# Patient Record
Sex: Male | Born: 1985 | Race: White | Hispanic: No | Marital: Single | Smoking: Never smoker
Health system: Southern US, Community
[De-identification: ages and names within clinical notes are randomized; demographics above are authoritative.]

## PROBLEM LIST (undated history)

## (undated) DIAGNOSIS — R569 Unspecified convulsions: Secondary | ICD-10-CM

## (undated) DIAGNOSIS — D696 Thrombocytopenia, unspecified: Secondary | ICD-10-CM

## (undated) DIAGNOSIS — K709 Alcoholic liver disease, unspecified: Secondary | ICD-10-CM

## (undated) DIAGNOSIS — T7840XA Allergy, unspecified, initial encounter: Secondary | ICD-10-CM

## (undated) DIAGNOSIS — K219 Gastro-esophageal reflux disease without esophagitis: Secondary | ICD-10-CM

## (undated) DIAGNOSIS — R7401 Elevation of levels of liver transaminase levels: Secondary | ICD-10-CM

## (undated) DIAGNOSIS — K859 Acute pancreatitis without necrosis or infection, unspecified: Secondary | ICD-10-CM

## (undated) DIAGNOSIS — G40909 Epilepsy, unspecified, not intractable, without status epilepticus: Secondary | ICD-10-CM

## (undated) DIAGNOSIS — K759 Inflammatory liver disease, unspecified: Secondary | ICD-10-CM

## (undated) DIAGNOSIS — R740 Nonspecific elevation of levels of transaminase and lactic acid dehydrogenase [LDH]: Secondary | ICD-10-CM

## (undated) DIAGNOSIS — F419 Anxiety disorder, unspecified: Secondary | ICD-10-CM

## (undated) HISTORY — DX: Allergy, unspecified, initial encounter: T78.40XA

## (undated) HISTORY — DX: Epilepsy, unspecified, not intractable, without status epilepticus: G40.909

## (undated) HISTORY — DX: Anxiety disorder, unspecified: F41.9

## (undated) HISTORY — PX: WISDOM TOOTH EXTRACTION: SHX21

---
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
## 2006-09-22 ENCOUNTER — Emergency Department (HOSPITAL_COMMUNITY): Admission: EM | Admit: 2006-09-22 | Discharge: 2006-09-22 | Payer: Self-pay | Admitting: Emergency Medicine

## 2011-05-10 ENCOUNTER — Encounter: Payer: Self-pay | Admitting: *Deleted

## 2011-05-10 ENCOUNTER — Ambulatory Visit (INDEPENDENT_AMBULATORY_CARE_PROVIDER_SITE_OTHER): Payer: BC Managed Care – PPO

## 2011-05-10 DIAGNOSIS — R569 Unspecified convulsions: Secondary | ICD-10-CM

## 2011-05-10 DIAGNOSIS — G40909 Epilepsy, unspecified, not intractable, without status epilepticus: Secondary | ICD-10-CM | POA: Insufficient documentation

## 2011-05-10 DIAGNOSIS — Z0000 Encounter for general adult medical examination without abnormal findings: Secondary | ICD-10-CM

## 2011-05-18 ENCOUNTER — Ambulatory Visit (INDEPENDENT_AMBULATORY_CARE_PROVIDER_SITE_OTHER): Payer: BC Managed Care – PPO

## 2011-05-18 DIAGNOSIS — R6889 Other general symptoms and signs: Secondary | ICD-10-CM

## 2011-05-18 DIAGNOSIS — R569 Unspecified convulsions: Secondary | ICD-10-CM

## 2011-06-28 ENCOUNTER — Telehealth: Payer: Self-pay

## 2011-06-28 NOTE — Telephone Encounter (Signed)
.  [facility] FROM [place] STATES THEY NEED RECORDS FAXED ON PT. PLEASE FAX TO [phone] AND THE PHONE NUMBER IS [phone]

## 2011-06-28 NOTE — Telephone Encounter (Signed)
Records sent to [facility]. Pt was referred by Dr Cleta Alberts. Spoke with Karlyne Greenspan and records received.

## 2011-06-29 ENCOUNTER — Ambulatory Visit: Payer: Self-pay | Admitting: Cardiology

## 2011-11-20 ENCOUNTER — Emergency Department (HOSPITAL_COMMUNITY)
Admission: EM | Admit: 2011-11-20 | Discharge: 2011-11-20 | Disposition: A | Discharge status: Home / Self Care | Payer: BC Managed Care – PPO | Attending: Emergency Medicine | Admitting: Emergency Medicine

## 2011-11-20 ENCOUNTER — Encounter (HOSPITAL_COMMUNITY): Payer: Self-pay | Admitting: Emergency Medicine

## 2011-11-20 DIAGNOSIS — R569 Unspecified convulsions: Secondary | ICD-10-CM | POA: Insufficient documentation

## 2011-11-20 LAB — POCT I-STAT, CHEM 8
BUN: 10 mg/dL (ref 6–23)
Calcium, Ion: 1.14 mmol/L (ref 1.12–1.23)
Chloride: 101 mEq/L (ref 96–112)
Creatinine, Ser: 0.8 mg/dL (ref 0.50–1.35)
Glucose, Bld: 170 mg/dL — ABNORMAL HIGH (ref 70–99)
HCT: 42 % (ref 39.0–52.0)
Hemoglobin: 14.3 g/dL (ref 13.0–17.0)
Potassium: 3.4 mEq/L — ABNORMAL LOW (ref 3.5–5.1)
Sodium: 138 mEq/L (ref 135–145)
TCO2: 20 mmol/L (ref 0–100)

## 2011-11-20 NOTE — ED Notes (Signed)
ZOX:[id number]> Expected date:<BR> Expected time:<BR> Means of arrival:<BR> Comments:<BR> 26 yo male/seizure

## 2011-11-20 NOTE — ED Notes (Signed)
Seizure described as lasting 2 minutes, upper body had grand mal type motor activity, lower body petit mal type motor activity. No incontinence, short post-ictal period, short period of confusion. Was clear when loaded into ambulance. Pt has a hand tremor which is unrelated to seizure activity.

## 2011-11-20 NOTE — ED Notes (Signed)
Immediately PTA pt had seizure at place of employment. Endorses hx of seizures w/ first occuring Aug 2012 another Dec 2012. Takes only Keppra for seizures and has telephone programed to remind him when to take meds. CBG on scene 153, BP-132/94, P-112, O2 SAT 99% on 2L/.

## 2011-11-20 NOTE — ED Notes (Signed)
Discharge instructions reviewed w/ pt., verbalizes understanding. No prescriptions provided at discharge. 

## 2011-11-21 NOTE — ED Notes (Signed)
Pt called to get a "return to work note".  I instructed him to follow up with his neurologist or come back to ER for MD eval to return to work.

## 2011-11-22 ENCOUNTER — Telehealth: Payer: Self-pay

## 2011-11-22 NOTE — Telephone Encounter (Signed)
DR DAUB,   PTS MOM CALLED TO SAY SON NEEDS LETTER STATING IT IS OK FOR HIM TO WORK SINCE HAVING SEIZURE??   BEST PHONE [phone]

## 2011-11-23 NOTE — Telephone Encounter (Signed)
The patient's mother at [phone] and ask her to have Oak come in to see me whenever I'm here and I can talk to him a whatever, documentation he needs for work regarding his seizure disorder.

## 2011-11-23 NOTE — ED Provider Notes (Addendum)
History    26 year old male with seizure. Patient's for seizure in the past year. No prior seizure history before this. Is on Keppra and reports compliance. An hour prior to arrival patient was witnessed to have generalized tonic-clonic movements. Last approximately 2-3 minutes. Tired and confused afterwards but currently back to his baseline per family. Denies ingestion. No incontinence or tongue biting. Previous evaluation including MRI and EEG without clear etiology. Has neurologist.  CSN: [id number]  Arrival date & time 11/20/11  2032   First MD Initiated Contact with Patient 11/20/11 2108      Chief Complaint  Patient presents with  . Seizures    (Consider location/radiation/quality/duration/timing/severity/associated sxs/prior treatment) HPI  Past Medical History  Diagnosis Date  . Seizure disorder     History reviewed. No pertinent past surgical history.  Family History  Problem Relation Age of Onset  . Heart disease      cardiovascular disease  . Cancer      History  Substance Use Topics  . Smoking status: Never Smoker   . Smokeless tobacco: Not on file  . Alcohol Use: Yes     socially      Review of Systems   Review of symptoms negative unless otherwise noted in HPI.   Allergies  Oxycodone  Home Medications   Current Outpatient Rx  Name Route Sig Dispense Refill  . LEVETIRACETAM 500 MG PO TABS Oral Take 500 mg by mouth 2 (two) times daily.    . ADULT MULTIVITAMIN W/MINERALS CH Oral Take 1 tablet by mouth daily.      BP 133/91  Pulse 99  Temp 99.8 F (37.7 C) (Oral)  Resp 23  SpO2 99%  Physical Exam  Nursing note and vitals reviewed. Constitutional: He is oriented to person, place, and time. He appears well-developed and well-nourished. No distress.  HENT:  Head: Normocephalic and atraumatic.  Eyes: Conjunctivae and EOM are normal. Pupils are equal, round, and reactive to light. Right eye exhibits no discharge. Left eye exhibits no  discharge.  Neck: Normal range of motion. Neck supple.  Cardiovascular: Normal rate, regular rhythm and normal heart sounds.  Exam reveals no gallop and no friction rub.   No murmur heard. Pulmonary/Chest: Effort normal and breath sounds normal. No respiratory distress.  Abdominal: Soft. He exhibits no distension. There is no tenderness.  Musculoskeletal: He exhibits no edema and no tenderness.  Neurological: He is alert and oriented to person, place, and time. No cranial nerve deficit. He exhibits normal muscle tone. Coordination normal.       Good finger to nose testing bilaterally. Gait is steady.  Skin: Skin is warm and dry.  Psychiatric: He has a normal mood and affect. His behavior is normal. Thought content normal.    ED Course  Procedures (including critical care time)  Labs Reviewed  POCT I-STAT, CHEM 8 - Abnormal; Notable for the following:    Potassium 3.4 (*)     Glucose, Bld 170 (*)     All other components within normal limits  LAB REPORT - SCANNED   No results found.  EKG:  Rhythm: normal sinus Rate: 91 Axis: normal Intervals: normal ST segments: normal  1. Seizure       MDM  26yM with seizure. Back to baseline. Nonfocal neuro exam. Afebrile and well appearing. Lytes ok.         Raeford Razor, MD 11/23/11 2536  Raeford Razor, MD 12/18/11 1255

## 2012-03-14 ENCOUNTER — Other Ambulatory Visit: Payer: Self-pay | Admitting: Emergency Medicine

## 2012-03-14 NOTE — Telephone Encounter (Signed)
Patients chart is at the nurses station in the pa pool pile.  [facility] [id number]

## 2012-03-14 NOTE — Telephone Encounter (Signed)
Please pull paper chart.  

## 2012-04-28 ENCOUNTER — Other Ambulatory Visit: Payer: Self-pay | Admitting: Emergency Medicine

## 2012-04-29 NOTE — Telephone Encounter (Signed)
This patient's paper chart is reviewed. Last visit here was 05/18/2011, CPE 05/10/2011.  The patient was referred to neurology at [facility] and cardiology. WE did not authorize request to refill this medication 03/14/2012.  Please call patient and advise him that we cannot refill this medication.  He needs to see a neurologist.  We is welcome to come back in and discuss this if he needs help finding a specialist.

## 2012-04-30 NOTE — Telephone Encounter (Signed)
Called pt to advise but our contact numbers were not accurate denial sent to pharmacy

## 2012-05-09 ENCOUNTER — Emergency Department (HOSPITAL_COMMUNITY): Payer: BC Managed Care – PPO

## 2012-05-09 ENCOUNTER — Inpatient Hospital Stay (HOSPITAL_COMMUNITY)
Admission: EM | Admit: 2012-05-09 | Discharge: 2012-05-12 | DRG: 204 | Disposition: A | Discharge status: Home / Self Care | Payer: BC Managed Care – PPO | Attending: Internal Medicine | Admitting: Internal Medicine

## 2012-05-09 ENCOUNTER — Encounter (HOSPITAL_COMMUNITY): Payer: Self-pay

## 2012-05-09 DIAGNOSIS — F1010 Alcohol abuse, uncomplicated: Secondary | ICD-10-CM | POA: Diagnosis present

## 2012-05-09 DIAGNOSIS — D6959 Other secondary thrombocytopenia: Secondary | ICD-10-CM | POA: Diagnosis not present

## 2012-05-09 DIAGNOSIS — F10939 Alcohol use, unspecified with withdrawal, unspecified: Secondary | ICD-10-CM | POA: Diagnosis present

## 2012-05-09 DIAGNOSIS — K709 Alcoholic liver disease, unspecified: Secondary | ICD-10-CM | POA: Diagnosis present

## 2012-05-09 DIAGNOSIS — F10239 Alcohol dependence with withdrawal, unspecified: Secondary | ICD-10-CM

## 2012-05-09 DIAGNOSIS — F1020 Alcohol dependence, uncomplicated: Secondary | ICD-10-CM | POA: Diagnosis present

## 2012-05-09 DIAGNOSIS — K219 Gastro-esophageal reflux disease without esophagitis: Secondary | ICD-10-CM | POA: Diagnosis present

## 2012-05-09 DIAGNOSIS — Z885 Allergy status to narcotic agent status: Secondary | ICD-10-CM

## 2012-05-09 DIAGNOSIS — G40909 Epilepsy, unspecified, not intractable, without status epilepticus: Secondary | ICD-10-CM | POA: Diagnosis present

## 2012-05-09 DIAGNOSIS — D696 Thrombocytopenia, unspecified: Secondary | ICD-10-CM | POA: Diagnosis present

## 2012-05-09 DIAGNOSIS — Z79899 Other long term (current) drug therapy: Secondary | ICD-10-CM

## 2012-05-09 DIAGNOSIS — K859 Acute pancreatitis without necrosis or infection, unspecified: Principal | ICD-10-CM | POA: Diagnosis present

## 2012-05-09 DIAGNOSIS — K560 Paralytic ileus: Secondary | ICD-10-CM | POA: Diagnosis not present

## 2012-05-09 HISTORY — DX: Unspecified convulsions: R56.9

## 2012-05-09 LAB — COMPREHENSIVE METABOLIC PANEL
AST: 215 U/L — ABNORMAL HIGH (ref 0–37)
Albumin: 3.9 g/dL (ref 3.5–5.2)
Calcium: 8.9 mg/dL (ref 8.4–10.5)
Chloride: 93 mEq/L — ABNORMAL LOW (ref 96–112)
Creatinine, Ser: 0.66 mg/dL (ref 0.50–1.35)

## 2012-05-09 LAB — URINALYSIS, ROUTINE W REFLEX MICROSCOPIC
Glucose, UA: NEGATIVE mg/dL
Leukocytes, UA: NEGATIVE
pH: 5.5 (ref 5.0–8.0)

## 2012-05-09 LAB — CBC
MCH: 37.4 pg — ABNORMAL HIGH (ref 26.0–34.0)
MCV: 98.2 fL (ref 78.0–100.0)
Platelets: 206 10*3/uL (ref 150–400)
RDW: 12.2 % (ref 11.5–15.5)
WBC: 15.9 10*3/uL — ABNORMAL HIGH (ref 4.0–10.5)

## 2012-05-09 LAB — HEMOGLOBIN A1C: Hgb A1c MFr Bld: 5.2 % (ref <5.7)

## 2012-05-09 LAB — GLUCOSE, CAPILLARY

## 2012-05-09 MED ORDER — ADULT MULTIVITAMIN W/MINERALS CH
1.0000 | ORAL_TABLET | Freq: Every day | ORAL | Status: DC
  Administered 2012-05-09 – 2012-05-12 (×4): 1 via ORAL
  Filled 2012-05-09 (×4): qty 1

## 2012-05-09 MED ORDER — MORPHINE SULFATE 2 MG/ML IJ SOLN
2.0000 mg | INTRAMUSCULAR | Status: DC | PRN
  Administered 2012-05-09 – 2012-05-10 (×5): 2 mg via INTRAVENOUS
  Filled 2012-05-09 (×5): qty 1

## 2012-05-09 MED ORDER — FENTANYL CITRATE 0.05 MG/ML IJ SOLN
50.0000 ug | Freq: Once | INTRAMUSCULAR | Status: AC
  Administered 2012-05-09: 50 ug via INTRAVENOUS
  Filled 2012-05-09: qty 2

## 2012-05-09 MED ORDER — ONDANSETRON HCL 4 MG/2ML IJ SOLN: 4.0000 mg | Freq: Four times a day (QID) | INTRAMUSCULAR | Status: DC | PRN

## 2012-05-09 MED ORDER — LORAZEPAM 2 MG/ML IJ SOLN
INTRAMUSCULAR | Status: AC
  Administered 2012-05-09: 12:00:00
  Filled 2012-05-09: qty 1

## 2012-05-09 MED ORDER — SODIUM CHLORIDE 0.9 % IV BOLUS (SEPSIS)
1000.0000 mL | Freq: Once | INTRAVENOUS | Status: AC
  Administered 2012-05-09: 1000 mL via INTRAVENOUS

## 2012-05-09 MED ORDER — ONDANSETRON HCL 4 MG/2ML IJ SOLN
4.0000 mg | Freq: Once | INTRAMUSCULAR | Status: AC
  Administered 2012-05-09: 4 mg via INTRAVENOUS
  Filled 2012-05-09: qty 2

## 2012-05-09 MED ORDER — POTASSIUM CHLORIDE CRYS ER 20 MEQ PO TBCR
40.0000 meq | EXTENDED_RELEASE_TABLET | Freq: Once | ORAL | Status: AC
  Administered 2012-05-09: 40 meq via ORAL
  Filled 2012-05-09: qty 1

## 2012-05-09 MED ORDER — IOHEXOL 300 MG/ML  SOLN
100.0000 mL | Freq: Once | INTRAMUSCULAR | Status: AC | PRN
  Administered 2012-05-09: 100 mL via INTRAVENOUS

## 2012-05-09 MED ORDER — ONDANSETRON HCL 4 MG PO TABS: 4.0000 mg | ORAL_TABLET | Freq: Four times a day (QID) | ORAL | Status: DC | PRN

## 2012-05-09 MED ORDER — FOLIC ACID 1 MG PO TABS
1.0000 mg | ORAL_TABLET | Freq: Every day | ORAL | Status: DC
  Administered 2012-05-09 – 2012-05-12 (×4): 1 mg via ORAL
  Filled 2012-05-09 (×4): qty 1

## 2012-05-09 MED ORDER — THIAMINE HCL 100 MG/ML IJ SOLN
100.0000 mg | Freq: Every day | INTRAMUSCULAR | Status: DC
  Administered 2012-05-09: 100 mg via INTRAVENOUS
  Filled 2012-05-09 (×4): qty 1

## 2012-05-09 MED ORDER — VITAMIN B-1 100 MG PO TABS
100.0000 mg | ORAL_TABLET | Freq: Every day | ORAL | Status: DC
  Administered 2012-05-10 – 2012-05-12 (×3): 100 mg via ORAL
  Filled 2012-05-09 (×4): qty 1

## 2012-05-09 MED ORDER — LEVETIRACETAM 500 MG PO TABS
500.0000 mg | ORAL_TABLET | Freq: Two times a day (BID) | ORAL | Status: DC
  Administered 2012-05-10 – 2012-05-12 (×5): 500 mg via ORAL
  Filled 2012-05-09 (×8): qty 1

## 2012-05-09 MED ORDER — FENTANYL CITRATE 0.05 MG/ML IJ SOLN
25.0000 ug | Freq: Once | INTRAMUSCULAR | Status: AC
  Administered 2012-05-09: 25 ug via INTRAVENOUS
  Filled 2012-05-09: qty 2

## 2012-05-09 MED ORDER — PROMETHAZINE HCL 25 MG/ML IJ SOLN
25.0000 mg | Freq: Once | INTRAMUSCULAR | Status: AC
  Administered 2012-05-09: 25 mg via INTRAVENOUS
  Filled 2012-05-09: qty 1

## 2012-05-09 MED ORDER — LORAZEPAM 2 MG/ML IJ SOLN
1.0000 mg | Freq: Four times a day (QID) | INTRAMUSCULAR | Status: AC | PRN
  Administered 2012-05-09: 1 mg via INTRAVENOUS
  Filled 2012-05-09: qty 1

## 2012-05-09 MED ORDER — LORAZEPAM 1 MG PO TABS
1.0000 mg | ORAL_TABLET | Freq: Four times a day (QID) | ORAL | Status: AC | PRN
  Filled 2012-05-09: qty 1

## 2012-05-09 MED ORDER — SODIUM CHLORIDE 0.9 % IV BOLUS (SEPSIS)
500.0000 mL | Freq: Once | INTRAVENOUS | Status: AC
  Administered 2012-05-09: 500 mL via INTRAVENOUS

## 2012-05-09 MED ORDER — KCL IN DEXTROSE-NACL 20-5-0.45 MEQ/L-%-% IV SOLN
INTRAVENOUS | Status: DC
Start: 2012-05-09 — End: 2012-05-12
  Administered 2012-05-09 – 2012-05-12 (×4): via INTRAVENOUS
  Filled 2012-05-09 (×11): qty 1000

## 2012-05-09 MED ORDER — FENTANYL CITRATE 0.05 MG/ML IJ SOLN: 25.0000 ug | INTRAMUSCULAR | Status: DC | PRN

## 2012-05-09 MED ORDER — PROMETHAZINE HCL 25 MG/ML IJ SOLN
25.0000 mg | Freq: Once | INTRAMUSCULAR | Status: AC
  Administered 2012-05-09: 25 mg via INTRAMUSCULAR
  Filled 2012-05-09: qty 1

## 2012-05-09 NOTE — ED Notes (Signed)
Report given-transfer to [id number]

## 2012-05-09 NOTE — ED Notes (Signed)
NSL PIV.

## 2012-05-09 NOTE — ED Notes (Signed)
Attempted to call report

## 2012-05-09 NOTE — ED Notes (Signed)
Patient sleeping soundly, arousable-parents at bedside-will continue to monitor

## 2012-05-09 NOTE — ED Provider Notes (Signed)
History     CSN: [id number]  Arrival date & time 05/09/12  0458   First MD Initiated Contact with Patient 05/09/12 [phone]      Chief Complaint  Patient presents with  . Emesis  . Abdominal Pain    (Consider location/radiation/quality/duration/timing/severity/associated sxs/prior treatment) HPI Hx per PT, onset tonight N/V and epigastric pain, sharp in quality, intermittent, nonradiating moderate pain. No sick contacts or h/o same, no recent travel, no F/C, no previous surgeries or GB problems.  Past Medical History  Diagnosis Date  . Seizure disorder   . Seizures     History reviewed. No pertinent past surgical history.  Family History  Problem Relation Age of Onset  . Heart disease      cardiovascular disease  . Cancer      History  Substance Use Topics  . Smoking status: Never Smoker   . Smokeless tobacco: Not on file  . Alcohol Use: Yes     Comment: socially      Review of Systems  Constitutional: Negative for fever and chills.  HENT: Negative for neck pain and neck stiffness.   Eyes: Negative for pain.  Respiratory: Negative for shortness of breath.   Cardiovascular: Negative for chest pain.  Gastrointestinal: Positive for nausea, vomiting and abdominal pain.  Genitourinary: Negative for dysuria.  Musculoskeletal: Negative for back pain.  Skin: Negative for rash.  Neurological: Negative for headaches.  All other systems reviewed and are negative.    Allergies  Oxycodone  Home Medications   Current Outpatient Rx  Name  Route  Sig  Dispense  Refill  . LEVETIRACETAM 500 MG PO TABS   Oral   Take 500 mg by mouth every 12 (twelve) hours.            BP 146/86  Pulse 98  Temp 98.9 F (37.2 C) (Oral)  Resp 22  SpO2 99%  Physical Exam  Constitutional: He is oriented to person, place, and time. He appears well-developed and well-nourished.  HENT:  Head: Normocephalic and atraumatic.  Eyes: EOM are normal. Pupils are equal, round, and reactive  to light. No scleral icterus.  Neck: Neck supple.  Cardiovascular: Regular rhythm and intact distal pulses.        tachy  Pulmonary/Chest: Effort normal. No respiratory distress.  Abdominal: Soft. Bowel sounds are normal. He exhibits no distension. There is no rebound and no guarding.       Epigastric TTP  Musculoskeletal: Normal range of motion. He exhibits no edema.  Neurological: He is alert and oriented to person, place, and time.  Skin: Skin is warm and dry.    ED Course  Procedures (including critical care time)  IVFs. IV zofran. IM phenergan for persistent emesis. Labs sent and PT care Tx to Dr Silverio Lay at 7am.    MDM          Sunnie Nielsen, MD 05/09/12 [phone]

## 2012-05-09 NOTE — ED Provider Notes (Signed)
Care assumed at sign out. Eric Mueller is a 27 y.o. male here with vomiting. He is chronic alcoholic and drank a lot since the holidays. Lipase 800. CT ab/pel showed pancreatitis with free fluid. WBC 15. Will admit for pancreatitis. I called hospitalist, who will see the patient in the ED.   Results for orders placed during the hospital encounter of 05/09/12  CBC      Component Value Range   WBC 15.9 (*) 4.0 - 10.5 K/uL   RBC 4.89  4.22 - 5.81 MIL/uL   Hemoglobin 18.3 (*) 13.0 - 17.0 g/dL   HCT 13.0  86.5 - 78.4 %   MCV 98.2  78.0 - 100.0 fL   MCH 37.4 (*) 26.0 - 34.0 pg   MCHC 38.1 (*) 30.0 - 36.0 g/dL   RDW 69.6  29.5 - 28.4 %   Platelets 206  150 - 400 K/uL  COMPREHENSIVE METABOLIC PANEL      Component Value Range   Sodium 139  135 - 145 mEq/L   Potassium 3.2 (*) 3.5 - 5.1 mEq/L   Chloride 93 (*) 96 - 112 mEq/L   CO2 25  19 - 32 mEq/L   Glucose, Bld 161 (*) 70 - 99 mg/dL   BUN 6  6 - 23 mg/dL   Creatinine, Ser 1.32  0.50 - 1.35 mg/dL   Calcium 8.9  8.4 - 44.0 mg/dL   Total Protein 7.2  6.0 - 8.3 g/dL   Albumin 3.9  3.5 - 5.2 g/dL   AST 102 (*) 0 - 37 U/L   ALT 140 (*) 0 - 53 U/L   Alkaline Phosphatase 106  39 - 117 U/L   Total Bilirubin 2.4 (*) 0.3 - 1.2 mg/dL   GFR calc non Af Amer >90  >90 mL/min   GFR calc Af Amer >90  >90 mL/min  LIPASE, BLOOD      Component Value Range   Lipase 799 (*) 11 - 59 U/L   Ct Abdomen Pelvis W Contrast  05/09/2012  *RADIOLOGY REPORT*  Clinical Data: 1-day history of mid abdominal pain and nausea and vomiting.  Elevated serum lipase.  CT ABDOMEN AND PELVIS WITH CONTRAST  Technique:  Multidetector CT imaging of the abdomen and pelvis was performed following the standard protocol during bolus administration of intravenous contrast.  Contrast: OMNIPAQUE IOHEXOL 300 MG/ML. Oral contrast was also administered.  Comparison: None.  Findings: Edema/inflammation surrounding the body and tail of the pancreas.  Free fluid tracks inferiorly in the  left anterior pararenal space and in the left paracolic gutter, and there is a small amount of free fluid dependently in the pelvis.  Pancreas enhances throughout, without evidence of pancreatic necrosis.  No evidence of pseudocyst formation.  Severe diffuse hepatic steatosis without focal hepatic parenchymal abnormality.  Normal-appearing spleen, adrenal glands, kidneys, and gallbladder.  No biliary ductal dilation.  No visible aorto- iliofemoral atherosclerosis.  No significant lymphadenopathy.  Very small hiatal hernia with oral contrast material in the distal esophagus.  Stomach otherwise decompressed and unremarkable. Borderline distention of several loops of jejunum in the left upper quadrant consistent with a reflex ileus; remainder of the small bowel normal in appearance.  Normal-appearing colon.  Normal- appearing decompressed appendix in the right mid upper pelvis.  Urinary bladder decompressed and unremarkable.  Prostate gland and seminal vesicles normal for age.  Phleboliths in the left side of the pelvis.  Bone window images unremarkable.  Visualized lung bases clear.  IMPRESSION:  1.  Acute pancreatitis with complicating features. 2.  Small amount of free fluid in the left anterior pararenal space, left paracolic gutter, and dependently in the pelvis. 3.  Severe diffuse hepatic steatosis. 4.  Small hiatal hernia.  Oral contrast material in the esophagus is consistent with acute reflux and/or dysmotility. 5.  Mildly distended loops of jejunum in the left upper quadrant consistent with a reflex ileus.  No evidence of bowel obstruction.   Original Report Authenticated By: Hulan Saas, M.D.        Richardean Canal, MD 05/09/12 1014

## 2012-05-09 NOTE — H&P (Signed)
[facility] History and Physical  LATROY GAYMON ZOX:[id number] DOB: [birth year] DOA: 05/09/2012  Referring physician: Silverio Lay PCP: No primary provider on file.  Specialists: none  Chief Complaint: abdominal discomfort  HPI: Eric Mueller is a 27 y.o. male  With history of seizures. Presenting to the ED with 2 days complaint of abdominal discomfort.  Problem is persistent and has gradually got worse. Ginger ale has helped his discomfort feel better but nothing that he is aware of has made it feel worse.  His abdominal discomfort is mainly at the top part of his abdomen.  He reports that the night before he drank alcohol specifically whiskey.  Mother reports that he has been drinking on regular occasion every night.  Patient denies feeling anxious or difficulties if he does not drink alcohol everyday.  He states that he had an upper respiratory infection several times this fall and winter.  In the ED patient was found to have a lipase of 799 and we were consulted for further evaluation and recommendations for admission  Review of Systems: The patient denies anorexia, fever, weight loss,, vision loss, decreased hearing, hoarseness, chest pain, syncope, dyspnea on exertion, peripheral edema, balance deficits, hemoptysis, abdominal pain, melena, hematochezia, severe indigestion/heartburn, hematuria, incontinence, genital sores, muscle weakness, suspicious skin lesions, transient blindness, difficulty walking, depression, unusual weight change, abnormal bleeding, enlarged lymph nodes, angioedema, and breast masses.    Past Medical History  Diagnosis Date  . Seizure disorder   . Seizures    History reviewed. No pertinent past surgical history. Social History:  reports that he has never smoked. He does not have any smokeless tobacco history on file. He reports that he drinks alcohol. He reports that he does not use illicit drugs. Lives at home  Can patient participate in ADLs? yes  Allergies    Allergen Reactions  . Oxycodone Nausea And Vomiting    Family History  Problem Relation Age of Onset  . Heart disease      cardiovascular disease  . Cancer    None reported when asked directly   Prior to Admission medications   Medication Sig Start Date End Date Taking? Authorizing Provider  levETIRAcetam (KEPPRA) 500 MG tablet Take 500 mg by mouth every 12 (twelve) hours.    Yes Historical Provider, MD   Physical Exam: Filed Vitals:   05/09/12 8119 05/09/12 0621 05/09/12 0728 05/09/12 0730  BP: 142/92 146/86 146/97   Pulse: 80 98 100 100  Temp:   98 F (36.7 C)   TempSrc:   Oral   Resp: 24 22  21   SpO2: 99% 99% 95% 95%     General:  Pt in NAD, Alert and Awake, at times looks uncomfortable  Eyes: EOMI, non icteric  ENT: No masses on visual inspection, moist mucous membranes  Neck: supple, no goiter  Cardiovascular: RRR, No MRG  Respiratory: Clear to auscultation, no wheezes  Abdomen: soft, NT, ND  Skin: warm and dry  Musculoskeletal: no cyanosis or clubbing  Psychiatric: mood and affect appropriate  Neurologic: moves all extremities, answers questions appropriately, no facial asymmetry  Labs on Admission:  Basic Metabolic Panel:  Lab 05/09/12 1478  NA 139  K 3.2*  CL 93*  CO2 25  GLUCOSE 161*  BUN 6  CREATININE 0.66  CALCIUM 8.9  MG --  PHOS --   Liver Function Tests:  Lab 05/09/12 0610  AST 215*  ALT 140*  ALKPHOS 106  BILITOT 2.4*  PROT 7.2  ALBUMIN 3.9  Lab 05/09/12 0643  LIPASE 799*  AMYLASE --   No results found for this basename: AMMONIA:5 in the last 168 hours CBC:  Lab 05/09/12 0610  WBC 15.9*  NEUTROABS --  HGB 18.3*  HCT 48.0  MCV 98.2  PLT 206   Cardiac Enzymes: No results found for this basename: CKTOTAL:5,CKMB:5,CKMBINDEX:5,TROPONINI:5 in the last 168 hours  BNP (last 3 results) No results found for this basename: PROBNP:3 in the last 8760 hours CBG: No results found for this basename: GLUCAP:5 in the  last 168 hours  Radiological Exams on Admission: Ct Abdomen Pelvis W Contrast  05/09/2012  *RADIOLOGY REPORT*  Clinical Data: 1-day history of mid abdominal pain and nausea and vomiting.  Elevated serum lipase.  CT ABDOMEN AND PELVIS WITH CONTRAST  Technique:  Multidetector CT imaging of the abdomen and pelvis was performed following the standard protocol during bolus administration of intravenous contrast.  Contrast: OMNIPAQUE IOHEXOL 300 MG/ML. Oral contrast was also administered.  Comparison: None.  Findings: Edema/inflammation surrounding the body and tail of the pancreas.  Free fluid tracks inferiorly in the left anterior pararenal space and in the left paracolic gutter, and there is a small amount of free fluid dependently in the pelvis.  Pancreas enhances throughout, without evidence of pancreatic necrosis.  No evidence of pseudocyst formation.  Severe diffuse hepatic steatosis without focal hepatic parenchymal abnormality.  Normal-appearing spleen, adrenal glands, kidneys, and gallbladder.  No biliary ductal dilation.  No visible aorto- iliofemoral atherosclerosis.  No significant lymphadenopathy.  Very small hiatal hernia with oral contrast material in the distal esophagus.  Stomach otherwise decompressed and unremarkable. Borderline distention of several loops of jejunum in the left upper quadrant consistent with a reflex ileus; remainder of the small bowel normal in appearance.  Normal-appearing colon.  Normal- appearing decompressed appendix in the right mid upper pelvis.  Urinary bladder decompressed and unremarkable.  Prostate gland and seminal vesicles normal for age.  Phleboliths in the left side of the pelvis.  Bone window images unremarkable.  Visualized lung bases clear.  IMPRESSION:  1.  Acute pancreatitis with complicating features. 2.  Small amount of free fluid in the left anterior pararenal space, left paracolic gutter, and dependently in the pelvis. 3.  Severe diffuse hepatic  steatosis. 4.  Small hiatal hernia.  Oral contrast material in the esophagus is consistent with acute reflux and/or dysmotility. 5.  Mildly distended loops of jejunum in the left upper quadrant consistent with a reflex ileus.  No evidence of bowel obstruction.   Original Report Authenticated By: Hulan Saas, M.D.      Assessment/Plan Principal Problem:  *Acute pancreatitis Active Problems:  Seizure disorder   1. Acute pancreatitis - NPO - MIVF's - advance diet with improvement of condition - Most likely related to recent alcohol intake - Have discussed cessation or modification of patient's alcohol intake - Monitor lipase levels  2. Seizure disorder - Continue home medication while in house   Code Status: full  Family Communication: spoke with patient and wife at bedside Disposition Plan: *Pending further clinical improvement. Likely d/c in 1-2 days  Time spent: > 55 minutes  Penny Pia [facility] Pager [phone]  If 7PM-7AM, please contact night-coverage [email or web address] Password [facility] 05/09/2012, 10:55 AM

## 2012-05-09 NOTE — ED Notes (Signed)
Request for pain meds to Dr Cena Benton

## 2012-05-09 NOTE — Progress Notes (Signed)
Nutrition Brief Note  Patient identified on the Malnutrition Screening Tool (MST) Report  There is no height or weight on file to calculate BMI.   Current diet order is NPO. Labs and medications reviewed.   Pt with recent increased alcohol use admitted with abdominal pain.  Pt found to have acute pancreatitis with elevated lipase.  Currently NPO. Lipase     Component Value Date/Time   LIPASE 799* 05/09/2012 0643   Pt states he usually weighs 150 lbs.  Wt via bedscale is 69 kg.  Pt states he was eating per his usual PTA. Eating out with friends at a restaurant day before admission.  Notes some off and on queasiness around Christmas which he/family thought may be oncoming stomach bug, however no change in intake or weight. No nutrition interventions warranted at this time. Will monitor for diet advancement.  If nutrition issues arise, please consult RD.   Loyce Dys, MS RD LDN Clinical Inpatient Dietitian Pager: [phone] Weekend/After hours pager: [phone]

## 2012-05-09 NOTE — ED Notes (Signed)
Patient transported to CT 

## 2012-05-09 NOTE — ED Notes (Signed)
Per pt, vomiting and abdominal pain since yesterday afternoon.  Pt states he has been gagging d/t sinus drainage.  Pt feels that during gagging he felt a pull in mid abdomen.  Has had abdominal pain since then.  Vomited multiple times since yesterday afternoon and unable to keep anything down.  Took ibuprofen for pain.

## 2012-05-09 NOTE — ED Notes (Signed)
Observed pt resting with mother at bedside. VS WNL. Intermittent cramping pain of 4-6 in the mid-abdomen.

## 2012-05-10 ENCOUNTER — Inpatient Hospital Stay (HOSPITAL_COMMUNITY): Payer: BC Managed Care – PPO

## 2012-05-10 DIAGNOSIS — F10939 Alcohol use, unspecified with withdrawal, unspecified: Secondary | ICD-10-CM | POA: Diagnosis present

## 2012-05-10 DIAGNOSIS — F10239 Alcohol dependence with withdrawal, unspecified: Secondary | ICD-10-CM | POA: Diagnosis present

## 2012-05-10 DIAGNOSIS — F1010 Alcohol abuse, uncomplicated: Secondary | ICD-10-CM | POA: Diagnosis present

## 2012-05-10 LAB — LIPID PANEL
Cholesterol: 116 mg/dL (ref 0–200)
Total CHOL/HDL Ratio: 3.3 RATIO

## 2012-05-10 LAB — CBC
HCT: 41.9 % (ref 39.0–52.0)
MCHC: 36 g/dL (ref 30.0–36.0)
MCV: 101 fL — ABNORMAL HIGH (ref 78.0–100.0)
RDW: 12.6 % (ref 11.5–15.5)

## 2012-05-10 LAB — COMPREHENSIVE METABOLIC PANEL
Alkaline Phosphatase: 72 U/L (ref 39–117)
BUN: 4 mg/dL — ABNORMAL LOW (ref 6–23)
Calcium: 8.1 mg/dL — ABNORMAL LOW (ref 8.4–10.5)
GFR calc Af Amer: >90 mL/min (ref >90)
GFR calc non Af Amer: >90 mL/min (ref >90)
Glucose, Bld: 114 mg/dL — ABNORMAL HIGH (ref 70–99)
Potassium: 3.5 mEq/L (ref 3.5–5.1)
Total Protein: 5.5 g/dL — ABNORMAL LOW (ref 6.0–8.3)

## 2012-05-10 LAB — GLUCOSE, CAPILLARY: Glucose-Capillary: 135 mg/dL — ABNORMAL HIGH (ref 70–99)

## 2012-05-10 MED ORDER — IBUPROFEN 800 MG PO TABS: 400.0000 mg | ORAL_TABLET | Freq: Four times a day (QID) | ORAL | Status: DC | PRN

## 2012-05-10 MED ORDER — IBUPROFEN 200 MG PO TABS
200.0000 mg | ORAL_TABLET | Freq: Four times a day (QID) | ORAL | Status: DC | PRN
  Administered 2012-05-10: 200 mg via ORAL
  Filled 2012-05-10: qty 1

## 2012-05-10 MED ORDER — LORAZEPAM 2 MG/ML IJ SOLN
1.0000 mg | Freq: Three times a day (TID) | INTRAMUSCULAR | Status: DC
  Administered 2012-05-10 – 2012-05-11 (×4): 1 mg via INTRAVENOUS
  Filled 2012-05-10 (×4): qty 1

## 2012-05-10 MED ORDER — SODIUM CHLORIDE 0.9 % IV SOLN
500.0000 mg | Freq: Three times a day (TID) | INTRAVENOUS | Status: DC
  Administered 2012-05-10 – 2012-05-11 (×4): 500 mg via INTRAVENOUS
  Filled 2012-05-10 (×5): qty 500

## 2012-05-10 NOTE — Progress Notes (Signed)
ANTIBIOTIC CONSULT NOTE - INITIAL  Pharmacy Consult for Primaxin Indication: pancreatitis  Allergies  Allergen Reactions  . Oxycodone Nausea And Vomiting    Patient Measurements: Height: 5\' 10"  (177.8 cm) Weight: 152 lb 12.5 oz (69.3 kg) IBW/kg (Calculated) : 73   Vital Signs: Temp: 98 F (36.7 C) (01/11 0645) Temp src: Oral (01/11 0645) BP: 132/88 mmHg (01/11 0548) Pulse Rate: 97  (01/11 0548) Intake/Output from previous day: 01/10 0701 - 01/11 0700 In: 2271 [I.V.:2271] Out: 400 [Urine:400] Intake/Output from this shift:    Labs:  [facility] 05/10/12 0424 05/09/12 0610  WBC 17.8* 15.9*  HGB 15.1 18.3*  PLT 126* 206  LABCREA -- --  CREATININE 0.59 0.66   Estimated Creatinine Clearance: 136 ml/min (by C-G formula based on Cr of 0.59). No results found for this basename: VANCOTROUGH:2,VANCOPEAK:2,VANCORANDOM:2,GENTTROUGH:2,GENTPEAK:2,GENTRANDOM:2,TOBRATROUGH:2,TOBRAPEAK:2,TOBRARND:2,AMIKACINPEAK:2,AMIKACINTROU:2,AMIKACIN:2, in the last 72 hours   Microbiology: No results found for this or any previous visit (from the past 720 hour(s)).  Medical History: Past Medical History  Diagnosis Date  . Seizure disorder   . Seizures     Medications:  Scheduled:    . folic acid  1 mg Oral Daily  . levETIRAcetam  500 mg Oral Q12H  . [COMPLETED] LORazepam      . multivitamin with minerals  1 tablet Oral Daily  . [COMPLETED] sodium chloride  500 mL Intravenous Once  . thiamine  100 mg Oral Daily   Or  . thiamine  100 mg Intravenous Daily   Infusions:    . dextrose 5 % and 0.45 % NaCl with KCl 20 mEq/L 125 mL/hr at 05/09/12 2322   Assessment: 27 yo male admitted with CC abdominal discomfort found to have pancreatitis with fevers and elevated WBC's to start broad spectrum abx's with Primaxin  Goal of Therapy:  eradication of infection  Plan:  Primaxin 500mg  IV q8 for wt < 70kg and CrCl > 100 per dosing guidelines  Hessie Knows, PharmD, BCPS Pager  [phone] 05/10/2012 11:09 AM

## 2012-05-10 NOTE — Progress Notes (Signed)
Patient ID: Eric Mueller  male  ZOX:[id number]    DOB: [birth year]    DOA: 05/09/2012  PCP: No primary provider on file.  Assessment/Plan: Principal Problem:  *Acute pancreatitis: Likely scheduled to alcohol abuse, lipase down to 157 however patient still very nauseated even with ice chips. Triglycerides 69. Patient developing fever with worsening leukocytosis today - Continue aggressive IV fluid hydration, pain control, n.p.o., empirically start on imipenem IV. - If continues to worsen, repeat the CT scan to rule out any necrosis  Active Problems:  Seizure disorder: Continue Keppra   Alcohol abuse with Alcohol withdrawal - Patient is sweating, restless, anxious, tachycardia, tremulous, I placed him on scheduled Ativan besides PRN ativan with CIWA protocol - Continue multivitamin, thiamine, folic acid - Consultation on alcohol cessation   DVT Prophylaxis: SCDs  Code Status: Full code  Disposition: Will likely need 1 or 2 days until tolerating solid diet   Subjective: Patient shaky, anxious, father are at the bedside, states abdomen is still sore  Objective: Weight change:   Intake/Output Summary (Last 24 hours) at 05/10/12 1233 Last data filed at 05/10/12 1100  Gross per 24 hour  Intake   2271 ml  Output    400 ml  Net   1871 ml   Blood pressure 142/102, pulse 89, temperature 98 F (36.7 C), temperature source Oral, resp. rate 18, height 5\' 10"  (1.778 m), weight 69.3 kg (152 lb 12.5 oz), SpO2 97.00%.  Physical Exam: General: Alert and awake, oriented x3, not in any acute distress. HEENT: anicteric sclera, pupils reactive to light and accommodation, EOMI CVS: S1-S2 clear, no murmur rubs or gallops Chest: clear to auscultation bilaterally, no wheezing, rales or rhonchi Abdomen: soft, mild tenderness in the epigastric region, nondistended, normal bowel sounds Extremities: no cyanosis, clubbing or edema noted bilaterally Neuro: Cranial nerves II-XII intact, no focal  neurological deficits, tremulous  Lab Results: Basic Metabolic Panel:  Lab 05/10/12 8119 05/09/12 0610  NA 135 139  K 3.5 3.2*  CL 99 93*  CO2 27 25  GLUCOSE 114* 161*  BUN 4* 6  CREATININE 0.59 0.66  CALCIUM 8.1* 8.9  MG -- --  PHOS -- --   Liver Function Tests:  Lab 05/10/12 0424 05/09/12 0610  AST 91* 215*  ALT 78* 140*  ALKPHOS 72 106  BILITOT 4.2* 2.4*  PROT 5.5* 7.2  ALBUMIN 3.0* 3.9    Lab 05/10/12 0424 05/09/12 0643  LIPASE 151* 799*  AMYLASE -- --   No results found for this basename: AMMONIA:2 in the last 168 hours CBC:  Lab 05/10/12 0424 05/09/12 0610  WBC 17.8* 15.9*  NEUTROABS -- --  HGB 15.1 18.3*  HCT 41.9 48.0  MCV 101.0* 98.2  PLT 126* 206   Cardiac Enzymes: No results found for this basename: CKTOTAL:3,CKMB:3,CKMBINDEX:3,TROPONINI:3 in the last 168 hours BNP: No components found with this basename: POCBNP:2 CBG:  Lab 05/10/12 0756 05/09/12 2137 05/09/12 1828 05/09/12 1112  GLUCAP 123* 135* 144* 147*     Micro Results: No results found for this or any previous visit (from the past 240 hour(s)).  Studies/Results: Ct Abdomen Pelvis W Contrast  05/09/2012  *RADIOLOGY REPORT*  Clinical Data: 1-day history of mid abdominal pain and nausea and vomiting.  Elevated serum lipase.  CT ABDOMEN AND PELVIS WITH CONTRAST  Technique:  Multidetector CT imaging of the abdomen and pelvis was performed following the standard protocol during bolus administration of intravenous contrast.  Contrast: OMNIPAQUE IOHEXOL 300 MG/ML. Oral contrast was  also administered.  Comparison: None.  Findings: Edema/inflammation surrounding the body and tail of the pancreas.  Free fluid tracks inferiorly in the left anterior pararenal space and in the left paracolic gutter, and there is a small amount of free fluid dependently in the pelvis.  Pancreas enhances throughout, without evidence of pancreatic necrosis.  No evidence of pseudocyst formation.  Severe diffuse hepatic  steatosis without focal hepatic parenchymal abnormality.  Normal-appearing spleen, adrenal glands, kidneys, and gallbladder.  No biliary ductal dilation.  No visible aorto- iliofemoral atherosclerosis.  No significant lymphadenopathy.  Very small hiatal hernia with oral contrast material in the distal esophagus.  Stomach otherwise decompressed and unremarkable. Borderline distention of several loops of jejunum in the left upper quadrant consistent with a reflex ileus; remainder of the small bowel normal in appearance.  Normal-appearing colon.  Normal- appearing decompressed appendix in the right mid upper pelvis.  Urinary bladder decompressed and unremarkable.  Prostate gland and seminal vesicles normal for age.  Phleboliths in the left side of the pelvis.  Bone window images unremarkable.  Visualized lung bases clear.  IMPRESSION:  1.  Acute pancreatitis with complicating features. 2.  Small amount of free fluid in the left anterior pararenal space, left paracolic gutter, and dependently in the pelvis. 3.  Severe diffuse hepatic steatosis. 4.  Small hiatal hernia.  Oral contrast material in the esophagus is consistent with acute reflux and/or dysmotility. 5.  Mildly distended loops of jejunum in the left upper quadrant consistent with a reflex ileus.  No evidence of bowel obstruction.   Original Report Authenticated By: Hulan Saas, M.D.    Dg Chest Port 1 View  05/10/2012  *RADIOLOGY REPORT*  Clinical Data: Chest pain with cough.  Cigarette smoker.Fever.  PORTABLE CHEST - 1 VIEW  Comparison: None.  Findings:  Cardiopericardial silhouette within normal limits. Mediastinal contours normal. Trachea midline.  No airspace disease or effusion.  IMPRESSION: No active cardiopulmonary disease.   Original Report Authenticated By: Andreas Newport, M.D.     Medications: Scheduled Meds:   . folic acid  1 mg Oral Daily  . imipenem-cilastatin  500 mg Intravenous Q8H  . levETIRAcetam  500 mg Oral Q12H  . LORazepam   1 mg Intravenous Q8H  . multivitamin with minerals  1 tablet Oral Daily  . thiamine  100 mg Oral Daily   Or  . thiamine  100 mg Intravenous Daily      LOS: 1 day   Sharayah Renfrow M.D. [facility] 05/10/2012, 12:33 PM Pager: [phone]  If 7PM-7AM, please contact night-coverage [email or web address] Password [email or web address]

## 2012-05-10 NOTE — Progress Notes (Signed)
RN went to pts room around 2200 to give pt his dose of Keppra.  Mother of pt stated she already gave it to him from his home supply at 2000 because that's what time he usually takes it.  Mother informed that pt cannot take home supply of medications as we will supply him all of his medications while he is here.  Mother verbalized understanding.  Will continue to monitor.  Fatima Sanger A
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
## 2012-05-11 DIAGNOSIS — D696 Thrombocytopenia, unspecified: Secondary | ICD-10-CM | POA: Diagnosis present

## 2012-05-11 LAB — COMPREHENSIVE METABOLIC PANEL WITH GFR
ALT: 59 U/L — ABNORMAL HIGH (ref 0–53)
AST: 71 U/L — ABNORMAL HIGH (ref 0–37)
Albumin: 2.8 g/dL — ABNORMAL LOW (ref 3.5–5.2)
Alkaline Phosphatase: 68 U/L (ref 39–117)
BUN: 7 mg/dL (ref 6–23)
CO2: 27 meq/L (ref 19–32)
Calcium: 8.5 mg/dL (ref 8.4–10.5)
Chloride: 98 meq/L (ref 96–112)
Creatinine, Ser: 0.69 mg/dL (ref 0.50–1.35)
GFR calc Af Amer: >90 mL/min
GFR calc non Af Amer: >90 mL/min
Glucose, Bld: 104 mg/dL — ABNORMAL HIGH (ref 70–99)
Potassium: 3.8 meq/L (ref 3.5–5.1)
Sodium: 134 meq/L — ABNORMAL LOW (ref 135–145)
Total Bilirubin: 5 mg/dL — ABNORMAL HIGH (ref 0.3–1.2)
Total Protein: 5.6 g/dL — ABNORMAL LOW (ref 6.0–8.3)

## 2012-05-11 LAB — CBC
HCT: 39.3 % (ref 39.0–52.0)
Hemoglobin: 14.2 g/dL (ref 13.0–17.0)
MCH: 36.4 pg — ABNORMAL HIGH (ref 26.0–34.0)
MCHC: 36.1 g/dL — ABNORMAL HIGH (ref 30.0–36.0)
MCV: 100.8 fL — ABNORMAL HIGH (ref 78.0–100.0)
Platelets: 122 10*3/uL — ABNORMAL LOW (ref 150–400)
RBC: 3.9 MIL/uL — ABNORMAL LOW (ref 4.22–5.81)
RDW: 12.2 % (ref 11.5–15.5)
WBC: 11.4 10*3/uL — ABNORMAL HIGH (ref 4.0–10.5)

## 2012-05-11 LAB — GLUCOSE, CAPILLARY
Glucose-Capillary: 97 mg/dL (ref 70–99)
Glucose-Capillary: 136 mg/dL — ABNORMAL HIGH (ref 70–99)
Glucose-Capillary: 113 mg/dL — ABNORMAL HIGH (ref 70–99)

## 2012-05-11 LAB — HEMOGLOBIN A1C
Hgb A1c MFr Bld: 5.2 %
Mean Plasma Glucose: 103 mg/dL

## 2012-05-11 LAB — LIPASE, BLOOD: Lipase: 28 U/L (ref 11–59)

## 2012-05-11 MED ORDER — PANTOPRAZOLE SODIUM 40 MG PO TBEC
40.0000 mg | DELAYED_RELEASE_TABLET | Freq: Every day | ORAL | Status: DC
  Administered 2012-05-11 – 2012-05-12 (×2): 40 mg via ORAL
  Filled 2012-05-11 (×2): qty 1

## 2012-05-11 NOTE — Progress Notes (Signed)
Patient ID: Eric Mueller  male  WUJ:[id number]    DOB: 1986/01/04    DOA: 05/09/2012  PCP: No primary provider on file.  Assessment/Plan: Principal Problem:  Acute pancreatitis Likely secondary to alcohol abuse. Patient was managed conservatively by [employer] rest, IV fluids and pain medications. His lipase has normalized and transaminases have improved. CT abdomen shows acute pancreatitis without necrosis. Patient has low-grade fevers which may be secondary to the pancreatic inflammation itself. Blood cultures x2 are negative to date. Patient denies nausea or vomiting. Has been tolerating water and ice chips. Will start clear liquids and advance diet as tolerated. Discontinue imipenem.   Active Problems: Seizure disorder  Continue Keppra  Alcohol abuse with Alcohol withdrawal Patient demonstrated some withdrawal features on 1/11. Currently these seem to have resolved. He has been placed on Ativan protocol and multivitamins. Abstinence from alcohol advised especially given fatty liver on CT abdomen and current acute pancreatitis. He verbalizes understanding. Patient is not forthcoming with exact amount of daily alcohol intake.  Mild thrombocytopenia Likely secondary to alcohol abuse. Follow CBC in a.m.  Hepatic steatosis Secondary to alcohol abuse. Abstinence counseled.  Reflux on CT PPI.  Ileus on CT Trial of liquid diet and monitor.  DVT Prophylaxis: SCDs Code Status: Full code Family communication: Discussed with patient's father at bedside (patient consented) Disposition: Discharge home in stable,? 1/12   Subjective: Patient feels better. Denies anxiety or shakiness. No abdominal pain. Tolerating ice chips and water. Wishes to start eating  Objective: Weight change:   Intake/Output Summary (Last 24 hours) at 05/11/12 1339 Last data filed at 05/11/12 0608  Gross per 24 hour  Intake 2563.17 ml  Output    400 ml  Net 2163.17 ml   Blood pressure 118/82, pulse 85,  temperature 98.9 F (37.2 C), temperature source Oral, resp. rate 15, height 5\' 10"  (1.778 m), weight 69.3 kg (152 lb 12.5 oz), SpO2 97.00%.  Physical Exam: General: Alert and awake, oriented x3, not in any acute distress. CVS: S1-S2 clear, no murmur rubs or gallops. Chest: clear to auscultation bilaterally, no wheezing, rales or rhonchi Abdomen: Nondistended, soft and nontender. Normal bowel sounds heard. Extremities: Symmetric 5 x 5 power. No tremulousness. Neuro: Alert and oriented. No focal neurological deficits.  Lab Results: Basic Metabolic Panel:  Lab 05/11/12 9562 05/10/12 0424  NA 134* 135  K 3.8 3.5  CL 98 99  CO2 27 27  GLUCOSE 104* 114*  BUN 7 4*  CREATININE 0.69 0.59  CALCIUM 8.5 8.1*  MG -- --  PHOS -- --   Liver Function Tests:  Lab 05/11/12 0408 05/10/12 0424  AST 71* 91*  ALT 59* 78*  ALKPHOS 68 72  BILITOT 5.0* 4.2*  PROT 5.6* 5.5*  ALBUMIN 2.8* 3.0*    Lab 05/11/12 0408 05/10/12 0424  LIPASE 28 151*  AMYLASE -- --   No results found for this basename: AMMONIA:2 in the last 168 hours CBC:  Lab 05/11/12 0408 05/10/12 0424  WBC 11.4* 17.8*  NEUTROABS -- --  HGB 14.2 15.1  HCT 39.3 41.9  MCV 100.8* 101.0*  PLT 122* 126*   Cardiac Enzymes: No results found for this basename: CKTOTAL:3,CKMB:3,CKMBINDEX:3,TROPONINI:3 in the last 168 hours BNP: No components found with this basename: POCBNP:2 CBG:  Lab 05/11/12 0733 05/10/12 2133 05/10/12 1616 05/10/12 0756 05/09/12 2137  GLUCAP 97 105* 119* 123* 135*     Micro Results: Recent Results (from the past 240 hour(s))  CULTURE, BLOOD (ROUTINE X 2)  Status: Normal (Preliminary result)   Collection Time   05/10/12  8:00 AM      Component Value Range Status Comment   Specimen Description BLOOD LEFT ARM   Final    Special Requests BOTTLES DRAWN AEROBIC AND ANAEROBIC [facility] EACH   Final    Culture  Setup Time 05/10/2012 13:21   Final    Culture     Final    Value:        BLOOD CULTURE RECEIVED  NO GROWTH TO DATE CULTURE WILL BE HELD FOR 5 DAYS BEFORE ISSUING A FINAL NEGATIVE REPORT   Report Status PENDING   Incomplete   CULTURE, BLOOD (ROUTINE X 2)     Status: Normal (Preliminary result)   Collection Time   05/10/12  8:13 AM      Component Value Range Status Comment   Specimen Description BLOOD LEFT HAND   Final    Special Requests BOTTLES DRAWN AEROBIC AND ANAEROBIC 5CC EACH   Final    Culture  Setup Time 05/10/2012 13:21   Final    Culture     Final    Value:        BLOOD CULTURE RECEIVED NO GROWTH TO DATE CULTURE WILL BE HELD FOR 5 DAYS BEFORE ISSUING A FINAL NEGATIVE REPORT   Report Status PENDING   Incomplete     Studies/Results: Ct Abdomen Pelvis W Contrast  05/09/2012  *RADIOLOGY REPORT*  Clinical Data: 1-day history of mid abdominal pain and nausea and vomiting.  Elevated serum lipase.  CT ABDOMEN AND PELVIS WITH CONTRAST  Technique:  Multidetector CT imaging of the abdomen and pelvis was performed following the standard protocol during bolus administration of intravenous contrast.  Contrast: OMNIPAQUE IOHEXOL 300 MG/ML. Oral contrast was also administered.  Comparison: None.  Findings: Edema/inflammation surrounding the body and tail of the pancreas.  Free fluid tracks inferiorly in the left anterior pararenal space and in the left paracolic gutter, and there is a small amount of free fluid dependently in the pelvis.  Pancreas enhances throughout, without evidence of pancreatic necrosis.  No evidence of pseudocyst formation.  Severe diffuse hepatic steatosis without focal hepatic parenchymal abnormality.  Normal-appearing spleen, adrenal glands, kidneys, and gallbladder.  No biliary ductal dilation.  No visible aorto- iliofemoral atherosclerosis.  No significant lymphadenopathy.  Very small hiatal hernia with oral contrast material in the distal esophagus.  Stomach otherwise decompressed and unremarkable. Borderline distention of several loops of jejunum in the left upper  quadrant consistent with a reflex ileus; remainder of the small bowel normal in appearance.  Normal-appearing colon.  Normal- appearing decompressed appendix in the right mid upper pelvis.  Urinary bladder decompressed and unremarkable.  Prostate gland and seminal vesicles normal for age.  Phleboliths in the left side of the pelvis.  Bone window images unremarkable.  Visualized lung bases clear.  IMPRESSION:  1.  Acute pancreatitis with complicating features. 2.  Small amount of free fluid in the left anterior pararenal space, left paracolic gutter, and dependently in the pelvis. 3.  Severe diffuse hepatic steatosis. 4.  Small hiatal hernia.  Oral contrast material in the esophagus is consistent with acute reflux and/or dysmotility. 5.  Mildly distended loops of jejunum in the left upper quadrant consistent with a reflex ileus.  No evidence of bowel obstruction.   Original Report Authenticated By: Hulan Saas, M.D.    Dg Chest Port 1 View  05/10/2012  *RADIOLOGY REPORT*  Clinical Data: Chest pain with cough.  Cigarette smoker.Fever.  PORTABLE CHEST - 1 VIEW  Comparison: None.  Findings:  Cardiopericardial silhouette within normal limits. Mediastinal contours normal. Trachea midline.  No airspace disease or effusion.  IMPRESSION: No active cardiopulmonary disease.   Original Report Authenticated By: Andreas Newport, M.D.     Medications: Scheduled Meds:    . folic acid  1 mg Oral Daily  . imipenem-cilastatin  500 mg Intravenous Q8H  . levETIRAcetam  500 mg Oral Q12H  . LORazepam  1 mg Intravenous Q8H  . multivitamin with minerals  1 tablet Oral Daily  . thiamine  100 mg Oral Daily   Or  . thiamine  100 mg Intravenous Daily      LOS: 2 days   [facility] M.D. [facility] 05/11/2012, 1:39 PM Pager: [phone]  If 7PM-7AM, please contact night-coverage [email or web address] Password [email or web address]

## 2012-05-12 LAB — COMPREHENSIVE METABOLIC PANEL
AST: 63 U/L — ABNORMAL HIGH (ref 0–37)
Albumin: 2.6 g/dL — ABNORMAL LOW (ref 3.5–5.2)
BUN: 7 mg/dL (ref 6–23)
CO2: 24 mEq/L (ref 19–32)
Calcium: 8 mg/dL — ABNORMAL LOW (ref 8.4–10.5)
Chloride: 99 mEq/L (ref 96–112)
Creatinine, Ser: 0.66 mg/dL (ref 0.50–1.35)
GFR calc non Af Amer: >90 mL/min (ref >90)
Total Bilirubin: 3.7 mg/dL — ABNORMAL HIGH (ref 0.3–1.2)

## 2012-05-12 LAB — CBC
Hemoglobin: 13.1 g/dL (ref 13.0–17.0)
Platelets: 157 10*3/uL (ref 150–400)
RBC: 3.57 MIL/uL — ABNORMAL LOW (ref 4.22–5.81)
WBC: 9.6 10*3/uL (ref 4.0–10.5)

## 2012-05-12 MED ORDER — FOLIC ACID 1 MG PO TABS: 1.0000 mg | ORAL_TABLET | Freq: Every day | ORAL | Status: DC

## 2012-05-12 MED ORDER — ADULT MULTIVITAMIN W/MINERALS CH: 1.0000 | ORAL_TABLET | Freq: Every day | ORAL | Status: DC

## 2012-05-12 MED ORDER — THIAMINE HCL 100 MG PO TABS: 100.0000 mg | ORAL_TABLET | Freq: Every day | ORAL | Status: DC

## 2012-05-12 NOTE — Discharge Summary (Signed)
Physician Discharge Summary  Eric Mueller ZOX:[id number] DOB: 23-Jul-1985 DOA: 05/09/2012  PCP: Lucilla Edin, MD  Admit date: 05/09/2012 Discharge date: 05/12/2012  Time spent: Less than 30 minutes   Recommendations for Outpatient Follow-up:  1. With Dr. Earl Lites, PCP in 1 week from hospital discharge.  Discharge Diagnoses:  Principal Problem:  *Acute pancreatitis Active Problems:  Seizure disorder  Alcohol abuse  Alcohol withdrawal  Thrombocytopenia   Discharge Condition: Improved and stable.  Diet recommendation: Low fat diet  Filed Weights   05/09/12 1344  Weight: 69.3 kg (152 lb 12.5 oz)    History of present illness:  27 year old male with history of alcohol dependence and seizure disorder was admitted on 1/10 with complaints of abdominal pain. In the ED, lipase was 799 and CT abdomen revealed acute pancreatitis without necrosis. Patient was admitted for further evaluation and management.  Hospital Course:   Acute pancreatitis  Likely secondary to alcohol abuse. Patient was managed conservatively by bowel rest, IV fluids and pain medications. His lipase normalized and transaminases have improved. CT abdomen shows acute pancreatitis without necrosis. Patient had low-grade fevers which may be secondary to the pancreatic inflammation itself. Blood cultures x2 are negative to date. Imipenem was briefly started but discontinued. His diet was gradually advanced and patient has tolerated a regular consistency diet without nausea, vomiting. He has mild abdominal soreness but significantly improved than on admission. His had soft BMs. He has been repeatedly counseled regarding abstinence from alcohol and he verbalizes understanding. His PCP may consider outpatient GI consultation if deemed necessary  Seizure disorder  Continue Keppra. No seizures in hospital.   Alcohol abuse with Alcohol withdrawal  Patient demonstrated some withdrawal features on 1/11. He was placed on Ativan  protocol and these resolved. Abstinence from alcohol advised especially given fatty liver on CT abdomen and current acute pancreatitis. He verbalizes understanding. Patient is not forthcoming with exact amount of daily alcohol intake.   Mild thrombocytopenia  Likely secondary to alcohol abuse. Resolved  Hepatic steatosis  Secondary to alcohol abuse. Abstinence counseled.   Reflux on CT  PPI.   Ileus on CT  Patient tolerated diet. Had soft BMs.   Procedures:  None  Consultations:  None  Discharge Exam:  Complaints Abdomen feels intermittently sore but no pain. No nausea or vomiting. Tolerating regular consistency diet. Had a couple of soft BMs.  Filed Vitals:   05/11/12 1411 05/11/12 2116 05/12/12 0539 05/12/12 1421  BP: 135/84 110/82 128/86 132/89  Pulse: 82 82 76 80  Temp: 98.9 F (37.2 C) 99.3 F (37.4 C) 98.5 F (36.9 C) 98.9 F (37.2 C)  TempSrc: Oral Oral Oral Oral  Resp: 16 16 18 20   Height:      Weight:      SpO2: 98% 98% 98% 100%    General: Alert and awake, oriented x3, not in any acute distress. Not anxious or tremulous. CVS: S1-S2 clear, no murmur rubs or gallops.  Chest: clear to auscultation bilaterally, no wheezing, rales or rhonchi  Abdomen: Nondistended, soft and nontender. Normal bowel sounds heard.  Extremities: Symmetric 5 x 5 power. No tremulousness.  Neuro: Alert and oriented. No focal neurological deficits.   Discharge Instructions      Discharge Orders    Future Orders Please Complete By Expires   Discharge instructions      Comments:   Diet: Low fat diet.   Activity as tolerated - No restrictions      Call MD for:  temperature >  100.4      Call MD for:  persistant nausea and vomiting      Call MD for:  severe uncontrolled pain          Medication List     As of 05/12/2012  2:30 PM    TAKE these medications         folic acid 1 MG tablet   Commonly known as: FOLVITE   Take 1 tablet (1 mg total) by mouth daily.       levETIRAcetam 500 MG tablet   Commonly known as: KEPPRA   Take 500 mg by mouth every 12 (twelve) hours.      multivitamin with minerals Tabs   Take 1 tablet by mouth daily.      thiamine 100 MG tablet   Take 1 tablet (100 mg total) by mouth daily.        Follow-up Information    Follow up with DAUB, STEVE A, MD. Schedule an appointment as soon as possible for a visit in 1 week.   Contact information:   [address] [place] [address] [phone]           The results of significant diagnostics from this hospitalization (including imaging, microbiology, ancillary and laboratory) are listed below for reference.    Significant Diagnostic Studies: Ct Abdomen Pelvis W Contrast  05/09/2012  *RADIOLOGY REPORT*  Clinical Data: 1-day history of mid abdominal pain and nausea and vomiting.  Elevated serum lipase.  CT ABDOMEN AND PELVIS WITH CONTRAST  Technique:  Multidetector CT imaging of the abdomen and pelvis was performed following the standard protocol during bolus administration of intravenous contrast.  Contrast: OMNIPAQUE IOHEXOL 300 MG/ML. Oral contrast was also administered.  Comparison: None.  Findings: Edema/inflammation surrounding the body and tail of the pancreas.  Free fluid tracks inferiorly in the left anterior pararenal space and in the left paracolic gutter, and there is a small amount of free fluid dependently in the pelvis.  Pancreas enhances throughout, without evidence of pancreatic necrosis.  No evidence of pseudocyst formation.  Severe diffuse hepatic steatosis without focal hepatic parenchymal abnormality.  Normal-appearing spleen, adrenal glands, kidneys, and gallbladder.  No biliary ductal dilation.  No visible aorto- iliofemoral atherosclerosis.  No significant lymphadenopathy.  Very small hiatal hernia with oral contrast material in the distal esophagus.  Stomach otherwise decompressed and unremarkable. Borderline distention of several loops of jejunum in  the left upper quadrant consistent with a reflex ileus; remainder of the small bowel normal in appearance.  Normal-appearing colon.  Normal- appearing decompressed appendix in the right mid upper pelvis.  Urinary bladder decompressed and unremarkable.  Prostate gland and seminal vesicles normal for age.  Phleboliths in the left side of the pelvis.  Bone window images unremarkable.  Visualized lung bases clear.  IMPRESSION:  1.  Acute pancreatitis with complicating features. 2.  Small amount of free fluid in the left anterior pararenal space, left paracolic gutter, and dependently in the pelvis. 3.  Severe diffuse hepatic steatosis. 4.  Small hiatal hernia.  Oral contrast material in the esophagus is consistent with acute reflux and/or dysmotility. 5.  Mildly distended loops of jejunum in the left upper quadrant consistent with a reflex ileus.  No evidence of bowel obstruction.   Original Report Authenticated By: Hulan Saas, M.D.    Dg Chest Port 1 View  05/10/2012  *RADIOLOGY REPORT*  Clinical Data: Chest pain with cough.  Cigarette smoker.Fever.  PORTABLE CHEST - 1 VIEW  Comparison:  None.  Findings:  Cardiopericardial silhouette within normal limits. Mediastinal contours normal. Trachea midline.  No airspace disease or effusion.  IMPRESSION: No active cardiopulmonary disease.   Original Report Authenticated By: Andreas Newport, M.D.     Microbiology: Recent Results (from the past 240 hour(s))  CULTURE, BLOOD (ROUTINE X 2)     Status: Normal (Preliminary result)   Collection Time   05/10/12  8:00 AM      Component Value Range Status Comment   Specimen Description BLOOD LEFT ARM   Final    Special Requests BOTTLES DRAWN AEROBIC AND ANAEROBIC [facility] EACH   Final    Culture  Setup Time 05/10/2012 13:21   Final    Culture     Final    Value:        BLOOD CULTURE RECEIVED NO GROWTH TO DATE CULTURE WILL BE HELD FOR 5 DAYS BEFORE ISSUING A FINAL NEGATIVE REPORT   Report Status PENDING   Incomplete     CULTURE, BLOOD (ROUTINE X 2)     Status: Normal (Preliminary result)   Collection Time   05/10/12  8:13 AM      Component Value Range Status Comment   Specimen Description BLOOD LEFT HAND   Final    Special Requests BOTTLES DRAWN AEROBIC AND ANAEROBIC 5CC EACH   Final    Culture  Setup Time 05/10/2012 13:21   Final    Culture     Final    Value:        BLOOD CULTURE RECEIVED NO GROWTH TO DATE CULTURE WILL BE HELD FOR 5 DAYS BEFORE ISSUING A FINAL NEGATIVE REPORT   Report Status PENDING   Incomplete      Labs: Basic Metabolic Panel:  Lab 05/12/12 1610 05/11/12 0408 05/10/12 0424 05/09/12 0610  NA 133* 134* 135 139  K 3.8 3.8 3.5 3.2*  CL 99 98 99 93*  CO2 24 27 27 25   GLUCOSE 106* 104* 114* 161*  BUN 7 7 4* 6  CREATININE 0.66 0.69 0.59 0.66  CALCIUM 8.0* 8.5 8.1* 8.9  MG -- -- -- --  PHOS -- -- -- --   Liver Function Tests:  Lab 05/12/12 0422 05/11/12 0408 05/10/12 0424 05/09/12 0610  AST 63* 71* 91* 215*  ALT 47 59* 78* 140*  ALKPHOS 62 68 72 106  BILITOT 3.7* 5.0* 4.2* 2.4*  PROT 5.5* 5.6* 5.5* 7.2  ALBUMIN 2.6* 2.8* 3.0* 3.9    Lab 05/11/12 0408 05/10/12 0424 05/09/12 0643  LIPASE 28 151* 799*  AMYLASE -- -- --   No results found for this basename: AMMONIA:5 in the last 168 hours CBC:  Lab 05/12/12 0422 05/11/12 0408 05/10/12 0424 05/09/12 0610  WBC 9.6 11.4* 17.8* 15.9*  NEUTROABS -- -- -- --  HGB 13.1 14.2 15.1 18.3*  HCT 35.4* 39.3 41.9 48.0  MCV 99.2 100.8* 101.0* 98.2  PLT 157 122* 126* 206   Cardiac Enzymes: No results found for this basename: CKTOTAL:5,CKMB:5,CKMBINDEX:5,TROPONINI:5 in the last 168 hours BNP: BNP (last 3 results) No results found for this basename: PROBNP:3 in the last 8760 hours CBG:  Lab 05/12/12 0737 05/11/12 2138 05/11/12 1651 05/11/12 0733 05/10/12 2133  GLUCAP 100* 113* 136* 97 105*       Signed:  Keelin Sheridan  [facility] 05/12/2012, 2:30 PM

## 2012-05-12 NOTE — Progress Notes (Signed)
Patient discharged home, all discharge medications and instructions reviewed and questions answered. Patient states will ambulate to vehicle, does not need a wheelchair.

## 2012-05-16 LAB — CULTURE, BLOOD (ROUTINE X 2)
Culture: NO GROWTH
Culture: NO GROWTH

## 2012-09-03 ENCOUNTER — Other Ambulatory Visit: Payer: Self-pay | Admitting: Emergency Medicine

## 2012-09-18 ENCOUNTER — Encounter (HOSPITAL_COMMUNITY): Payer: Self-pay | Admitting: Emergency Medicine

## 2012-09-18 ENCOUNTER — Ambulatory Visit (INDEPENDENT_AMBULATORY_CARE_PROVIDER_SITE_OTHER): Payer: BC Managed Care – PPO | Admitting: Emergency Medicine

## 2012-09-18 ENCOUNTER — Emergency Department (HOSPITAL_COMMUNITY)
Admission: EM | Admit: 2012-09-18 | Discharge: 2012-09-18 | Disposition: A | Discharge status: Home / Self Care | Payer: BC Managed Care – PPO | Attending: Emergency Medicine | Admitting: Emergency Medicine

## 2012-09-18 ENCOUNTER — Other Ambulatory Visit: Payer: Self-pay | Admitting: Emergency Medicine

## 2012-09-18 VITALS — BP 128/76 | HR 132 | Temp 98.2°F | Resp 16 | Ht 69.5 in | Wt 149.4 lb

## 2012-09-18 DIAGNOSIS — W503XXA Accidental bite by another person, initial encounter: Secondary | ICD-10-CM | POA: Insufficient documentation

## 2012-09-18 DIAGNOSIS — Z9114 Patient's other noncompliance with medication regimen: Secondary | ICD-10-CM

## 2012-09-18 DIAGNOSIS — Z79899 Other long term (current) drug therapy: Secondary | ICD-10-CM | POA: Insufficient documentation

## 2012-09-18 DIAGNOSIS — R569 Unspecified convulsions: Secondary | ICD-10-CM

## 2012-09-18 DIAGNOSIS — G40909 Epilepsy, unspecified, not intractable, without status epilepticus: Secondary | ICD-10-CM | POA: Insufficient documentation

## 2012-09-18 DIAGNOSIS — Y9289 Other specified places as the place of occurrence of the external cause: Secondary | ICD-10-CM | POA: Insufficient documentation

## 2012-09-18 DIAGNOSIS — F1010 Alcohol abuse, uncomplicated: Secondary | ICD-10-CM

## 2012-09-18 DIAGNOSIS — Y9389 Activity, other specified: Secondary | ICD-10-CM | POA: Insufficient documentation

## 2012-09-18 DIAGNOSIS — R160 Hepatomegaly, not elsewhere classified: Secondary | ICD-10-CM

## 2012-09-18 DIAGNOSIS — Z8659 Personal history of other mental and behavioral disorders: Secondary | ICD-10-CM | POA: Insufficient documentation

## 2012-09-18 DIAGNOSIS — S01502A Unspecified open wound of oral cavity, initial encounter: Secondary | ICD-10-CM | POA: Insufficient documentation

## 2012-09-18 LAB — POCT CBC
MCH, POC: 35.8 pg — AB (ref 27–31.2)
MCV: 108.6 fL — AB (ref 80–97)
MID (cbc): 1.2 — AB (ref 0–0.9)
Platelet Count, POC: 258 10*3/uL (ref 142–424)
RBC: 4.25 M/uL — AB (ref 4.69–6.13)
WBC: 15.1 10*3/uL — AB (ref 4.6–10.2)

## 2012-09-18 LAB — URINE MICROSCOPIC-ADD ON

## 2012-09-18 LAB — POCT I-STAT, CHEM 8
BUN: 4 mg/dL — ABNORMAL LOW (ref 6–23)
Calcium, Ion: 1.12 mmol/L (ref 1.12–1.23)
Chloride: 100 mEq/L (ref 96–112)
Glucose, Bld: 173 mg/dL — ABNORMAL HIGH (ref 70–99)
Potassium: 4.1 mEq/L (ref 3.5–5.1)

## 2012-09-18 LAB — URINALYSIS, ROUTINE W REFLEX MICROSCOPIC
Nitrite: NEGATIVE
Specific Gravity, Urine: 1.027 (ref 1.005–1.030)
Urobilinogen, UA: 0.2 mg/dL (ref 0.0–1.0)

## 2012-09-18 LAB — GLUCOSE, POCT (MANUAL RESULT ENTRY): POC Glucose: 137 mg/dl — AB (ref 70–99)

## 2012-09-18 LAB — RAPID URINE DRUG SCREEN, HOSP PERFORMED
Cocaine: NOT DETECTED
Opiates: NOT DETECTED

## 2012-09-18 MED ORDER — ONDANSETRON HCL 4 MG/2ML IJ SOLN
INTRAMUSCULAR | Status: AC
  Administered 2012-09-18: 4 mg
  Filled 2012-09-18: qty 2

## 2012-09-18 MED ORDER — SODIUM CHLORIDE 0.9 % IV SOLN
1000.0000 mg | Freq: Once | INTRAVENOUS | Status: AC
  Administered 2012-09-18: 1000 mg via INTRAVENOUS
  Filled 2012-09-18: qty 10

## 2012-09-18 MED ORDER — LORAZEPAM 2 MG/ML IJ SOLN
1.0000 mg | Freq: Once | INTRAMUSCULAR | Status: AC
  Administered 2012-09-18: 1 mg via INTRAVENOUS

## 2012-09-18 MED ORDER — LORAZEPAM 2 MG/ML IJ SOLN
INTRAMUSCULAR | Status: AC
  Filled 2012-09-18: qty 1

## 2012-09-18 MED ORDER — LEVETIRACETAM 750 MG PO TABS: 750.0000 mg | ORAL_TABLET | Freq: Two times a day (BID) | ORAL | Status: DC

## 2012-09-18 MED ORDER — SODIUM CHLORIDE 0.9 % IV BOLUS (SEPSIS)
1000.0000 mL | Freq: Once | INTRAVENOUS | Status: AC
  Administered 2012-09-18: 1000 mL via INTRAVENOUS

## 2012-09-18 MED ORDER — LORAZEPAM 2 MG/ML IJ SOLN: 2.0000 mg | Freq: Once | INTRAMUSCULAR | Status: DC

## 2012-09-18 NOTE — ED Provider Notes (Signed)
History     CSN: [id number]  Arrival date & time 09/18/12  1431   First MD Initiated Contact with Patient 09/18/12 1453      Chief Complaint  Patient presents with  . Seizures    (Consider location/radiation/quality/duration/timing/severity/associated sxs/prior treatment) HPI Comments: Pt comes in with cc of seizure. LEVEL 5 CAVEAT FOR POST ICTAL CONFUSION Pt has hx of seizures, per our report, he is on keppra 500 mg bid for it. Mother states that patient stopped taking his keppra 2 weeks ago - as he felt it wasn't helping. Pt also has hx of alcohol abuse, but he has likely slowed down per mother. Pt had a seizure around noon today, generalized tonic clonic, and then 1 more episode while in the triage. No recent infections.  The history is provided by the patient.    Past Medical History  Diagnosis Date  . Seizure disorder   . Seizures   . Allergy   . Anxiety     Past Surgical History  Procedure Laterality Date  . Wisdom tooth extraction      Family History  Problem Relation Age of Onset  . Heart disease      cardiovascular disease  . Cancer      History  Substance Use Topics  . Smoking status: Never Smoker   . Smokeless tobacco: Never Used  . Alcohol Use: Yes      Review of Systems  Unable to perform ROS: Mental status change  Constitutional: Positive for activity change.  Neurological: Positive for seizures.    Allergies  Oxycodone  Home Medications   Current Outpatient Rx  Name  Route  Sig  Dispense  Refill  . folic acid (FOLVITE) 1 MG tablet   Oral   Take 1 tablet (1 mg total) by mouth daily.   30 tablet   0   . levETIRAcetam (KEPPRA) 500 MG tablet   Oral   Take 500 mg by mouth every 12 (twelve) hours.          . Multiple Vitamin (MULTIVITAMIN WITH MINERALS) TABS   Oral   Take 1 tablet by mouth daily.         Marland Kitchen thiamine 100 MG tablet   Oral   Take 1 tablet (100 mg total) by mouth daily.   30 tablet   0     BP 139/82   Pulse 133  Temp(Src) 98.6 F (37 C) (Oral)  Resp 17  SpO2 95%  Physical Exam  Nursing note and vitals reviewed. Constitutional: He appears well-developed.  HENT:  Head: Normocephalic and atraumatic.  Bilateral tongue biting  Eyes: Conjunctivae and EOM are normal. Pupils are equal, round, and reactive to light.  Neck: Normal range of motion. Neck supple.  Cardiovascular: Normal rate and regular rhythm.   Pulmonary/Chest: Effort normal and breath sounds normal.  Abdominal: Soft. Bowel sounds are normal. He exhibits no distension. There is no tenderness. There is no rebound and no guarding.  Neurological: He is alert.  Confused -post ictal  Skin: Skin is warm.    ED Course  Procedures (including critical care time)  Labs Reviewed  POCT I-STAT, CHEM 8 - Abnormal; Notable for the following:    BUN 4 (*)    Glucose, Bld 173 (*)    All other components within normal limits  URINALYSIS, ROUTINE W REFLEX MICROSCOPIC  URINE RAPID DRUG SCREEN (HOSP PERFORMED)   No results found.   No diagnosis found.    MDM  DDx: -Seizure  disorder -Meningitis -Trauma -ICH -Electrolyte abnormality -Metabolic derangement -Stroke -Toxin induced seizures -Medication side effects -Hypoxia -Hypoglycemia   Pt comes in with cc of seizure. Had a seizure in the triage, post ictal now. Pt is non compliant with his keppra. Will load. Mother is sure that patient was increased to 750 mg bid - so we will prescribe that dose to the patient upon discharge. Basic screening labs ordered.  Derwood Kaplan, MD 09/18/12 1553

## 2012-09-18 NOTE — Progress Notes (Signed)
  Subjective:    Patient ID: Eric Mueller, male    DOB: 09/24/1985, 27 y.o.   MRN: [id number]  HPI patient was in his usual state of health until about 11:30 when he had a two-minute episode of tonic-clonic stiffening activity of the arms and legs. This was followed by an arrow or long episode of confusion. He has a history of alcohol abuse he has a history of not taking his seizure meds in the last 2 weeks. He's been previously hospitalized with pancreatitis    Review of Systems     Objective:   Physical Exam physical exam reveals a tremulous male who is not in any distress. He is alert cooperative and oriented. Chest was clear heart regular rate no murmurs neurological exam outside of bilateral tremor of the upper extremity . Liver was down approximately 3 cm .Marland Kitchen  Results for orders placed in visit on 09/18/12  GLUCOSE, POCT (MANUAL RESULT ENTRY)      Result Value Range   POC Glucose 137 (*) 70 - 99 mg/dl  POCT CBC      Result Value Range   WBC 15.1 (*) 4.6 - 10.2 K/uL   Lymph, poc 1.3  0.6 - 3.4   POC LYMPH PERCENT 8.4 (*) 10 - 50 %L   MID (cbc) 1.2 (*) 0 - 0.9   POC MID % 7.9  0 - 12 %M   POC Granulocyte 12.6 (*) 2 - 6.9   Granulocyte percent 83.7 (*) 37 - 80 %G   RBC 4.25 (*) 4.69 - 6.13 M/uL   Hemoglobin 15.2  14.1 - 18.1 g/dL   HCT, POC 40.9  81.1 - 53.7 %   MCV 108.6 (*) 80 - 97 fL   MCH, POC 35.8 (*) 27 - 31.2 pg   MCHC 32.9  31.8 - 35.4 g/dL   RDW, POC 91.4     Platelet Count, POC 258  142 - 424 K/uL   MPV 8.0  0 - 99.8 fL        Assessment & Plan:  Patient advised that he will need rehabilitation once he is over this acute seizure disorder problem . I called EMS and they arrived and he refused transport. I talked with his father and advised him that I felt he needed rehabilitation for alcohol misuse. I walked him to the car and his mother was taking him to the emergency room . The mother was apparently talking to her ex-husband and they've made a decision that he  would not go by EMS to the ER for evaluation. Patient given 0.25 Xanax by mouth prior to his leaving. He has a history of pancreatitis and on exam today thought it may have any hepatitis probably secondary to EtOH.

## 2012-09-18 NOTE — ED Notes (Signed)
While I was triaging this pt the pt had a clonic seizure lasting approximately  2 minutes. Pt is arousable at this time, mother and ED team at bedside.

## 2012-09-19 LAB — COMPREHENSIVE METABOLIC PANEL
AST: 292 U/L — ABNORMAL HIGH (ref 0–37)
BUN: 5 mg/dL — ABNORMAL LOW (ref 6–23)
CO2: 21 mEq/L (ref 19–32)
Calcium: 9.7 mg/dL (ref 8.4–10.5)
Chloride: 95 mEq/L — ABNORMAL LOW (ref 96–112)
Creat: 0.82 mg/dL (ref 0.50–1.35)
Glucose, Bld: 143 mg/dL — ABNORMAL HIGH (ref 70–99)

## 2012-09-19 LAB — TSH: TSH: 1.191 u[IU]/mL (ref 0.350–4.500)

## 2012-09-23 LAB — HEPATITIS PANEL, ACUTE
HCV Ab: NEGATIVE
Hep B C IgM: NEGATIVE
Hepatitis B Surface Ag: NEGATIVE

## 2012-09-26 ENCOUNTER — Telehealth: Payer: Self-pay

## 2012-09-26 NOTE — Telephone Encounter (Signed)
Pts mother is calling back stating she was told to call back and speak to someone  Call back number is [phone]

## 2012-09-26 NOTE — Telephone Encounter (Signed)
Called her. He is doing well and he does have appt with Neurologist. Advised again, patient needs inpatient rehab for ETOH rehab. She will discuss with him, they have not discussed.

## 2012-10-02 ENCOUNTER — Ambulatory Visit: Payer: BC Managed Care – PPO | Admitting: Neurology

## 2012-11-26 ENCOUNTER — Encounter (HOSPITAL_COMMUNITY): Payer: Self-pay | Admitting: *Deleted

## 2012-11-26 ENCOUNTER — Inpatient Hospital Stay (HOSPITAL_COMMUNITY)
Admission: EM | Admit: 2012-11-26 | Discharge: 2012-11-28 | DRG: 024 | Disposition: A | Discharge status: Home / Self Care | Payer: BC Managed Care – PPO | Attending: Internal Medicine | Admitting: Internal Medicine

## 2012-11-26 DIAGNOSIS — X58XXXA Exposure to other specified factors, initial encounter: Secondary | ICD-10-CM

## 2012-11-26 DIAGNOSIS — R7401 Elevation of levels of liver transaminase levels: Secondary | ICD-10-CM | POA: Diagnosis present

## 2012-11-26 DIAGNOSIS — G40909 Epilepsy, unspecified, not intractable, without status epilepticus: Principal | ICD-10-CM | POA: Diagnosis present

## 2012-11-26 DIAGNOSIS — K859 Acute pancreatitis without necrosis or infection, unspecified: Secondary | ICD-10-CM

## 2012-11-26 DIAGNOSIS — K769 Liver disease, unspecified: Secondary | ICD-10-CM | POA: Diagnosis present

## 2012-11-26 DIAGNOSIS — K140 Glossitis: Secondary | ICD-10-CM | POA: Diagnosis present

## 2012-11-26 DIAGNOSIS — R112 Nausea with vomiting, unspecified: Secondary | ICD-10-CM | POA: Diagnosis present

## 2012-11-26 DIAGNOSIS — K148 Other diseases of tongue: Secondary | ICD-10-CM | POA: Diagnosis present

## 2012-11-26 DIAGNOSIS — F1010 Alcohol abuse, uncomplicated: Secondary | ICD-10-CM | POA: Diagnosis present

## 2012-11-26 DIAGNOSIS — D72829 Elevated white blood cell count, unspecified: Secondary | ICD-10-CM | POA: Diagnosis present

## 2012-11-26 DIAGNOSIS — R7402 Elevation of levels of lactic acid dehydrogenase (LDH): Secondary | ICD-10-CM | POA: Diagnosis present

## 2012-11-26 DIAGNOSIS — D696 Thrombocytopenia, unspecified: Secondary | ICD-10-CM

## 2012-11-26 DIAGNOSIS — E876 Hypokalemia: Secondary | ICD-10-CM | POA: Diagnosis present

## 2012-11-26 LAB — POCT I-STAT, CHEM 8
BUN: 4 mg/dL — ABNORMAL LOW (ref 6–23)
Calcium, Ion: 1 mmol/L — ABNORMAL LOW (ref 1.12–1.23)
Chloride: 95 mEq/L — ABNORMAL LOW (ref 96–112)
Creatinine, Ser: 0.8 mg/dL (ref 0.50–1.35)
Glucose, Bld: 165 mg/dL — ABNORMAL HIGH (ref 70–99)
HCT: 48 % (ref 39.0–52.0)
Hemoglobin: 16.3 g/dL (ref 13.0–17.0)
Potassium: 2.7 mEq/L — CL (ref 3.5–5.1)
Sodium: 138 mEq/L (ref 135–145)
TCO2: 22 mmol/L (ref 0–100)

## 2012-11-26 LAB — GLUCOSE, CAPILLARY: Glucose-Capillary: 168 mg/dL — ABNORMAL HIGH (ref 70–99)

## 2012-11-26 MED ORDER — ONDANSETRON HCL 4 MG/2ML IJ SOLN: 4.0000 mg | Freq: Once | INTRAMUSCULAR | Status: AC

## 2012-11-26 MED ORDER — POTASSIUM CHLORIDE 10 MEQ/100ML IV SOLN
10.0000 meq | INTRAVENOUS | Status: AC
  Administered 2012-11-26: 10 meq via INTRAVENOUS
  Filled 2012-11-26 (×2): qty 100

## 2012-11-26 MED ORDER — ONDANSETRON HCL 4 MG/2ML IJ SOLN
INTRAMUSCULAR | Status: AC
  Administered 2012-11-26: 4 mg via INTRAVENOUS
  Filled 2012-11-26: qty 2

## 2012-11-26 NOTE — ED Notes (Signed)
History of seizures--started at age 27; being followed at [facility]; normally has seizures about one every six months; per mother pt had seizure around 6pm with second seizure at 9pm; per mother lasted 3-4 mins; pt alert on arrival; vomiting; shakey; pt took normal dose of Keppra 750 mg at 6pm

## 2012-11-26 NOTE — ED Notes (Signed)
Patient actively vomiting at this time. Dr. Juleen China notified.

## 2012-11-27 ENCOUNTER — Emergency Department (HOSPITAL_COMMUNITY): Payer: BC Managed Care – PPO

## 2012-11-27 ENCOUNTER — Encounter (HOSPITAL_COMMUNITY): Payer: Self-pay | Admitting: *Deleted

## 2012-11-27 DIAGNOSIS — R7401 Elevation of levels of liver transaminase levels: Secondary | ICD-10-CM | POA: Diagnosis present

## 2012-11-27 DIAGNOSIS — E876 Hypokalemia: Secondary | ICD-10-CM | POA: Diagnosis present

## 2012-11-27 DIAGNOSIS — K859 Acute pancreatitis without necrosis or infection, unspecified: Secondary | ICD-10-CM

## 2012-11-27 DIAGNOSIS — D696 Thrombocytopenia, unspecified: Secondary | ICD-10-CM

## 2012-11-27 DIAGNOSIS — D72829 Elevated white blood cell count, unspecified: Secondary | ICD-10-CM | POA: Diagnosis present

## 2012-11-27 LAB — COMPREHENSIVE METABOLIC PANEL
ALT: 161 U/L — ABNORMAL HIGH (ref 0–53)
ALT: 122 U/L — ABNORMAL HIGH (ref 0–53)
Alkaline Phosphatase: 106 U/L (ref 39–117)
BUN: 6 mg/dL (ref 6–23)
BUN: 6 mg/dL (ref 6–23)
CO2: 22 mEq/L (ref 19–32)
CO2: 29 mEq/L (ref 19–32)
Calcium: 9.4 mg/dL (ref 8.4–10.5)
Creatinine, Ser: 0.65 mg/dL (ref 0.50–1.35)
GFR calc Af Amer: >90 mL/min (ref >90)
GFR calc Af Amer: >90 mL/min (ref >90)
GFR calc non Af Amer: >90 mL/min (ref >90)
GFR calc non Af Amer: >90 mL/min (ref >90)
Glucose, Bld: 148 mg/dL — ABNORMAL HIGH (ref 70–99)
Glucose, Bld: 100 mg/dL — ABNORMAL HIGH (ref 70–99)
Potassium: 3 mEq/L — ABNORMAL LOW (ref 3.5–5.1)
Sodium: 139 mEq/L (ref 135–145)
Total Bilirubin: 4.1 mg/dL — ABNORMAL HIGH (ref 0.3–1.2)
Total Protein: 7.4 g/dL (ref 6.0–8.3)

## 2012-11-27 LAB — URINALYSIS, ROUTINE W REFLEX MICROSCOPIC
Nitrite: NEGATIVE
Specific Gravity, Urine: 1.025 (ref 1.005–1.030)
Urobilinogen, UA: 1 mg/dL (ref 0.0–1.0)

## 2012-11-27 LAB — CBC
HCT: 42.2 % (ref 39.0–52.0)
Hemoglobin: 15 g/dL (ref 13.0–17.0)
MCH: 35.9 pg — ABNORMAL HIGH (ref 26.0–34.0)
MCHC: 35.5 g/dL (ref 30.0–36.0)
MCHC: 35.8 g/dL (ref 30.0–36.0)
MCV: 101 fL — ABNORMAL HIGH (ref 78.0–100.0)
Platelets: 110 10*3/uL — ABNORMAL LOW (ref 150–400)
RBC: 4.18 MIL/uL — ABNORMAL LOW (ref 4.22–5.81)
RDW: 12.8 % (ref 11.5–15.5)

## 2012-11-27 LAB — URINE MICROSCOPIC-ADD ON

## 2012-11-27 LAB — ETHANOL: Alcohol, Ethyl (B): <11 mg/dL (ref 0–11)

## 2012-11-27 LAB — MAGNESIUM: Magnesium: 1.4 mg/dL — ABNORMAL LOW (ref 1.5–2.5)

## 2012-11-27 LAB — RAPID URINE DRUG SCREEN, HOSP PERFORMED
Barbiturates: NOT DETECTED
Cocaine: NOT DETECTED

## 2012-11-27 LAB — LIPASE, BLOOD: Lipase: 27 U/L (ref 11–59)

## 2012-11-27 MED ORDER — POTASSIUM CHLORIDE 10 MEQ/100ML IV SOLN
10.0000 meq | Freq: Once | INTRAVENOUS | Status: AC
  Administered 2012-11-27: 10 meq via INTRAVENOUS

## 2012-11-27 MED ORDER — SODIUM CHLORIDE 0.9 % IV SOLN
500.0000 mg | Freq: Once | INTRAVENOUS | Status: AC
  Administered 2012-11-27: 500 mg via INTRAVENOUS
  Filled 2012-11-27: qty 5

## 2012-11-27 MED ORDER — PROMETHAZINE HCL 25 MG/ML IJ SOLN: 12.5000 mg | Freq: Four times a day (QID) | INTRAMUSCULAR | Status: DC | PRN

## 2012-11-27 MED ORDER — POTASSIUM CHLORIDE 10 MEQ/100ML IV SOLN
10.0000 meq | INTRAVENOUS | Status: AC
  Administered 2012-11-27 (×4): 10 meq via INTRAVENOUS
  Filled 2012-11-27: qty 400

## 2012-11-27 MED ORDER — ENOXAPARIN SODIUM 40 MG/0.4ML ~~LOC~~ SOLN
40.0000 mg | SUBCUTANEOUS | Status: DC
  Filled 2012-11-27: qty 0.4

## 2012-11-27 MED ORDER — SODIUM CHLORIDE 0.9 % IV SOLN
INTRAVENOUS | Status: DC
  Administered 2012-11-27: 02:00:00 via INTRAVENOUS

## 2012-11-27 MED ORDER — LORAZEPAM 2 MG/ML IJ SOLN
1.0000 mg | Freq: Once | INTRAMUSCULAR | Status: AC
  Administered 2012-11-27: 1 mg via INTRAVENOUS
  Filled 2012-11-27: qty 1

## 2012-11-27 MED ORDER — POTASSIUM CHLORIDE 10 MEQ/100ML IV SOLN: 10.0000 meq | INTRAVENOUS | Status: DC

## 2012-11-27 MED ORDER — ONDANSETRON HCL 4 MG PO TABS: 4.0000 mg | ORAL_TABLET | Freq: Four times a day (QID) | ORAL | Status: DC | PRN

## 2012-11-27 MED ORDER — SODIUM CHLORIDE 0.9 % IV SOLN: INTRAVENOUS | Status: DC

## 2012-11-27 MED ORDER — ONDANSETRON HCL 4 MG/2ML IJ SOLN: 4.0000 mg | Freq: Once | INTRAMUSCULAR | Status: DC

## 2012-11-27 MED ORDER — ONDANSETRON HCL 4 MG/2ML IJ SOLN: 4.0000 mg | Freq: Four times a day (QID) | INTRAMUSCULAR | Status: DC | PRN

## 2012-11-27 MED ORDER — HYDROMORPHONE HCL PF 1 MG/ML IJ SOLN: 0.5000 mg | INTRAMUSCULAR | Status: DC | PRN

## 2012-11-27 MED ORDER — LEVETIRACETAM 500 MG PO TABS
1000.0000 mg | ORAL_TABLET | Freq: Two times a day (BID) | ORAL | Status: DC
  Administered 2012-11-27 – 2012-11-28 (×2): 1000 mg via ORAL
  Filled 2012-11-27 (×4): qty 2

## 2012-11-27 MED ORDER — IBUPROFEN 800 MG PO TABS
400.0000 mg | ORAL_TABLET | Freq: Four times a day (QID) | ORAL | Status: DC | PRN
  Administered 2012-11-27: 400 mg via ORAL
  Filled 2012-11-27 (×2): qty 1

## 2012-11-27 MED ORDER — SODIUM CHLORIDE 0.9 % IV SOLN
INTRAVENOUS | Status: DC
  Administered 2012-11-27: 1000 mL via INTRAVENOUS

## 2012-11-27 MED ORDER — MAGNESIUM SULFATE 40 MG/ML IJ SOLN
4.0000 g | Freq: Once | INTRAMUSCULAR | Status: AC
  Administered 2012-11-27: 4 g via INTRAVENOUS
  Filled 2012-11-27: qty 100

## 2012-11-27 MED ORDER — POTASSIUM CHLORIDE CRYS ER 20 MEQ PO TBCR
20.0000 meq | EXTENDED_RELEASE_TABLET | Freq: Three times a day (TID) | ORAL | Status: DC
  Administered 2012-11-27 – 2012-11-28 (×4): 20 meq via ORAL
  Filled 2012-11-27 (×6): qty 1

## 2012-11-27 MED ORDER — SODIUM CHLORIDE 0.9 % IV SOLN
1000.0000 mg | Freq: Two times a day (BID) | INTRAVENOUS | Status: DC
  Administered 2012-11-27: 1000 mg via INTRAVENOUS
  Filled 2012-11-27 (×2): qty 10

## 2012-11-27 NOTE — ED Notes (Signed)
Patient transported to CT 

## 2012-11-27 NOTE — Progress Notes (Signed)
CARE MANAGEMENT NOTE 11/27/2012  Patient:  Eric Mueller, Eric Mueller   Account Number:  [id number]  Date Initiated:  11/27/2012  Documentation initiated by:  Latisa Belay  Subjective/Objective Assessment:   history of seizures with etoh abuse, takes keppra at home, had witnessed seizure at home by mother.     Action/Plan:   home once stable   Anticipated DC Date:  11/30/2012   Anticipated DC Plan:  HOME/SELF CARE  In-house referral  Clinical Social Worker      DC Planning Services  NA      [facility] Choice  NA   Choice offered to / List presented to:  NA   DME arranged  NA      DME agency  NA     HH arranged  NA      HH agency  NA   Status of service:  In process, will continue to follow Medicare Important Message given?  NA - LOS <3 / Initial given by admissions (If response is "NO", the following Medicare IM given date fields will be blank) Date Medicare IM given:   Date Additional Medicare IM given:    Discharge Disposition:    Per UR Regulation:  Reviewed for med. necessity/level of care/duration of stay  If discussed at Long Length of Stay Meetings, dates discussed:    Comments:  [id number]/Twyla Dais Earlene Plater RN, BSN, CCN: [phone] Case management. Chart reviewed for discharge planning and present needs. Discharge needs: none present at time of review. Next chart review due:  [id number]

## 2012-11-27 NOTE — Progress Notes (Signed)
Pt questioned during my assessment regarding his last alcoholic drink, he stated "he had 2 cherry vodkas yesterday, and that he was cutting back on ETOH because he feels their is a correlation with ETOH and his seizures".   He stated that he has not ate today and was doing strenuous activities and feels this led to the seizures today. Activities were cleaning out his mothers closet and clearing out a Psychiatrist.

## 2012-11-27 NOTE — Progress Notes (Signed)
[facility] PROGRESS NOTE  Eric Mueller WUJ:[id number] DOB: 1986/01/21 DOA: 11/26/2012 PCP: Lucilla Edin, MD Neurologist: Dr. Barrington Ellison, [facility]  Brief narrative: Eric Mueller is an 27 y.o. male with a PMH of seizures and alcohol abuse who was admitted on 11/27/2012 status post seizure witnessed by family. Upon initial evaluation in the ED, the patient was noted to be hypokalemic with a potassium of 2.6.  Assessment/Plan: Principal Problem:   Seizure disorder -Admitted and monitored in the step down unit overnight.  Awake and alert now, transfer to floor. -CT of the head unremarkable. -Keppra increased to 1000 mg twice a day (home dose 750 mg twice a day). -Provided with supportive care including IV fluids, and electrolyte supplementation.  KVO IVF. -Urine drug screen negative. Ethanol less than 11. -Neurologist notes from [facility] (Dr. Barrington Ellison) reviewed through Care Everywhere feature in EPIC. Active Problems:   Alcohol abuse -Placed on CIWA detox protocol with Ativan. -Supplement thiamine and folic acid. -Suspect he is minimizing his use.   Transaminitis -Likely from alcohol-induced liver damage.   Leukocytosis, unspecified -Likely a stress reaction to seizure event. Resolved.   Hypokalemia -Supplementing.    Hypomagnesemia -Will give 4 g of IV magnesium.  Code Status: Full. Family Communication: No family at bedside. Disposition Plan: Home when stable.   Medical Consultants:  None.  Other Consultants:  None.  Anti-infectives:  None.  HPI/Subjective: Eric Mueller tells me his seizures began at age 33 and he has a seizure every few months.  He tells me he cut down on his alcohol use at age 8 when his seizures began, and that he was a binge drinker.  He denies excessive alcohol use now, although admits to drinking two vodkas yesterday.  He is tremulous, and tells me he has had tremors since the age of 44.  He sees a neurologist at [facility]  whose notes were reviewed, and who mentioned increasing his Keppra to 1000 mg BID if he continues to have breakthrough seizures.  No seizures over night.  Had nausea and vomiting last night, resolved now.  Objective: Filed Vitals:   11/27/12 0300 11/27/12 0400 11/27/12 0500 11/27/12 0700  BP: 124/92 119/89 125/84 119/72  Pulse: 82 88 83 88  Temp:      TempSrc:      Resp: 18 20 19 21   Height:      Weight:      SpO2: 98% 97% 98% 98%    Intake/Output Summary (Last 24 hours) at 11/27/12 0830 Last data filed at 11/27/12 0700  Gross per 24 hour  Intake 1014.17 ml  Output    350 ml  Net 664.17 ml    Exam: Gen:  NAD Cardiovascular:  RRR, No M/R/G Respiratory:  Lungs CTAB Gastrointestinal:  Abdomen soft, NT/ND, + BS Extremities:  No C/E/C  Data Reviewed: Basic Metabolic Panel:  Recent Labs Lab 11/26/12 2250 11/27/12 0038 11/27/12 0348  NA 138 138 139  K 2.7* 2.6* 3.0*  CL 95* 88* 96  CO2  --  22 29  GLUCOSE 165* 148* 100*  BUN 4* 6 6  CREATININE 0.80 0.65 0.69  CALCIUM  --  9.4 8.3*  MG  --   --  1.4*   GFR Estimated Creatinine Clearance: 129.9 ml/min (by C-G formula based on Cr of 0.69). Liver Function Tests:  Recent Labs Lab 11/27/12 0038 11/27/12 0348  AST 367* 260*  ALT 161* 122*  ALKPHOS 136* 106  BILITOT 4.1* 4.1*  PROT 7.4  5.7*  ALBUMIN 4.0 3.1*    Recent Labs Lab 11/27/12 0038  LIPASE 27   CBC:  Recent Labs Lab 11/26/12 2250 11/27/12 0038 11/27/12 0348  WBC  --  14.3* 8.0  HGB 16.3 15.0 12.2*  HCT 48.0 42.2 34.1*  MCV  --  101.0* 100.0  PLT  --  237 110*   CBG:  Recent Labs Lab 11/26/12 2204  GLUCAP 168*   Microbiology Recent Results (from the past 240 hour(s))  MRSA PCR SCREENING     Status: None   Collection Time    11/27/12  2:57 AM      Result Value Range Status   MRSA by PCR NEGATIVE  NEGATIVE Final   Comment:            The GeneXpert MRSA Assay (FDA     approved for NASAL specimens     only), is one component of a      comprehensive MRSA colonization     surveillance program. It is not     intended to diagnose MRSA     infection nor to guide or     monitor treatment for     MRSA infections.     Procedures and Diagnostic Studies: Dg Chest 1 View  11/27/2012   *RADIOLOGY REPORT*  Clinical Data: Seizures.  CHEST - 1 VIEW  Comparison: 05/10/2012.  Findings: Low volume chest. Monitoring leads are projected over the chest.  No focal opacification.  No pleural effusion. Cardiopericardial silhouette appears within normal limits allowing for volumes.  IMPRESSION: Low volume chest.   Original Report Authenticated By: Andreas Newport, M.D.   Ct Head Wo Contrast  11/27/2012   *RADIOLOGY REPORT*  Clinical Data: Seizures.  CT HEAD WITHOUT CONTRAST  Technique:  Contiguous axial images were obtained from the base of the skull through the vertex without contrast.  Comparison: None.  Findings: No mass lesion, mass effect, midline shift, hydrocephalus, hemorrhage.  No territorial ischemia or acute infarction.  Nine basal ganglia calcifications are present. Paranasal sinuses and mastoid air cells appear within normal limits.  IMPRESSION: Negative CT head.   Original Report Authenticated By: Andreas Newport, M.D.    Scheduled Meds: . levETIRAcetam  1,000 mg Intravenous Q12H  . magnesium sulfate 1 - 4 g bolus IVPB  4 g Intravenous Once  . potassium chloride  20 mEq Oral TID   Continuous Infusions: . sodium chloride 1,000 mL (11/27/12 0302)    Time spent: 35 minutes with > 50% of time spent ascertaining HPI, reviewing outside records, discussing the current diagnostic impression, plan of care with the patient.   LOS: 1 day   Mueller,Eric  [facility] Pager [phone].   *Please note that the hospitalists switch teams on Wednesdays. Please call the flow manager at [phone] if you are having difficulty reaching the hospitalist taking care of this patient as she can update you and provide the most up-to-date  pager number of provider caring for the patient. If 8PM-8AM, please contact night-coverage at [email or web address], password [email or web address]  11/27/2012, 8:30 AM

## 2012-11-27 NOTE — ED Provider Notes (Signed)
CSN: [id number]     Arrival date & time 11/26/12  2156 History     First MD Initiated Contact with Patient 11/26/12 2235     Chief Complaint  Patient presents with  . Seizures   (Consider location/radiation/quality/duration/timing/severity/associated sxs/prior Treatment) Patient is a 27 y.o. male presenting with seizures.  Seizures  History provided by patients mother, has Sz history and is followed by [facility], is prescribed Keppra, has h/o pancreatitis in the past. PT continues to drink alcohol, he lives with his mother.  He typically has a seizure once every 6 months or so.  He had Sz tonight despite taking home medications. He had another seizure around 9pm. No known sleep deprivation or recent illness. Cough/ fevers. Mother called his physician and dose Keprra increased, but he had another seizure so she brings him to the ER. He had one witnessed Sz in the Er prior to my evaluation.  Past Medical History  Diagnosis Date  . Seizure disorder   . Seizures   . Allergy   . Anxiety    Past Surgical History  Procedure Laterality Date  . Wisdom tooth extraction     Family History  Problem Relation Age of Onset  . Heart disease      cardiovascular disease  . Cancer     History  Substance Use Topics  . Smoking status: Never Smoker   . Smokeless tobacco: Never Used  . Alcohol Use: Yes     Comment: social    Review of Systems  Constitutional: Negative for fever and chills.  HENT: Negative for neck pain and neck stiffness.   Eyes: Negative for visual disturbance.  Respiratory: Negative for shortness of breath.   Cardiovascular: Negative for chest pain.  Gastrointestinal: Negative for abdominal pain.  Genitourinary: Negative for dysuria.  Musculoskeletal: Negative for back pain.  Skin: Negative for rash.  Neurological: Positive for seizures. Negative for headaches.  All other systems reviewed and are negative.    Allergies  Oxycodone  Home Medications  No current  outpatient prescriptions on file. BP 138/88  Pulse 96  Temp(Src) 98.3 F (36.8 C)  Resp 16  Wt 150 lb (68.04 kg)  BMI 21.84 kg/m2  SpO2 99% Physical Exam  Constitutional: He is oriented to person, place, and time. He appears well-developed and well-nourished.  HENT:  Head: Normocephalic and atraumatic.  Eyes: EOM are normal. Pupils are equal, round, and reactive to light. Scleral icterus is present.  Neck: Neck supple.  No c spine tenderness or deformity  Cardiovascular: Regular rhythm and intact distal pulses.   tachycardic  Pulmonary/Chest: Effort normal. No respiratory distress.  Abdominal: Soft. Bowel sounds are normal. He exhibits no distension. There is no tenderness.  Musculoskeletal: Normal range of motion. He exhibits no edema and no tenderness.  Neurological: He is alert and oriented to person, place, and time. He displays normal reflexes. No cranial nerve deficit. Coordination normal.  Skin: Skin is warm and dry.    ED Course   Procedures (including critical care time)  Results for orders placed during the hospital encounter of 11/26/12  GLUCOSE, CAPILLARY      Result Value Range   Glucose-Capillary 168 (*) 70 - 99 mg/dL  CBC      Result Value Range   WBC 14.3 (*) 4.0 - 10.5 K/uL   RBC 4.18 (*) 4.22 - 5.81 MIL/uL   Hemoglobin 15.0  13.0 - 17.0 g/dL   HCT 40.9  81.1 - 91.4 %   MCV 101.0 (*)  78.0 - 100.0 fL   MCH 35.9 (*) 26.0 - 34.0 pg   MCHC 35.5  30.0 - 36.0 g/dL   RDW 16.1  09.6 - 04.5 %   Platelets 237  150 - 400 K/uL  COMPREHENSIVE METABOLIC PANEL      Result Value Range   Sodium 138  135 - 145 mEq/L   Potassium 2.6 (*) 3.5 - 5.1 mEq/L   Chloride 88 (*) 96 - 112 mEq/L   CO2 22  19 - 32 mEq/L   Glucose, Bld 148 (*) 70 - 99 mg/dL   BUN 6  6 - 23 mg/dL   Creatinine, Ser 4.09  0.50 - 1.35 mg/dL   Calcium 9.4  8.4 - 81.1 mg/dL   Total Protein 7.4  6.0 - 8.3 g/dL   Albumin 4.0  3.5 - 5.2 g/dL   AST 914 (*) 0 - 37 U/L   ALT 161 (*) 0 - 53 U/L    Alkaline Phosphatase 136 (*) 39 - 117 U/L   Total Bilirubin 4.1 (*) 0.3 - 1.2 mg/dL   GFR calc non Af Amer >90  >90 mL/min   GFR calc Af Amer >90  >90 mL/min  LIPASE, BLOOD      Result Value Range   Lipase 27  11 - 59 U/L  POCT I-STAT, CHEM 8      Result Value Range   Sodium 138  135 - 145 mEq/L   Potassium 2.7 (*) 3.5 - 5.1 mEq/L   Chloride 95 (*) 96 - 112 mEq/L   BUN 4 (*) 6 - 23 mg/dL   Creatinine, Ser 7.82  0.50 - 1.35 mg/dL   Glucose, Bld 956 (*) 70 - 99 mg/dL   Calcium, Ion 2.13 (*) 1.12 - 1.23 mmol/L   TCO2 22  0 - 100 mmol/L   Hemoglobin 16.3  13.0 - 17.0 g/dL   HCT 08.6  57.8 - 46.9 %   Comment NOTIFIED PHYSICIAN     Dg Chest 1 View  11/27/2012   *RADIOLOGY REPORT*  Clinical Data: Seizures.  CHEST - 1 VIEW  Comparison: 05/10/2012.  Findings: Low volume chest. Monitoring leads are projected over the chest.  No focal opacification.  No pleural effusion. Cardiopericardial silhouette appears within normal limits allowing for volumes.  IMPRESSION: Low volume chest.   Original Report Authenticated By: Andreas Newport, M.D.   Ct Head Wo Contrast  11/27/2012   *RADIOLOGY REPORT*  Clinical Data: Seizures.  CT HEAD WITHOUT CONTRAST  Technique:  Contiguous axial images were obtained from the base of the skull through the vertex without contrast.  Comparison: None.  Findings: No mass lesion, mass effect, midline shift, hydrocephalus, hemorrhage.  No territorial ischemia or acute infarction.  Nine basal ganglia calcifications are present. Paranasal sinuses and mastoid air cells appear within normal limits.  IMPRESSION: Negative CT head.   Original Report Authenticated By: Andreas Newport, M.D.     Date: 11/27/2012  Rate: 114  Rhythm: sinus tachycardia  QRS Axis: normal  Intervals: normal  ST/T Wave abnormalities: nonspecific ST changes  Conduction Disutrbances:none  Narrative Interpretation:   Old EKG Reviewed: no sig changes     CRITICAL CARE Performed by: Sunnie Nielsen Total  critical care time: 30 Critical care time was exclusive of separately billable procedures and treating other patients. Critical care was necessary to treat or prevent imminent or life-threatening deterioration. Critical care was time spent personally by me on the following activities: development of treatment plan with patient and/or surrogate as well  as nursing, discussions with consultants, evaluation of patient's response to treatment, examination of patient, obtaining history from patient or surrogate, ordering and performing treatments and interventions, ordering and review of laboratory studies, ordering and review of radiographic studies, pulse oximetry and re-evaluation of patient's condition.recurrent witnessed seizures requiring IV ativan, IVFs, IV Keppra, supp Oxygen, serial re-evaluations, multiple conversations with PTs mother and MED consult for admit to SDU. PT stays post ictal for a short period and wakes up and converses appropriately/ protects his airway.  IV potassium runs for sig hypokalemia. No ABD tenderness serial ABD exams.   MDM  Recurrent Sz activity atypical for PTs seizure patterns. He is a drinker with h/o pancreatitis.   IVFs, IV ativan and medications provided  Witnessed Sz activity in ER requiring multiple bedside evaluations  Abnormal labs, low potassium, elevated LFTs, elevated bili, normal lipase, leukocytosis  CT head and CXR reviewed. Screening ECG sinus tach  MED admit to SDU  Sunnie Nielsen, MD 11/27/12 [phone]

## 2012-11-27 NOTE — H&P (Signed)
[facility] History and Physical  Eric Mueller JXB:[id number] DOB: [birth year] DOA: 11/26/2012  Referring physician: ED physician PCP: No primary provider on file.   Chief Complaint: Seizures   HPI:  Pt is 27 yo male with history of seizures and alcohol abuse presented to [facility] ED with main concern of one episode of seizure at home witnessed by his mother. Please note that pt unable to provide history at this time and no family member at the bedside. PT apparently continues to drink alcohol and typically has seizures once ever 6 months. He is on Keppra 750 mg BID and there is questionable compliance. Pt has had 3 other episodes of seizures witnessed in ED. [facility] asked to admit to SDU for further evaluation. He had BMP checked and it showed K 2.6 and he was given total of 4 runs of KCL IV.   Assessment and Plan:  Active Problems:   Seizure disorder - unclear etiology at this time, ? Related to alcohol use - will admit to SDU for now - place on CIWA protocol and increase the dose of Keppra to 1000 mg IV BID (Home dose 750 mg BID PO) - supportive care with IVF, antiemetics and supplement electrolytes as indicated  - check UDS and UA, alcohol level    Transaminitis - in the pattern of alcohol abuse - will check alcohol level - CMET in AM   Hypokalemia - supplement via IV x 6 runs of KCL - 4 runs given in ED - will check Mg level as well, repeat BMP in AM   Alcohol abuse - place on CIWA protocol for now   Leukocytosis, unspecified - likely secondary to seizure episode - UA pending, no need for ABX at this time as no clear infectious etiology noted   Code Status: Full Family Communication: No family at bedside  Disposition Plan: Admission to SDU   Review of Systems:  Unable to obtain due to post ictal state     Past Medical History  Diagnosis Date  . Seizure disorder   . Seizures   . Allergy   . Anxiety     Past Surgical History  Procedure Laterality Date  . Wisdom  tooth extraction      Social History:  reports that he has never smoked. He has never used smokeless tobacco. He reports that  drinks alcohol. He reports that he does not use illicit drugs.  Allergies  Allergen Reactions  . Oxycodone Nausea And Vomiting    Family History  Problem Relation Age of Onset  . Heart disease      cardiovascular disease  . Cancer      Medication Sig  ibuprofen (ADVIL,MOTRIN) 200 MG tablet Take 400 mg by mouth every 6 (six) hours as needed for pain.  levETIRAcetam (KEPPRA) 750 MG tablet Take 1 tablet (750 mg total) by mouth every 12 (twelve) hours.  Multiple Vitamin (MULTIVITAMIN WITH MINERALS) TABS Take 1 tablet by mouth daily.  Potassium 95 MG TABS Take 1 tablet by mouth daily.  loratadine (CLARITIN) 10 MG tablet Take 10 mg by mouth daily as needed for allergies.    Physical Exam: Filed Vitals:   11/26/12 2201 11/26/12 2202 11/26/12 2300 11/27/12 0038  BP:  135/91 130/92 138/88  Pulse:  117 96   Temp: 98.3 F (36.8 C)     Resp:  17 21 16   Weight:  68.04 kg (150 lb)    SpO2:  99% 95% 99%    Physical Exam  Constitutional:  Appears well-developed and well-nourished. No distress.  HENT: Normocephalic. External right and left ear normal. Dry MM Eyes: Conjunctivae and EOM are normal. PERRLA, no scleral icterus.  Neck: Normal ROM. Neck supple. No JVD. No tracheal deviation. No thyromegaly.  CVS: Regular rhythm, tachycardic, S1/S2 +, no murmurs, no gallops, no carotid bruit.  Pulmonary: Effort and breath sounds normal, no stridor, diminished air movement bilaterally  Abdominal: Soft. BS +,  no distension, tenderness, rebound or guarding.  Musculoskeletal: Normal range of motion. No edema and no tenderness.  Lymphadenopathy: No lymphadenopathy noted, cervical, inguinal. Neuro: Post ictal, easy to arouse Skin: Skin is warm and dry. No rash noted. Not diaphoretic. No erythema. No pallor.  Psychiatric: Unable to assess due to post ictal state   Labs on  Admission:  Basic Metabolic Panel:  Recent Labs Lab 11/26/12 2250 11/27/12 0038  NA 138 138  K 2.7* 2.6*  CL 95* 88*  CO2  --  22  GLUCOSE 165* 148*  BUN 4* 6  CREATININE 0.80 0.65  CALCIUM  --  9.4   Liver Function Tests:  Recent Labs Lab 11/27/12 0038  AST 367*  ALT 161*  ALKPHOS 136*  BILITOT 4.1*  PROT 7.4  ALBUMIN 4.0    Recent Labs Lab 11/27/12 0038  LIPASE 27   CBC:  Recent Labs Lab 11/26/12 2250 11/27/12 0038  WBC  --  14.3*  HGB 16.3 15.0  HCT 48.0 42.2  MCV  --  101.0*  PLT  --  237   CBG:  Recent Labs Lab 11/26/12 2204  GLUCAP 168*    Radiological Exams on Admission: Dg Chest 1 View  11/27/2012   *RADIOLOGY REPORT*  Clinical Data: Seizures.  CHEST - 1 VIEW  Comparison: 05/10/2012.  Findings: Low volume chest. Monitoring leads are projected over the chest.  No focal opacification.  No pleural effusion. Cardiopericardial silhouette appears within normal limits allowing for volumes.  IMPRESSION: Low volume chest.   Original Report Authenticated By: Andreas Newport, M.D.   Ct Head Wo Contrast  11/27/2012   *RADIOLOGY REPORT*  Clinical Data: Seizures.  CT HEAD WITHOUT CONTRAST  Technique:  Contiguous axial images were obtained from the base of the skull through the vertex without contrast.  Comparison: None.  Findings: No mass lesion, mass effect, midline shift, hydrocephalus, hemorrhage.  No territorial ischemia or acute infarction.  Nine basal ganglia calcifications are present. Paranasal sinuses and mastoid air cells appear within normal limits.  IMPRESSION: Negative CT head.   Original Report Authenticated By: Andreas Newport, M.D.    EKG: Normal sinus rhythm, no ST/T wave changes  Debbora Presto, MD  [facility] Pager [phone]  If 7PM-7AM, please contact night-coverage [email or web address] Password [facility] 11/27/2012, 2:32 AM

## 2012-11-28 DIAGNOSIS — K140 Glossitis: Secondary | ICD-10-CM | POA: Diagnosis present

## 2012-11-28 MED ORDER — MAGIC MOUTHWASH W/LIDOCAINE: 10.0000 mL | Freq: Four times a day (QID) | ORAL | Status: DC | PRN

## 2012-11-28 MED ORDER — POTASSIUM CHLORIDE CRYS ER 20 MEQ PO TBCR
40.0000 meq | EXTENDED_RELEASE_TABLET | Freq: Once | ORAL | Status: DC
  Filled 2012-11-28: qty 2

## 2012-11-28 MED ORDER — LEVETIRACETAM 1000 MG PO TABS: 1000.0000 mg | ORAL_TABLET | Freq: Two times a day (BID) | ORAL | Status: DC

## 2012-11-28 MED ORDER — MAGIC MOUTHWASH W/LIDOCAINE
10.0000 mL | Freq: Four times a day (QID) | ORAL | Status: DC
  Filled 2012-11-28 (×4): qty 10

## 2012-11-28 MED ORDER — POTASSIUM CHLORIDE CRYS ER 20 MEQ PO TBCR: 20.0000 meq | EXTENDED_RELEASE_TABLET | Freq: Two times a day (BID) | ORAL | Status: DC

## 2012-11-28 NOTE — Progress Notes (Signed)
Patient given discharge instructions and he verbalized understanding using teach back method.  Patient stable for discharge home.  Patient understands to keep follow up appointments and when he would need to notify the MD.  Eric Mueller  11/28/2012

## 2012-11-28 NOTE — Discharge Summary (Signed)
Physician Discharge Summary  Eric Mueller ZOX:[id number] DOB: 09/02/1985 DOA: 11/26/2012  PCP: Lucilla Edin, MD  Admit date: 11/26/2012 Discharge date: 11/28/2012  Recommendations for Outpatient Follow-up:  1. Re-check potassium and liver function tests at next visit.  Placed on oral potassium supplementation.  Discharge Diagnoses:  Principal Problem:    Seizure disorder Active Problems:    Alcohol abuse    Transaminitis    Leukocytosis, unspecified    Hypokalemia    Hypomagnesemia    Tongue trauma  Discharge Condition: Improved  Diet recommendation: Regular.  History of present illness:  Eric Mueller is an 27 y.o. male with a PMH of seizures and alcohol abuse who was admitted on 11/27/2012 status post seizure witnessed by family. Upon initial evaluation in the ED, the patient was noted to be hypokalemic with a potassium of 2.6.  Hospital Course by problem:  Principal Problem:  Seizure disorder  -Admitted and monitored in the step down unit until stable. Awake and alert for past 24 hours.  -CT of the head unremarkable.  -Keppra increased to 1000 mg twice a day (home dose 750 mg twice a day).  -Provided with supportive care including IV fluids, and electrolyte supplementation. -Urine drug screen negative. Ethanol less than 11.  -Neurologist notes from [facility] (Dr. Barrington Ellison) reviewed through Care Everywhere feature in [facility].  Active Problems:  Tongue trauma -Magic mouthwash with lidocaine prescribed. Alcohol abuse  -Placed on CIWA detox protocol with Ativan. No evidence of ongoing withdrawal. -Supplement thiamine and folic acid.  -Suspect he is minimizing his use.  -Encouraged to discontinue alcohol use. Transaminitis  -Likely from alcohol-induced liver damage. Recommend rechecking at next visit. Leukocytosis, unspecified  -Likely a stress reaction to seizure event. Resolved.  Hypokalemia  -Supplementing. Given 40 mEq of oral replacement therapy prior  to discharge and was placed on 20 mEq twice a day post discharge. Will need a followup potassium level checked by his PCP. Hypomagnesemia  -Given 4 g of IV magnesium.   Discharge Exam: Filed Vitals:   11/28/12 0620  BP: 136/98  Pulse: 91  Temp: 98.9 F (37.2 C)  Resp: 20   Filed Vitals:   11/27/12 1500 11/27/12 2239 11/28/12 0313 11/28/12 0620  BP: 130/96 152/93  136/98  Pulse: 88 88  91  Temp: 98.5 F (36.9 C) 99.8 F (37.7 C) 98.1 F (36.7 C) 98.9 F (37.2 C)  TempSrc: Oral Oral Oral Oral  Resp: 18 20  20   Height: 5\' 11"  (1.803 m)     Weight: 68.04 kg (150 lb)     SpO2: 100% 100%  100%    Gen:  NAD Oropharynx: Large hematoma left tongue. Cardiovascular:  RRR, No M/R/G Respiratory: Lungs CTAB Gastrointestinal: Abdomen soft, NT/ND with normal active bowel sounds. Extremities: No C/E/C   Discharge Instructions      Discharge Orders   Future Orders Complete By Expires     Call MD for:  As directed     Scheduling Instructions:      Recurrent seizure activity, decreased urine output with dark urine.    Diet general  As directed     Discharge instructions  As directed     Comments:      -Your Keppra dose has been increased to 1000 mg every 12 hours. -No driving for at least 6 months. -Avoid alcohol. -Have your doctor re-check your potassium levels at your next visit.  You were cared for by Dr. Hillery Aldo  (a hospitalist) during your hospital  stay. If you have any questions about your discharge medications or the care you received while you were in the hospital after you are discharged, you can call the unit and ask to speak with the hospitalist on call if the hospitalist that took care of you is not available. Once you are discharged, your primary care physician will handle any further medical issues. Please note that NO REFILLS for any discharge medications will be authorized once you are discharged, as it is imperative that you return to your primary care  physician (or establish a relationship with a primary care physician if you do not have one) for your aftercare needs so that they can reassess your need for medications and monitor your lab values.  Any outstanding tests can be reviewed by your PCP at your follow up visit.  It is also important to review any medicine changes with your PCP.  Please bring these d/c instructions with you to your next visit so your physician can review these changes with you.  If you do not have a primary care physician, you can call [phone] for a physician referral.  It is highly recommended that you obtain a PCP for hospital follow up.    Driving Restrictions  As directed     Comments:      No driving until you have been seizure-free for at least 6 months.    Increase activity slowly  As directed         Medication List    STOP taking these medications       Potassium 95 MG Tabs      TAKE these medications       ibuprofen 200 MG tablet  Commonly known as:  ADVIL,MOTRIN  Take 400 mg by mouth every 6 (six) hours as needed for pain.     levETIRAcetam 1000 MG tablet  Commonly known as:  KEPPRA  Take 1 tablet (1,000 mg total) by mouth every 12 (twelve) hours.     loratadine 10 MG tablet  Commonly known as:  CLARITIN  Take 10 mg by mouth daily as needed for allergies.     magic mouthwash w/lidocaine Soln  Take 10 mLs by mouth 4 (four) times daily as needed.     multivitamin with minerals Tabs  Take 1 tablet by mouth daily.     potassium chloride SA 20 MEQ tablet  Commonly known as:  K-DUR,KLOR-CON  Take 1 tablet (20 mEq total) by mouth 2 (two) times daily.          The results of significant diagnostics from this hospitalization (including imaging, microbiology, ancillary and laboratory) are listed below for reference.    Significant Diagnostic Studies: Dg Chest 1 View  11/27/2012   *RADIOLOGY REPORT*  Clinical Data: Seizures.  CHEST - 1 VIEW  Comparison: 05/10/2012.  Findings: Low volume  chest. Monitoring leads are projected over the chest.  No focal opacification.  No pleural effusion. Cardiopericardial silhouette appears within normal limits allowing for volumes.  IMPRESSION: Low volume chest.   Original Report Authenticated By: Andreas Newport, M.D.   Ct Head Wo Contrast  11/27/2012   *RADIOLOGY REPORT*  Clinical Data: Seizures.  CT HEAD WITHOUT CONTRAST  Technique:  Contiguous axial images were obtained from the base of the skull through the vertex without contrast.  Comparison: None.  Findings: No mass lesion, mass effect, midline shift, hydrocephalus, hemorrhage.  No territorial ischemia or acute infarction.  Nine basal ganglia calcifications are present. Paranasal sinuses and mastoid air  cells appear within normal limits.  IMPRESSION: Negative CT head.   Original Report Authenticated By: Andreas Newport, M.D.    Labs:  Basic Metabolic Panel:  Recent Labs Lab 11/26/12 2250 11/27/12 0038 11/27/12 0348  NA 138 138 139  K 2.7* 2.6* 3.0*  CL 95* 88* 96  CO2  --  22 29  GLUCOSE 165* 148* 100*  BUN 4* 6 6  CREATININE 0.80 0.65 0.69  CALCIUM  --  9.4 8.3*  MG  --   --  1.4*   GFR Estimated Creatinine Clearance: 132.2 ml/min (by C-G formula based on Cr of 0.69). Liver Function Tests:  Recent Labs Lab 11/27/12 0038 11/27/12 0348  AST 367* 260*  ALT 161* 122*  ALKPHOS 136* 106  BILITOT 4.1* 4.1*  PROT 7.4 5.7*  ALBUMIN 4.0 3.1*    Recent Labs Lab 11/27/12 0038  LIPASE 27    CBC:  Recent Labs Lab 11/26/12 2250 11/27/12 0038 11/27/12 0348  WBC  --  14.3* 8.0  HGB 16.3 15.0 12.2*  HCT 48.0 42.2 34.1*  MCV  --  101.0* 100.0  PLT  --  237 110*   CBG:  Recent Labs Lab 11/26/12 2204  GLUCAP 168*   Microbiology Recent Results (from the past 240 hour(s))  MRSA PCR SCREENING     Status: None   Collection Time    11/27/12  2:57 AM      Result Value Range Status   MRSA by PCR NEGATIVE  NEGATIVE Final   Comment:            The GeneXpert MRSA  Assay (FDA     approved for NASAL specimens     only), is one component of a     comprehensive MRSA colonization     surveillance program. It is not     intended to diagnose MRSA     infection nor to guide or     monitor treatment for     MRSA infections.    Time coordinating discharge: 35 minutes.  Signed:  Emyah Roznowski  Pager [phone] [facility] 11/28/2012, 10:56 AM

## 2013-01-13 ENCOUNTER — Encounter (HOSPITAL_COMMUNITY): Payer: Self-pay | Admitting: Emergency Medicine

## 2013-01-13 ENCOUNTER — Inpatient Hospital Stay (HOSPITAL_COMMUNITY)
Admission: EM | Admit: 2013-01-13 | Discharge: 2013-02-02 | DRG: 557 | Disposition: A | Discharge status: Home / Self Care | Payer: BC Managed Care – PPO | Attending: Internal Medicine | Admitting: Internal Medicine

## 2013-01-13 ENCOUNTER — Inpatient Hospital Stay (HOSPITAL_COMMUNITY): Payer: BC Managed Care – PPO

## 2013-01-13 DIAGNOSIS — E162 Hypoglycemia, unspecified: Secondary | ICD-10-CM | POA: Diagnosis not present

## 2013-01-13 DIAGNOSIS — F10939 Alcohol use, unspecified with withdrawal, unspecified: Secondary | ICD-10-CM | POA: Diagnosis present

## 2013-01-13 DIAGNOSIS — D689 Coagulation defect, unspecified: Secondary | ICD-10-CM | POA: Diagnosis present

## 2013-01-13 DIAGNOSIS — F10239 Alcohol dependence with withdrawal, unspecified: Secondary | ICD-10-CM | POA: Diagnosis present

## 2013-01-13 DIAGNOSIS — K140 Glossitis: Secondary | ICD-10-CM | POA: Diagnosis present

## 2013-01-13 DIAGNOSIS — E876 Hypokalemia: Secondary | ICD-10-CM | POA: Diagnosis present

## 2013-01-13 DIAGNOSIS — K7682 Hepatic encephalopathy: Secondary | ICD-10-CM | POA: Diagnosis not present

## 2013-01-13 DIAGNOSIS — W19XXXA Unspecified fall, initial encounter: Secondary | ICD-10-CM | POA: Diagnosis present

## 2013-01-13 DIAGNOSIS — D539 Nutritional anemia, unspecified: Secondary | ICD-10-CM | POA: Diagnosis present

## 2013-01-13 DIAGNOSIS — K859 Acute pancreatitis without necrosis or infection, unspecified: Secondary | ICD-10-CM

## 2013-01-13 DIAGNOSIS — T07XXXA Unspecified multiple injuries, initial encounter: Secondary | ICD-10-CM | POA: Diagnosis present

## 2013-01-13 DIAGNOSIS — G40309 Generalized idiopathic epilepsy and epileptic syndromes, not intractable, without status epilepticus: Secondary | ICD-10-CM | POA: Diagnosis present

## 2013-01-13 DIAGNOSIS — R7402 Elevation of levels of lactic acid dehydrogenase (LDH): Secondary | ICD-10-CM | POA: Diagnosis present

## 2013-01-13 DIAGNOSIS — F10231 Alcohol dependence with withdrawal delirium: Secondary | ICD-10-CM | POA: Diagnosis not present

## 2013-01-13 DIAGNOSIS — J810 Acute pulmonary edema: Secondary | ICD-10-CM

## 2013-01-13 DIAGNOSIS — K7290 Hepatic failure, unspecified without coma: Secondary | ICD-10-CM

## 2013-01-13 DIAGNOSIS — J9601 Acute respiratory failure with hypoxia: Secondary | ICD-10-CM | POA: Diagnosis not present

## 2013-01-13 DIAGNOSIS — D72829 Elevated white blood cell count, unspecified: Secondary | ICD-10-CM

## 2013-01-13 DIAGNOSIS — R569 Unspecified convulsions: Secondary | ICD-10-CM

## 2013-01-13 DIAGNOSIS — E43 Unspecified severe protein-calorie malnutrition: Secondary | ICD-10-CM | POA: Insufficient documentation

## 2013-01-13 DIAGNOSIS — I509 Heart failure, unspecified: Secondary | ICD-10-CM | POA: Diagnosis not present

## 2013-01-13 DIAGNOSIS — F1020 Alcohol dependence, uncomplicated: Secondary | ICD-10-CM | POA: Diagnosis present

## 2013-01-13 DIAGNOSIS — I5021 Acute systolic (congestive) heart failure: Secondary | ICD-10-CM | POA: Diagnosis not present

## 2013-01-13 DIAGNOSIS — F10931 Alcohol use, unspecified with withdrawal delirium: Secondary | ICD-10-CM | POA: Diagnosis not present

## 2013-01-13 DIAGNOSIS — D696 Thrombocytopenia, unspecified: Secondary | ICD-10-CM | POA: Diagnosis present

## 2013-01-13 DIAGNOSIS — K7010 Alcoholic hepatitis without ascites: Principal | ICD-10-CM | POA: Diagnosis present

## 2013-01-13 DIAGNOSIS — T380X5A Adverse effect of glucocorticoids and synthetic analogues, initial encounter: Secondary | ICD-10-CM | POA: Diagnosis present

## 2013-01-13 DIAGNOSIS — F1010 Alcohol abuse, uncomplicated: Secondary | ICD-10-CM | POA: Diagnosis present

## 2013-01-13 DIAGNOSIS — R7401 Elevation of levels of liver transaminase levels: Secondary | ICD-10-CM | POA: Diagnosis present

## 2013-01-13 DIAGNOSIS — J90 Pleural effusion, not elsewhere classified: Secondary | ICD-10-CM | POA: Diagnosis present

## 2013-01-13 DIAGNOSIS — G40909 Epilepsy, unspecified, not intractable, without status epilepticus: Secondary | ICD-10-CM | POA: Diagnosis present

## 2013-01-13 DIAGNOSIS — R160 Hepatomegaly, not elsewhere classified: Secondary | ICD-10-CM | POA: Diagnosis present

## 2013-01-13 DIAGNOSIS — K769 Liver disease, unspecified: Secondary | ICD-10-CM

## 2013-01-13 DIAGNOSIS — J9600 Acute respiratory failure, unspecified whether with hypoxia or hypercapnia: Secondary | ICD-10-CM | POA: Diagnosis not present

## 2013-01-13 DIAGNOSIS — K759 Inflammatory liver disease, unspecified: Secondary | ICD-10-CM | POA: Diagnosis present

## 2013-01-13 DIAGNOSIS — I426 Alcoholic cardiomyopathy: Secondary | ICD-10-CM | POA: Diagnosis not present

## 2013-01-13 LAB — BASIC METABOLIC PANEL
Calcium: 8.7 mg/dL (ref 8.4–10.5)
Creatinine, Ser: 0.48 mg/dL — ABNORMAL LOW (ref 0.50–1.35)
GFR calc Af Amer: >90 mL/min (ref >90)
GFR calc non Af Amer: >90 mL/min (ref >90)

## 2013-01-13 LAB — PROTIME-INR
INR: 1.82 — ABNORMAL HIGH (ref 0.00–1.49)
Prothrombin Time: 20.5 seconds — ABNORMAL HIGH (ref 11.6–15.2)

## 2013-01-13 LAB — URINE MICROSCOPIC-ADD ON

## 2013-01-13 LAB — CBC WITH DIFFERENTIAL/PLATELET
Eosinophils Absolute: 0 10*3/uL (ref 0.0–0.7)
HCT: 31 % — ABNORMAL LOW (ref 39.0–52.0)
Hemoglobin: 10.9 g/dL — ABNORMAL LOW (ref 13.0–17.0)
Lymphs Abs: 0.7 10*3/uL (ref 0.7–4.0)
MCH: 35.9 pg — ABNORMAL HIGH (ref 26.0–34.0)
Monocytes Absolute: 1 10*3/uL (ref 0.1–1.0)
Monocytes Relative: 16 % — ABNORMAL HIGH (ref 3–12)
Neutrophils Relative %: 73 % (ref 43–77)
RBC: 3.04 MIL/uL — ABNORMAL LOW (ref 4.22–5.81)

## 2013-01-13 LAB — URINALYSIS, ROUTINE W REFLEX MICROSCOPIC
Glucose, UA: NEGATIVE mg/dL
Hgb urine dipstick: NEGATIVE
Ketones, ur: 15 mg/dL — AB
pH: 7.5 (ref 5.0–8.0)

## 2013-01-13 LAB — MAGNESIUM: Magnesium: 1.3 mg/dL — ABNORMAL LOW (ref 1.5–2.5)

## 2013-01-13 LAB — ETHANOL: Alcohol, Ethyl (B): <11 mg/dL (ref 0–11)

## 2013-01-13 LAB — HEPATIC FUNCTION PANEL
Indirect Bilirubin: 4.4 mg/dL — ABNORMAL HIGH (ref 0.3–0.9)
Total Protein: 6.1 g/dL (ref 6.0–8.3)

## 2013-01-13 LAB — CG4 I-STAT (LACTIC ACID): Lactic Acid, Venous: 7.94 mmol/L — ABNORMAL HIGH (ref 0.5–2.2)

## 2013-01-13 MED ORDER — SODIUM CHLORIDE 0.9 % IV SOLN
INTRAVENOUS | Status: DC
  Administered 2013-01-14 – 2013-01-15 (×5): via INTRAVENOUS

## 2013-01-13 MED ORDER — LORAZEPAM 2 MG/ML IJ SOLN
1.0000 mg | Freq: Four times a day (QID) | INTRAMUSCULAR | Status: DC | PRN
  Filled 2013-01-13 (×2): qty 1

## 2013-01-13 MED ORDER — ADULT MULTIVITAMIN W/MINERALS CH
1.0000 | ORAL_TABLET | Freq: Every day | ORAL | Status: DC
  Administered 2013-01-14 – 2013-02-02 (×18): 1 via ORAL
  Filled 2013-01-13 (×20): qty 1

## 2013-01-13 MED ORDER — POTASSIUM CHLORIDE CRYS ER 20 MEQ PO TBCR
40.0000 meq | EXTENDED_RELEASE_TABLET | ORAL | Status: AC
  Administered 2013-01-13 – 2013-01-14 (×2): 40 meq via ORAL
  Filled 2013-01-13 (×2): qty 2

## 2013-01-13 MED ORDER — VITAMIN B-1 100 MG PO TABS
100.0000 mg | ORAL_TABLET | Freq: Every day | ORAL | Status: DC
  Administered 2013-01-14 – 2013-02-02 (×17): 100 mg via ORAL
  Filled 2013-01-13 (×20): qty 1

## 2013-01-13 MED ORDER — ONDANSETRON HCL 4 MG/2ML IJ SOLN
4.0000 mg | Freq: Once | INTRAMUSCULAR | Status: AC
  Administered 2013-01-13: 4 mg via INTRAVENOUS
  Filled 2013-01-13: qty 2

## 2013-01-13 MED ORDER — POTASSIUM CHLORIDE 10 MEQ/100ML IV SOLN
10.0000 meq | INTRAVENOUS | Status: AC
  Administered 2013-01-13: 10 meq via INTRAVENOUS
  Filled 2013-01-13: qty 100

## 2013-01-13 MED ORDER — THIAMINE HCL 100 MG/ML IJ SOLN
100.0000 mg | Freq: Every day | INTRAMUSCULAR | Status: DC
  Administered 2013-01-17 – 2013-01-19 (×2): 100 mg via INTRAVENOUS
  Filled 2013-01-13 (×18): qty 1

## 2013-01-13 MED ORDER — SODIUM CHLORIDE 0.9 % IV BOLUS (SEPSIS)
1000.0000 mL | Freq: Once | INTRAVENOUS | Status: AC
  Administered 2013-01-13: 1000 mL via INTRAVENOUS

## 2013-01-13 MED ORDER — THIAMINE HCL 100 MG/ML IJ SOLN
100.0000 mg | Freq: Once | INTRAMUSCULAR | Status: AC
  Administered 2013-01-13: 100 mg via INTRAVENOUS
  Filled 2013-01-13: qty 2

## 2013-01-13 MED ORDER — ONDANSETRON HCL 4 MG PO TABS
4.0000 mg | ORAL_TABLET | Freq: Four times a day (QID) | ORAL | Status: DC | PRN
  Administered 2013-02-01: 4 mg via ORAL
  Filled 2013-01-13 (×2): qty 1

## 2013-01-13 MED ORDER — LORAZEPAM 2 MG/ML IJ SOLN
0.5000 mg | Freq: Once | INTRAMUSCULAR | Status: AC
  Administered 2013-01-13: 0.5 mg via INTRAVENOUS
  Filled 2013-01-13: qty 1

## 2013-01-13 MED ORDER — LORAZEPAM 2 MG/ML IJ SOLN
0.0000 mg | Freq: Four times a day (QID) | INTRAMUSCULAR | Status: DC
  Administered 2013-01-14 – 2013-01-15 (×3): 1 mg via INTRAVENOUS
  Administered 2013-01-15: 4 mg via INTRAVENOUS
  Filled 2013-01-13: qty 1
  Filled 2013-01-13: qty 2
  Filled 2013-01-13: qty 1

## 2013-01-13 MED ORDER — DEXTROSE 5 % IV SOLN
1.0000 g | Freq: Once | INTRAVENOUS | Status: AC
  Administered 2013-01-13: 1 g via INTRAVENOUS
  Filled 2013-01-13: qty 10

## 2013-01-13 MED ORDER — LORAZEPAM 2 MG/ML IJ SOLN: 0.0000 mg | Freq: Two times a day (BID) | INTRAMUSCULAR | Status: DC

## 2013-01-13 MED ORDER — MAGNESIUM SULFATE 50 % IJ SOLN: 1.0000 g | Freq: Once | INTRAMUSCULAR | Status: DC

## 2013-01-13 MED ORDER — FOLIC ACID 1 MG PO TABS
1.0000 mg | ORAL_TABLET | Freq: Every day | ORAL | Status: DC
  Administered 2013-01-14 – 2013-01-15 (×2): 1 mg via ORAL
  Filled 2013-01-13 (×2): qty 1

## 2013-01-13 MED ORDER — ONDANSETRON HCL 4 MG/2ML IJ SOLN
4.0000 mg | Freq: Four times a day (QID) | INTRAMUSCULAR | Status: DC | PRN
  Administered 2013-01-14 – 2013-01-31 (×7): 4 mg via INTRAVENOUS
  Filled 2013-01-13 (×7): qty 2

## 2013-01-13 MED ORDER — MAGNESIUM SULFATE IN D5W 10-5 MG/ML-% IV SOLN
1.0000 g | Freq: Once | INTRAVENOUS | Status: AC
  Administered 2013-01-13: 1 g via INTRAVENOUS
  Filled 2013-01-13: qty 100

## 2013-01-13 MED ORDER — MAGNESIUM SULFATE IN D5W 10-5 MG/ML-% IV SOLN
1.0000 g | Freq: Once | INTRAVENOUS | Status: DC
  Filled 2013-01-13: qty 100

## 2013-01-13 MED ORDER — LORAZEPAM 1 MG PO TABS
1.0000 mg | ORAL_TABLET | Freq: Four times a day (QID) | ORAL | Status: DC | PRN
  Administered 2013-01-14: 1 mg via ORAL
  Filled 2013-01-13 (×5): qty 1

## 2013-01-13 MED ORDER — SODIUM CHLORIDE 0.9 % IJ SOLN
3.0000 mL | Freq: Two times a day (BID) | INTRAMUSCULAR | Status: DC
  Administered 2013-01-14 – 2013-02-02 (×26): 3 mL via INTRAVENOUS

## 2013-01-13 NOTE — ED Notes (Signed)
Pt states that he has been bruising easily over the past couple of weeks (pt has LARGE bruises on his sides that he doesn't remember doing), c/o lethargy, disorientation at night, shaking, nausea, and vomiting.  The whites of pt's eyes are yellow.

## 2013-01-13 NOTE — ED Provider Notes (Signed)
CSN: [id number]     Arrival date & time 01/13/13  1837 History   First MD Initiated Contact with Patient 01/13/13 1943     Chief Complaint  Patient presents with  . Jaundice  . Bleeding/Bruising  . Shaking  . Nausea   (Consider location/radiation/quality/duration/timing/severity/associated sxs/prior Treatment) HPI CLEARENCE VITUG is a 27 y.o. male who presents to emergency department complaining of yellow discoloration to his eyes, bruising, shaking, nausea, vomiting, and weakness. Patient states that he has had the discoloration and bruising for approximately a week. States has had recent hospitalization for 16 days ago for pancreatitis related to his alcohol abuse. He states that he did stop drinking when he was discharged. He admits to having a beer 3 days ago. Patient states that he was actually seen today by his neurologist who is a Control and instrumentation engineer and was told that everything looked fine and he will need further followup regarding his jaundice. Patient states that Are also was increased when he was in the hospital to a higher dose and he has not missed any doses. Patient states that his shaking and nausea, vomiting and confusion started this afternoon. Patient denies any other drug use. Patient states he does not take any medications other than potassium and Her at home. Patient has no history of jaundice or bruising or bleeding problems   Past Medical History  Diagnosis Date  . Seizure disorder   . Seizures   . Allergy   . Anxiety    Past Surgical History  Procedure Laterality Date  . Wisdom tooth extraction     Family History  Problem Relation Age of Onset  . Heart disease      cardiovascular disease  . Cancer     History  Substance Use Topics  . Smoking status: Never Smoker   . Smokeless tobacco: Never Used  . Alcohol Use: 1.2 oz/week    2 Shots of liquor per week     Comment: social    Review of Systems  Constitutional: Negative for fever and chills.  HENT: Negative for  neck pain and neck stiffness.   Respiratory: Negative for cough, chest tightness and shortness of breath.   Cardiovascular: Negative for chest pain, palpitations and leg swelling.  Gastrointestinal: Negative for nausea, vomiting, abdominal pain, diarrhea and abdominal distention.  Genitourinary: Negative for dysuria, urgency, frequency and hematuria.  Musculoskeletal: Negative for myalgias and arthralgias.  Skin: Positive for color change and wound. Negative for rash.  Allergic/Immunologic: Negative for immunocompromised state.  Neurological: Positive for dizziness, weakness and light-headedness. Negative for numbness and headaches.  Hematological: Bruises/bleeds easily.    Allergies  Oxycodone  Home Medications   Current Outpatient Rx  Name  Route  Sig  Dispense  Refill  . ibuprofen (ADVIL,MOTRIN) 200 MG tablet   Oral   Take 400 mg by mouth every 6 (six) hours as needed for pain.         Marland Kitchen levETIRAcetam (KEPPRA) 1000 MG tablet   Oral   Take 1 tablet (1,000 mg total) by mouth every 12 (twelve) hours.   60 tablet   3   . Multiple Vitamin (MULTIVITAMIN WITH MINERALS) TABS   Oral   Take 1 tablet by mouth daily.         . potassium chloride SA (K-DUR,KLOR-CON) 20 MEQ tablet   Oral   Take 1 tablet (20 mEq total) by mouth 2 (two) times daily.   60 tablet   2    BP 127/76  Pulse 101  Temp(Src) 99.3 F (37.4 C) (Oral)  Resp 18  SpO2 93% Physical Exam  Nursing note and vitals reviewed. Constitutional: He is oriented to person, place, and time. He appears well-developed and well-nourished. No distress.  HENT:  Head: Normocephalic and atraumatic.  Mouth/Throat: Oropharynx is clear and moist. No oropharyngeal exudate.  Eyes: Conjunctivae are normal. Pupils are equal, round, and reactive to light. Scleral icterus is present.  Neck: Neck supple.  Cardiovascular: Regular rhythm and normal heart sounds.   tachycardic  Pulmonary/Chest: Effort normal. No respiratory  distress. He has no wheezes. He has no rales.  Abdominal: Soft. Bowel sounds are normal. There is no tenderness. There is no rebound.  Liver extending down to the umbilicus level  Musculoskeletal: He exhibits no edema.  Neurological: He is alert and oriented to person, place, and time.  Resting tremor in extremities  Skin: Skin is warm and dry.  Multiple contusions over entire body.    ED Course  Procedures (including critical care time) Labs Review Labs Reviewed  CBC WITH DIFFERENTIAL - Abnormal; Notable for the following:    RBC 3.04 (*)    Hemoglobin 10.9 (*)    HCT 31.0 (*)    MCV 102.0 (*)    MCH 35.9 (*)    Lymphocytes Relative 11 (*)    Monocytes Relative 16 (*)    All other components within normal limits  APTT - Abnormal; Notable for the following:    aPTT 52 (*)    All other components within normal limits  PROTIME-INR - Abnormal; Notable for the following:    Prothrombin Time 20.5 (*)    INR 1.82 (*)    All other components within normal limits  LIPASE, BLOOD  URINALYSIS, ROUTINE W REFLEX MICROSCOPIC  ETHANOL  BASIC METABOLIC PANEL  HEPATIC FUNCTION PANEL  AMMONIA   Imaging Review   MDM   1. Hepatic encephalopathy   2. Hyperbilirubinemia   3. Seizure   4. Hypokalemia   5. Hypomagnesemia   6. Hepatomegaly   7. Alcohol abuse   8. Alcohol withdrawal   9. Coagulopathy   10. Thrombocytopenia   11. Transaminitis      8:36 PM Patient had a brief grand mal seizure in emergency department. I did order him Ativan on initial presentation because of concern for possible alcohol withdrawal symptoms. Patient did not receive the medication before he had the seizure. Patient received 2 mg of Ativan IV. Patient is currently post ictal.   Pt with hepatic encephalopathy, he is tachycardic, thrombocytopenic, elevated iNR. Concerning for possible alcohol withdrawal, however, pt denies drinking alcohol. I have treated pt with IV fluids in emergency department,  potassium and magnesium supplementation. His vs continue to remain tachycardic. [facility] hospitalist was consulted for further treatment and admission.     Lottie Mussel, PA-C 01/15/13 [phone]

## 2013-01-13 NOTE — ED Notes (Signed)
Pt had witnessed seizure x2. First seizure mini seizure lasting 1-30minutes. Second seizure grand mal lasting 2 minutes. Pt put on oxygen 2L nasal cannula.

## 2013-01-13 NOTE — H&P (Signed)
[facility] History and Physical  Eric Mueller KGM:[id number] DOB: Oct 12, 1985 DOA: 01/13/2013  Referring physician: Dr. Romeo Apple PCP: Lucilla Edin, MD  Specialists: GI, Dr. Elnoria Howard  Chief Complaint: jaundice, fatigue, bruising, seizure  HPI: Eric Mueller is a 27 y.o. male has a past medical history significant for alcohol abuse, with a prior hospitalization in January of 2014 for acute pancreatitis, another hospitalization in July for alcohol withdrawal seizures, presents to the emergency room with a chief complaint of yellowing of his eyes, fatigue, easy bruising, progressively over the past 2 weeks. He was at [facility] today to see his neurologist, and was instructed to see his PCP because of scleral icterus. Patient endorses a history of heavy alcohol abuse, however he states that his last alcoholic binge was about 6 weeks ago. He continues to drink a beer every other day. He endorses having a beer last night, and a beer on Sunday while watching the football game. He denies heavy alcohol abuse currently. He lives with his parents, as far as the parents know he is not binge drinking anymore (but told me separately that he is not 100% to be trusted). He reports several falls in the last couple weeks, which he is not sure if there was seizures but might have been, and has multiple bruises on his legs, under his left armpit, on the left side of his abdomen. He endorses poor appetite. He endorses feeling feverish at home. He denies nausea or vomiting. He endorses chest pain on the right side and thinks this might be after a fall. He states that his belly feels "tight" and prevents his from breathing deeply. He denies lightheadedness or dizziness but feels overall weak. He denies having an episode like this before. In the ED, patient was noted to have a generalized tonic clonic seizure which responded to Ativan. By the time I saw patient, he was no longer post ictal. Blood work showed significant liver  damage with elevated AST and ALT, total bilirubin of 13, INR 1.8. Lactic acid was elevated however per EDP this was drawn after his seizure.   Review of Systems: as per HPI otherwise negative.   Past Medical History  Diagnosis Date  . Seizure disorder   . Seizures   . Allergy   . Anxiety    Past Surgical History  Procedure Laterality Date  . Wisdom tooth extraction     Social History:  reports that he has never smoked. He has never used smokeless tobacco. He reports that he drinks about 1.2 ounces of alcohol per week. He reports that he does not use illicit drugs.  Allergies  Allergen Reactions  . Oxycodone Nausea And Vomiting    Family History  Problem Relation Age of Onset  . Heart disease      cardiovascular disease  . Cancer      Prior to Admission medications   Medication Sig Start Date End Date Taking? Authorizing Provider  ibuprofen (ADVIL,MOTRIN) 200 MG tablet Take 400 mg by mouth every 6 (six) hours as needed for pain.   Yes Historical Provider, MD  levETIRAcetam (KEPPRA) 1000 MG tablet Take 1 tablet (1,000 mg total) by mouth every 12 (twelve) hours. 11/28/12  Yes Maryruth Bun Rama, MD  Multiple Vitamin (MULTIVITAMIN WITH MINERALS) TABS Take 1 tablet by mouth daily.   Yes Historical Provider, MD  potassium chloride SA (K-DUR,KLOR-CON) 20 MEQ tablet Take 1 tablet (20 mEq total) by mouth 2 (two) times daily. 11/28/12  Yes Christina P Rama,  MD   Physical Exam: Filed Vitals:   01/13/13 1848 01/13/13 1924 01/13/13 2140  BP: 144/88 127/76   Pulse: 130 101   Temp: 98 F (36.7 C) 99.3 F (37.4 C) 100.3 F (37.9 C)  TempSrc: Oral Oral Rectal  Resp: 18 18   SpO2: 100% 93%      General:  Very tremulous caucasian male, obviously jaundiced  Eyes: scleral icterus  ENT: moist oropharynx  Neck: supple, no JVD  Cardiovascular: regular rate without MRG; 2+ peripheral pulses  Respiratory: CTA biL, good air movement without wheezing, rhonchi or crackled  Abdomen:  hepatomegaly, positive bowel sounds, no guarding, no rebound, mild diffuse tenderness throughout. No ascites appreciated.   Skin: no rashes; multiple bruises left foot, left abdominal wall, left armpit  Musculoskeletal: no peripheral edema  Psychiatric: normal mood and affect  Neurologic: non focal. Alert to time, place and person. No asterixis.   Labs on Admission:  Basic Metabolic Panel:  Recent Labs Lab 01/13/13 1949  NA 136  K 2.6*  CL 89*  CO2 27  GLUCOSE 109*  BUN 3*  CREATININE 0.48*  CALCIUM 8.7  MG 1.3*   Liver Function Tests:  Recent Labs Lab 01/13/13 1949  AST 559*  ALT 149*  ALKPHOS 244*  BILITOT 13.6*  PROT 6.1  ALBUMIN 2.7*    Recent Labs Lab 01/13/13 1949  LIPASE 14    Recent Labs Lab 01/13/13 2015  AMMONIA 43   CBC:  Recent Labs Lab 01/13/13 1949  WBC 6.2  NEUTROABS 4.5  HGB 10.9*  HCT 31.0*  MCV 102.0*  PLT 98*   Radiological Exams on Admission: No results found.  Assessment/Plan Active Problems:   Seizure disorder   Alcohol abuse   Alcohol withdrawal   Thrombocytopenia   Transaminitis   Hypokalemia   Hypomagnesemia   Ulceration, tongue traumatic   Coagulopathy   Hepatitis    Acute on chronic liver injury  - has some degree of chronic liver disease as far as his January 2014 CT scan ("Severe diffuse hepatic steatosis"). Patient unfortunately continues to drink. I've discussed the case with gastroenterology over the phone, they will consult in the morning. Supportive treatment overnight, admit to SDU. Not confused currently. Evidence of altered synthetic function with low albumin and elevated INR. Seizure - continue home medication. Suspect that patient is continuing to drink and this was an withdrawal seizure.  ETOH abuse/withdrawal - place on CIWA with scheduled Ativan Thrombocytopenia - due to chronic liver disease Hypokalemia/hypomagnesemia - ?poor nutritional status, replete and monitor. Coagulopathy - closely  monitor.  Macrocytic anemia - due to #3, check Vitamin B12 and folate Elevated lactic acid - due to #2 likely. Hydrate and monitor.  History of pancreatitis - lipase WNL now.  Right sided chest pain - Obtain 2 view CXR. DVT Prophylaxis - SCDs.  Code Status: Full Family Communication: parents at bedside  Disposition Plan: inpatient  Time spent: 41  Costin M. Elvera Lennox, MD [facility] Pager [phone]  If 7PM-7AM, please contact night-coverage [email or web address] Password [facility] 01/13/2013, 10:36 PM

## 2013-01-14 ENCOUNTER — Inpatient Hospital Stay (HOSPITAL_COMMUNITY): Payer: BC Managed Care – PPO

## 2013-01-14 ENCOUNTER — Encounter (HOSPITAL_COMMUNITY): Payer: Self-pay

## 2013-01-14 LAB — LACTIC ACID, PLASMA: Lactic Acid, Venous: 2.8 mmol/L — ABNORMAL HIGH (ref 0.5–2.2)

## 2013-01-14 LAB — CBC
Hemoglobin: 10.3 g/dL — ABNORMAL LOW (ref 13.0–17.0)
MCH: 36.5 pg — ABNORMAL HIGH (ref 26.0–34.0)
MCHC: 35.2 g/dL (ref 30.0–36.0)
RDW: 14.5 % (ref 11.5–15.5)

## 2013-01-14 LAB — COMPREHENSIVE METABOLIC PANEL
AST: 464 U/L — ABNORMAL HIGH (ref 0–37)
BUN: <3 mg/dL — ABNORMAL LOW (ref 6–23)
CO2: 29 mEq/L (ref 19–32)
Calcium: 7.4 mg/dL — ABNORMAL LOW (ref 8.4–10.5)
Chloride: 94 mEq/L — ABNORMAL LOW (ref 96–112)
Creatinine, Ser: 0.54 mg/dL (ref 0.50–1.35)
GFR calc Af Amer: >90 mL/min (ref >90)
GFR calc non Af Amer: >90 mL/min (ref >90)
Glucose, Bld: 79 mg/dL (ref 70–99)
Total Bilirubin: 12.1 mg/dL — ABNORMAL HIGH (ref 0.3–1.2)

## 2013-01-14 LAB — RAPID URINE DRUG SCREEN, HOSP PERFORMED
Cocaine: NOT DETECTED
Opiates: NOT DETECTED

## 2013-01-14 LAB — HEPATITIS PANEL, ACUTE
HCV Ab: NEGATIVE
Hepatitis B Surface Ag: NEGATIVE

## 2013-01-14 LAB — PROTIME-INR
INR: 2.08 — ABNORMAL HIGH (ref 0.00–1.49)
Prothrombin Time: 22.7 seconds — ABNORMAL HIGH (ref 11.6–15.2)

## 2013-01-14 LAB — APTT: aPTT: 56 seconds — ABNORMAL HIGH (ref 24–37)

## 2013-01-14 MED ORDER — PHYTONADIONE 5 MG PO TABS
5.0000 mg | ORAL_TABLET | Freq: Every day | ORAL | Status: AC
  Administered 2013-01-14 – 2013-01-16 (×3): 5 mg via ORAL
  Filled 2013-01-14 (×3): qty 1

## 2013-01-14 MED ORDER — POTASSIUM CHLORIDE CRYS ER 20 MEQ PO TBCR
40.0000 meq | EXTENDED_RELEASE_TABLET | Freq: Once | ORAL | Status: AC
  Administered 2013-01-14: 40 meq via ORAL
  Filled 2013-01-14: qty 2

## 2013-01-14 MED ORDER — POTASSIUM CHLORIDE 10 MEQ/100ML IV SOLN
INTRAVENOUS | Status: AC
  Filled 2013-01-14: qty 100

## 2013-01-14 MED ORDER — ONDANSETRON HCL 4 MG/2ML IJ SOLN: 4.0000 mg | Freq: Three times a day (TID) | INTRAMUSCULAR | Status: AC | PRN

## 2013-01-14 MED ORDER — MAGNESIUM SULFATE IN D5W 10-5 MG/ML-% IV SOLN
1.0000 g | Freq: Once | INTRAVENOUS | Status: AC
  Administered 2013-01-14: 1 g via INTRAVENOUS
  Filled 2013-01-14: qty 100

## 2013-01-14 MED ORDER — LEVETIRACETAM 500 MG PO TABS
1000.0000 mg | ORAL_TABLET | Freq: Two times a day (BID) | ORAL | Status: DC
  Administered 2013-01-14 – 2013-01-15 (×4): 1000 mg via ORAL
  Filled 2013-01-14 (×5): qty 2

## 2013-01-14 MED ORDER — SODIUM CHLORIDE 0.9 % IV SOLN: INTRAVENOUS | Status: AC

## 2013-01-14 MED ORDER — POTASSIUM CHLORIDE 10 MEQ/100ML IV SOLN
10.0000 meq | INTRAVENOUS | Status: AC
  Administered 2013-01-14 (×4): 10 meq via INTRAVENOUS
  Filled 2013-01-14 (×3): qty 100

## 2013-01-14 MED ORDER — PANTOPRAZOLE SODIUM 40 MG IV SOLR
40.0000 mg | Freq: Every day | INTRAVENOUS | Status: DC
  Administered 2013-01-14 (×2): 40 mg via INTRAVENOUS
  Filled 2013-01-14 (×3): qty 40

## 2013-01-14 NOTE — Progress Notes (Signed)
On arrival to ICU, bruises noted on left mid upper arm, lower left trunk area, left ankle, top of right foot. He had bitten his tongue.

## 2013-01-14 NOTE — Progress Notes (Signed)
CARE MANAGEMENT NOTE 01/14/2013  Patient:  Eric Mueller, Eric Mueller   Account Number:  [id number]  Date Initiated:  01/14/2013  Documentation initiated by:  DAVIS,RHONDA  Subjective/Objective Assessment:   pt with hx of etoh abuse and seizure.     Action/Plan:   lives with parent.  home when stable   Anticipated DC Date:  01/17/2013   Anticipated DC Plan:  HOME/SELF CARE  In-house referral  NA      DC Planning Services  NA      [facility] Choice  NA   Choice offered to / List presented to:  NA   DME arranged  NA      DME agency  NA     HH arranged  NA      HH agency  NA   Status of service:  In process, will continue to follow Medicare Important Message given?  NA - LOS <3 / Initial given by admissions (If response is "NO", the following Medicare IM given date fields will be blank) Date Medicare IM given:   Date Additional Medicare IM given:    Discharge Disposition:    Per UR Regulation:  Reviewed for med. necessity/level of care/duration of stay  If discussed at Long Length of Stay Meetings, dates discussed:    Comments:  [id number] Stark Jock, BSN, [place] [phone] Chart Reviewed for discharge and hospital needs. patient being transferred from sdu to tele on 5 east. Discharge needs at time of review:  None Review of patient progress due on [id number].

## 2013-01-14 NOTE — Progress Notes (Signed)
[facility] PROGRESS NOTE  Eric Mueller ZOX:[id number] DOB: 25-Dec-1985 DOA: 01/13/2013 PCP: Lucilla Edin, MD  Assessment/Plan: 1.Acute on chronic liver injury  - has some degree of chronic liver disease as far as his January 2014 CT scan ("Severe diffuse hepatic steatosis"). Secondary to alcohol, Patient unfortunately continues to drink.  -he has Evidence of altered synthetic function with low albumin and elevated INR. - Await hepatitis panel and abdominal ultrasound -Await GI input/further recommendations 2.Seizure -  Suspect that patient is continuing to drink and this was an withdrawal seizure.  -continue keppra. -Seizure-free overnight, follow. 3.ETOH abuse/withdrawal  -CIWA with scheduled Ativan  -No signs of withdrawal at this time 4.Thrombocytopenia  - due to chronic liver disease, follow  5.Hypokalemia/hypomagnesemia - ?poor nutritional status/alcohol,  -Replace potassium follow and recheck -mg repleted 6.Coagulopathy -Secondary to liver disease, INR trending up -Place on vitamin K x3 days follow 7.Macrocytic anemia  - due to #3, await Vitamin B12 and folate  8.Elevated lactic acid  - due to #2 likely. Continue hydration, follow and recheck in the a.m..  9.History of pancreatitis - lipase WNL now.  10.Right sided chest pain -  CXR with no acute findings. 11. 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   Code Status: full Family Communication: none at bedside Disposition Plan: transfer to tele   Consultants:  GI  Procedures:  none  Antibiotics:  none  HPI/Subjective: Patient denies nausea vomiting and no shortness of breath. He admits to indigestion/epigastric pain  Objective: Filed Vitals:   01/14/13 0600  BP: 103/72  Pulse:   Temp:   Resp: 19    Intake/Output Summary (Last 24 hours) at 01/14/13 0835 Last data filed at 01/14/13 0700  Gross per 24 hour  Intake   1500 ml   Output    900 ml  Net    600 ml   Filed Weights   01/13/13 2330 01/14/13 0247  Weight: 68.5 kg (151 lb 0.2 oz) 69.6 kg (153 lb 7 oz)    Exam:  General: alert & oriented x 3 In NAD Cardiovascular: RRR, nl S1 s2 Respiratory: CTAB Abdomen: soft, +BS, NT, +hepatomegaly, no masses palpable Extremities: No cyanosis and no edema, no tremor Skin: Bruising on abdomen/trunk and extremities   Data Reviewed: Basic Metabolic Panel:  Recent Labs Lab 01/13/13 1949 01/14/13 0330  NA 136 134*  K 2.6* 2.7*  CL 89* 94*  CO2 27 29  GLUCOSE 109* 79  BUN 3* <3*  CREATININE 0.48* 0.54  CALCIUM 8.7 7.4*  MG 1.3* 1.8   Liver Function Tests:  Recent Labs Lab 01/13/13 1949 01/14/13 0330  AST 559* 464*  ALT 149* 127*  ALKPHOS 244* 208*  BILITOT 13.6* 12.1*  PROT 6.1 5.4*  ALBUMIN 2.7* 2.2*    Recent Labs Lab 01/13/13 1949  LIPASE 14    Recent Labs Lab 01/13/13 2015  AMMONIA 43   CBC:  Recent Labs Lab 01/13/13 1949 01/14/13 0330  WBC 6.2 7.0  NEUTROABS 4.5  --   HGB 10.9* 10.3*  HCT 31.0* 29.3*  MCV 102.0* 103.9*  PLT 98* 90*   Cardiac Enzymes: No results found for this basename: CKTOTAL, CKMB, CKMBINDEX, TROPONINI,  in the last 168 hours BNP (last 3 results) No results found for this basename: PROBNP,  in the last 8760 hours CBG: No results found for this basename: GLUCAP,  in the last 168 hours  Recent Results (from the past 240 hour(s))  MRSA PCR  SCREENING     Status: None   Collection Time    01/13/13 11:35 PM      Result Value Range Status   MRSA by PCR NEGATIVE  NEGATIVE Final   Comment:            The GeneXpert MRSA Assay (FDA     approved for NASAL specimens     only), is one component of a     comprehensive MRSA colonization     surveillance program. It is not     intended to diagnose MRSA     infection nor to guide or     monitor treatment for     MRSA infections.     Studies: X-ray Chest Pa And Lateral   01/13/2013   CLINICAL DATA:   Left chest pain  EXAM: CHEST  2 VIEW  COMPARISON:  11/27/2012  FINDINGS: The heart size and mediastinal contours are within normal limits. Both lungs are clear. The visualized skeletal structures are unremarkable.  IMPRESSION: No active cardiopulmonary disease.   Electronically Signed   By: Tiburcio Pea   On: 01/13/2013 23:09    Scheduled Meds: . sodium chloride   Intravenous STAT  . folic acid  1 mg Oral Daily  . levETIRAcetam  1,000 mg Oral BID  . LORazepam  0-4 mg Intravenous Q6H   Followed by  . [START ON 01/15/2013] LORazepam  0-4 mg Intravenous Q12H  . multivitamin with minerals  1 tablet Oral Daily  . pantoprazole (PROTONIX) IV  40 mg Intravenous QHS  . potassium chloride  10 mEq Intravenous Q1 Hr x 4  . sodium chloride  3 mL Intravenous Q12H  . thiamine  100 mg Oral Daily   Or  . thiamine  100 mg Intravenous Daily   Continuous Infusions: . sodium chloride 75 mL/hr at 01/14/13 0000    Active Problems:   Seizure disorder   Alcohol abuse   Alcohol withdrawal   Thrombocytopenia   Transaminitis   Hypokalemia   Hypomagnesemia   Ulceration, tongue traumatic   Coagulopathy   Hepatitis    Time spent: 35    Jazmin Ley C  [facility] Pager [phone]. If 7PM-7AM, please contact night-coverage at [email or web address], password [facility] 01/14/2013, 8:35 AM  LOS: 1 day

## 2013-01-14 NOTE — Consult Note (Signed)
Unassigned patient;  Reason for Consult: Alcoholic liver disease.  Referring Physician: [facility]-Dr. Suanne Marker.   Eric Mueller is an 27 y.o. male.  HPI: 27 year old white male with a longstanding history of alcohol abuse, previously hospitalized for withdrawal symptoms and alcoholic pancreatitis, was evaluated by his neurologist yesterday at [facility] follows him for a seizure disorder]. As he was jaundiced on exam he was advised to see his PCP. Patient was therefore admitted yesterday for worsening jaundice; he had a seizure in the ER for which he received Ativan. He denies having any active GI problems at this time. He has had multiple falls lately and has several bruises on him [he claims these happen when he sleep walks]. He admits to drink 2-3 beers while at a ball game with his father. His last binge as per the chart was about 6 weeks, ago.     Past Medical History  Diagnosis Date  . Seizure disorder   . Alcoholic liver disease   . Seasonal allergies   . Anxiety disorder Alcoholic pancreatitis Delirium tremens Coagulopathy Thrombocytopenia Alcoholic hepatitis    Past Surgical History  Procedure Laterality Date  . Wisdom tooth extraction     Family History  Problem Relation Age of Onset  . Heart disease      cardiovascular disease  . Cancer     Social History:  reports that he has never smoked. He has never used smokeless tobacco. He reports that he drinks about 1.2 ounces of alcohol per week. He reports that he does not use illicit drugs.  Allergies:  Allergies  Allergen Reactions  . Oxycodone Nausea And Vomiting   Medications: I have reviewed the patient's current medications.  Results for orders placed during the hospital encounter of 01/13/13 (from the past 48 hour(s))  CBC WITH DIFFERENTIAL     Status: Abnormal   Collection Time    01/13/13  7:49 PM      Result Value Range   WBC 6.2  4.0 - 10.5 K/uL   RBC 3.04 (*) 4.22 - 5.81 MIL/uL   Hemoglobin 10.9 (*) 13.0 -  17.0 g/dL   HCT 29.5 (*) 62.1 - 30.8 %   MCV 102.0 (*) 78.0 - 100.0 fL   MCH 35.9 (*) 26.0 - 34.0 pg   MCHC 35.2  30.0 - 36.0 g/dL   RDW 65.7  84.6 - 96.2 %   Platelets 98 (*) 150 - 400 K/uL   Comment: SPECIMEN CHECKED FOR CLOTS     PLATELET COUNT CONFIRMED BY SMEAR   Neutrophils Relative % 73  43 - 77 %   Neutro Abs 4.5  1.7 - 7.7 K/uL   Lymphocytes Relative 11 (*) 12 - 46 %   Lymphs Abs 0.7  0.7 - 4.0 K/uL   Monocytes Relative 16 (*) 3 - 12 %   Monocytes Absolute 1.0  0.1 - 1.0 K/uL   Eosinophils Relative 0  0 - 5 %   Eosinophils Absolute 0.0  0.0 - 0.7 K/uL   Basophils Relative 1  0 - 1 %   Basophils Absolute 0.1  0.0 - 0.1 K/uL  LIPASE, BLOOD     Status: None   Collection Time    01/13/13  7:49 PM      Result Value Range   Lipase 14  11 - 59 U/L  ETHANOL     Status: None   Collection Time    01/13/13  7:49 PM      Result Value Range  Alcohol, Ethyl (B) <11  0 - 11 mg/dL   Comment:            LOWEST DETECTABLE LIMIT FOR     SERUM ALCOHOL IS 11 mg/dL     FOR MEDICAL PURPOSES ONLY  APTT     Status: Abnormal   Collection Time    01/13/13  7:49 PM      Result Value Range   aPTT 52 (*) 24 - 37 seconds   Comment:            IF BASELINE aPTT IS ELEVATED,     SUGGEST PATIENT RISK ASSESSMENT     BE USED TO DETERMINE APPROPRIATE     ANTICOAGULANT THERAPY.  PROTIME-INR     Status: Abnormal   Collection Time    01/13/13  7:49 PM      Result Value Range   Prothrombin Time 20.5 (*) 11.6 - 15.2 seconds   INR 1.82 (*) 0.00 - 1.49  BASIC METABOLIC PANEL     Status: Abnormal   Collection Time    01/13/13  7:49 PM      Result Value Range   Sodium 136  135 - 145 mEq/L   Potassium 2.6 (*) 3.5 - 5.1 mEq/L   Comment: CRITICAL RESULT CALLED TO, READ BACK BY AND VERIFIED WITH:     NEILSON,K.RN AT 2046 01/13/13 BY WELLS,D.   Chloride 89 (*) 96 - 112 mEq/L   CO2 27  19 - 32 mEq/L   Glucose, Bld 109 (*) 70 - 99 mg/dL   BUN 3 (*) 6 - 23 mg/dL   Creatinine, Ser 4.54 (*) 0.50 - 1.35  mg/dL   Calcium 8.7  8.4 - 09.8 mg/dL   GFR calc non Af Amer >90  >90 mL/min   GFR calc Af Amer >90  >90 mL/min   Comment: (NOTE)     The eGFR has been calculated using the CKD EPI equation.     This calculation has not been validated in all clinical situations.     eGFR's persistently <90 mL/min signify possible Chronic Kidney     Disease.  HEPATIC FUNCTION PANEL     Status: Abnormal   Collection Time    01/13/13  7:49 PM      Result Value Range   Total Protein 6.1  6.0 - 8.3 g/dL   Albumin 2.7 (*) 3.5 - 5.2 g/dL   AST 119 (*) 0 - 37 U/L   ALT 149 (*) 0 - 53 U/L   Alkaline Phosphatase 244 (*) 39 - 117 U/L   Total Bilirubin 13.6 (*) 0.3 - 1.2 mg/dL   Bilirubin, Direct 9.2 (*) 0.0 - 0.3 mg/dL   Indirect Bilirubin 4.4 (*) 0.3 - 0.9 mg/dL  MAGNESIUM     Status: Abnormal   Collection Time    01/13/13  7:49 PM      Result Value Range   Magnesium 1.3 (*) 1.5 - 2.5 mg/dL  URINALYSIS, ROUTINE W REFLEX MICROSCOPIC     Status: Abnormal   Collection Time    01/13/13  7:53 PM      Result Value Range   Color, Urine ORANGE (*) YELLOW   Comment: BIOCHEMICALS MAY BE AFFECTED BY COLOR   APPearance CLOUDY (*) CLEAR   Specific Gravity, Urine 1.026  1.005 - 1.030   pH 7.5  5.0 - 8.0   Glucose, UA NEGATIVE  NEGATIVE mg/dL   Hgb urine dipstick NEGATIVE  NEGATIVE   Bilirubin Urine LARGE (*) NEGATIVE  Ketones, ur 15 (*) NEGATIVE mg/dL   Protein, ur 30 (*) NEGATIVE mg/dL   Urobilinogen, UA 1.0  0.0 - 1.0 mg/dL   Nitrite POSITIVE (*) NEGATIVE   Leukocytes, UA SMALL (*) NEGATIVE  URINE MICROSCOPIC-ADD ON     Status: Abnormal   Collection Time    01/13/13  7:53 PM      Result Value Range   Squamous Epithelial / LPF FEW (*) RARE   WBC, UA 11-20  <3 WBC/hpf   RBC / HPF 0-2  <3 RBC/hpf  URINE RAPID DRUG SCREEN (HOSP PERFORMED)     Status: None   Collection Time    01/13/13  7:53 PM      Result Value Range   Opiates NONE DETECTED  NONE DETECTED   Cocaine NONE DETECTED  NONE DETECTED    Benzodiazepines NONE DETECTED  NONE DETECTED   Amphetamines NONE DETECTED  NONE DETECTED   Tetrahydrocannabinol NONE DETECTED  NONE DETECTED   Barbiturates NONE DETECTED  NONE DETECTED   Comment:            DRUG SCREEN FOR MEDICAL PURPOSES     ONLY.  IF CONFIRMATION IS NEEDED     FOR ANY PURPOSE, NOTIFY LAB     WITHIN 5 DAYS.                LOWEST DETECTABLE LIMITS     FOR URINE DRUG SCREEN     Drug Class       Cutoff (ng/mL)     Amphetamine      1000     Barbiturate      200     Benzodiazepine   200     Tricyclics       300     Opiates          300     Cocaine          300     THC              50  AMMONIA     Status: None   Collection Time    01/13/13  8:15 PM      Result Value Range   Ammonia 43  11 - 60 umol/L   Comment: ICTERIC SPECIMEN     ICTERUS AT THIS LEVEL MAY AFFECT RESULT  CG4 I-STAT (LACTIC ACID)     Status: Abnormal   Collection Time    01/13/13  9:06 PM      Result Value Range   Lactic Acid, Venous 7.94 (*) 0.5 - 2.2 mmol/L  HEPATITIS PANEL, ACUTE     Status: None   Collection Time    01/13/13 10:47 PM      Result Value Range   Hepatitis B Surface Ag NEGATIVE  NEGATIVE   HCV Ab NEGATIVE  NEGATIVE   Hep A IgM NEGATIVE  NEGATIVE   Hep B C IgM NEGATIVE  NEGATIVE   Comment: (NOTE)     High levels of Hepatitis B Core IgM antibody are detectable     during the acute stage of Hepatitis B. This antibody is used     to differentiate current from past HBV infection.     Performed at [facility]  MRSA PCR SCREENING     Status: None   Collection Time    01/13/13 11:35 PM      Result Value Range   MRSA by PCR NEGATIVE  NEGATIVE   Comment:  The GeneXpert MRSA Assay (FDA     approved for NASAL specimens     only), is one component of a     comprehensive MRSA colonization     surveillance program. It is not     intended to diagnose MRSA     infection nor to guide or     monitor treatment for     MRSA infections.  COMPREHENSIVE METABOLIC  PANEL     Status: Abnormal   Collection Time    01/14/13  3:30 AM      Result Value Range   Sodium 134 (*) 135 - 145 mEq/L   Potassium 2.7 (*) 3.5 - 5.1 mEq/L   Comment: CRITICAL RESULT CALLED TO, READ BACK BY AND VERIFIED WITH:     MICKLEY,P RN @ 0419 ON 9.17.2014 BY MCREYNOLDS,B   Chloride 94 (*) 96 - 112 mEq/L   CO2 29  19 - 32 mEq/L   Glucose, Bld 79  70 - 99 mg/dL   BUN <3 (*) 6 - 23 mg/dL   Creatinine, Ser 8.29  0.50 - 1.35 mg/dL   Calcium 7.4 (*) 8.4 - 10.5 mg/dL   Total Protein 5.4 (*) 6.0 - 8.3 g/dL   Albumin 2.2 (*) 3.5 - 5.2 g/dL   AST 562 (*) 0 - 37 U/L   ALT 127 (*) 0 - 53 U/L   Alkaline Phosphatase 208 (*) 39 - 117 U/L   Total Bilirubin 12.1 (*) 0.3 - 1.2 mg/dL   GFR calc non Af Amer >90  >90 mL/min   GFR calc Af Amer >90  >90 mL/min   Comment: (NOTE)     The eGFR has been calculated using the CKD EPI equation.     This calculation has not been validated in all clinical situations.     eGFR's persistently <90 mL/min signify possible Chronic Kidney     Disease.  PROTIME-INR     Status: Abnormal   Collection Time    01/14/13  3:30 AM      Result Value Range   Prothrombin Time 22.7 (*) 11.6 - 15.2 seconds   INR 2.08 (*) 0.00 - 1.49  APTT     Status: Abnormal   Collection Time    01/14/13  3:30 AM      Result Value Range   aPTT 56 (*) 24 - 37 seconds   Comment:            IF BASELINE aPTT IS ELEVATED,     SUGGEST PATIENT RISK ASSESSMENT     BE USED TO DETERMINE APPROPRIATE     ANTICOAGULANT THERAPY.  CBC     Status: Abnormal   Collection Time    01/14/13  3:30 AM      Result Value Range   WBC 7.0  4.0 - 10.5 K/uL   RBC 2.82 (*) 4.22 - 5.81 MIL/uL   Hemoglobin 10.3 (*) 13.0 - 17.0 g/dL   HCT 13.0 (*) 86.5 - 78.4 %   MCV 103.9 (*) 78.0 - 100.0 fL   MCH 36.5 (*) 26.0 - 34.0 pg   MCHC 35.2  30.0 - 36.0 g/dL   RDW 69.6  29.5 - 28.4 %   Platelets 90 (*) 150 - 400 K/uL   Comment: CONSISTENT WITH PREVIOUS RESULT  LACTIC ACID, PLASMA     Status: Abnormal    Collection Time    01/14/13  3:30 AM      Result Value Range   Lactic Acid, Venous 2.8 (*)  0.5 - 2.2 mmol/L  VITAMIN B12     Status: Abnormal   Collection Time    01/14/13  3:30 AM      Result Value Range   Vitamin B-12 1823 (*) 211 - 911 pg/mL   Comment: Performed at [facility]  FOLATE RBC     Status: Abnormal   Collection Time    01/14/13  3:30 AM      Result Value Range   RBC Folate 624 (*) >=366 ng/mL   Comment: Reference range not established for pediatric patients.     Performed at [facility]  MAGNESIUM     Status: None   Collection Time    01/14/13  3:30 AM      Result Value Range   Magnesium 1.8  1.5 - 2.5 mg/dL   X-ray Chest Pa And Lateral   01/13/2013   CLINICAL DATA:  Left chest pain  EXAM: CHEST  2 VIEW  COMPARISON:  11/27/2012  FINDINGS: The heart size and mediastinal contours are within normal limits. Both lungs are clear. The visualized skeletal structures are unremarkable.  IMPRESSION: No active cardiopulmonary disease.   Electronically Signed   By: Tiburcio Pea   On: 01/13/2013 23:09   [place] Art/ven Flow Abd Pelv Doppler  01/14/2013   CLINICAL DATA:  Alcohol abuse.  Acute liver injury.  EXAM: DUPLEX ULTRASOUND OF LIVER  TECHNIQUE: Color and duplex Doppler ultrasound was performed to evaluate the hepatic in-flow and out-flow vessels.  COMPARISON:  None.  FINDINGS: Portal Vein Velocities  Main:  27.7 cm/sec  Right:  14.8 cm/sec  Left:  11.1 cm/sec  Hepatic Vein Velocities  Right:  40.5 cm/sec  Middle:  31.5 cm/sec  Left:  29.7 cm/sec  Hepatic Artery Velocity:  100.6 cm/sec  Splenic Vein Velocity:  38.5. Cm/sec  Varices: Absent.  Ascites: Absent.  Additional findings: Hepatomegaly. Gallbladder sludge is noted. Portal vein and its branches are hepatopetal and flow. Hepatic veins are hepatofugal flow. The splenic vein is patent with normal directionality of flow.  IMPRESSION: Portal and hepatic veins are patent with normal flow.   Electronically Signed   By:  Maryclare Bean M.D.   On: 01/14/2013 09:51   Review of Systems  Constitutional: Negative for fever, chills, weight loss, malaise/fatigue and diaphoresis.  HENT: Positive for nosebleeds.   Eyes: Negative.   Respiratory: Negative.   Cardiovascular: Negative.   Gastrointestinal: Negative for heartburn, nausea, vomiting, abdominal pain, diarrhea, constipation, blood in stool and melena.  Genitourinary: Negative.   Musculoskeletal: Negative.   Skin: Negative.   Neurological: Positive for weakness.  Endo/Heme/Allergies: Negative.   Psychiatric/Behavioral: Negative.    Blood pressure 100/69, pulse 98, temperature 98.7 F (37.1 C), temperature source Oral, resp. rate 20, height 5\' 11"  (1.803 m), weight 69.6 kg (153 lb 7 oz), SpO2 97.00%. Physical Exam  Constitutional: He is oriented to person, place, and time. He appears well-developed and well-nourished.  HENT:  Head: Normocephalic and atraumatic.  Eyes: Conjunctivae and EOM are normal. Pupils are equal, round, and reactive to light.  Neck: Normal range of motion. Neck supple.  Cardiovascular: Normal rate and regular rhythm.   Respiratory: Effort normal and breath sounds normal.  GI: Soft. Bowel sounds are normal. He exhibits no distension and no mass. There is no tenderness. There is no rebound and no guarding.  Musculoskeletal: Normal range of motion.  Neurological: He is alert and oriented to person, place, and time.  Asterixis present  Skin: Skin is warm  and dry.  Multiple bruises noted especially a large one on the LLQ  Psychiatric:  Patient is slightly agitated but alert and oriented    Assessment/Plan: 1) Acute alcoholic hepatitis with coagulopathy and thrombocytopenia, jaundice and DT's: on Ativan. Needs to involved in an active rehab program. Will follow. Dr. Elnoria Howard to see tomorrow.   2) Seizure disorder on Keppra. 3) Multiple falls ?seizures vs alcohol intoxication.  Maysoon Lozada 01/14/2013, 7:39 PM

## 2013-01-14 NOTE — Progress Notes (Signed)
Protonix for SUP  Dr. Kalman Shan, M.D., [facility]C.P Pulmonary and Critical Care Medicine Staff Physician [place] [facility]  Pulmonary and Critical Care Pager: [phone], If no answer or between  15:00h - 7:00h: call [phone]  01/14/2013 1:25 AM

## 2013-01-14 NOTE — Progress Notes (Signed)
Patient declines MyChart activation-not interested in program

## 2013-01-14 NOTE — Progress Notes (Addendum)
eLink Physician Progress Note and Electrolyte Replacement  Patient Name: Eric Mueller DOB: 1985/08/21 MRN: [id number]  Date of Service  01/14/2013   HPI/Events of Note    Recent Labs Lab 01/13/13 1949 01/14/13 0330  NA 136 134*  K 2.6* 2.7*  CL 89* 94*  CO2 27 29  GLUCOSE 109* 79  BUN 3* <3*  CREATININE 0.48* 0.54  CALCIUM 8.7 7.4*  MG 1.3* 1.8    Estimated Creatinine Clearance: 135.3 ml/min (by C-G formula based on Cr of 0.54).  Intake/Output     09/16 0701 - 09/17 0700   P.O. 200   I.V. (mL/kg) 450 (6.5)   Other 50   IV Piggyback 250   Total Intake(mL/kg) 950 (13.6)   Urine (mL/kg/hr) 700   Total Output 700   Net +250        - I/O DETAILED x 24h    Total I/O In: 950 [P.O.:200; I.V.:450; Other:50; IV Piggyback:250] Out: 700 [Urine:700] - I/O THIS SHIFT    ASSESSMENT Low K Low Mag  eICURN Interventions  IV kcl x 4 runs + po kcl 40 IV mag x 1 gm   ASSESSMENT: MAJOR ELECTROLYTE      Dr. Kalman Shan, M.D., [facility].C.P Pulmonary and Critical Care Medicine Staff Physician [place] [facility] [place] Pulmonary and Critical Care Pager: [phone], If no answer or between  15:00h - 7:00h: call [phone]  01/14/2013 5:14 AM

## 2013-01-14 NOTE — Progress Notes (Signed)
K.Schorr, mid level, informed of K 2.7.

## 2013-01-15 DIAGNOSIS — F10239 Alcohol dependence with withdrawal, unspecified: Secondary | ICD-10-CM

## 2013-01-15 DIAGNOSIS — R569 Unspecified convulsions: Secondary | ICD-10-CM

## 2013-01-15 DIAGNOSIS — K759 Inflammatory liver disease, unspecified: Secondary | ICD-10-CM

## 2013-01-15 LAB — COMPREHENSIVE METABOLIC PANEL
ALT: 106 U/L — ABNORMAL HIGH (ref 0–53)
Alkaline Phosphatase: 193 U/L — ABNORMAL HIGH (ref 39–117)
BUN: <3 mg/dL — ABNORMAL LOW (ref 6–23)
CO2: 26 mEq/L (ref 19–32)
GFR calc Af Amer: >90 mL/min (ref >90)
GFR calc non Af Amer: >90 mL/min (ref >90)
Glucose, Bld: 68 mg/dL — ABNORMAL LOW (ref 70–99)
Potassium: 3.2 mEq/L — ABNORMAL LOW (ref 3.5–5.1)
Sodium: 137 mEq/L (ref 135–145)
Total Bilirubin: 14.2 mg/dL — ABNORMAL HIGH (ref 0.3–1.2)
Total Protein: 5.2 g/dL — ABNORMAL LOW (ref 6.0–8.3)

## 2013-01-15 LAB — CBC
MCH: 36.2 pg — ABNORMAL HIGH (ref 26.0–34.0)
MCHC: 34.1 g/dL (ref 30.0–36.0)
MCV: 106.1 fL — ABNORMAL HIGH (ref 78.0–100.0)
Platelets: 103 10*3/uL — ABNORMAL LOW (ref 150–400)
RDW: 14.6 % (ref 11.5–15.5)

## 2013-01-15 LAB — GLUCOSE, CAPILLARY: Glucose-Capillary: 85 mg/dL (ref 70–99)

## 2013-01-15 LAB — LACTIC ACID, PLASMA: Lactic Acid, Venous: 1.3 mmol/L (ref 0.5–2.2)

## 2013-01-15 LAB — PROTIME-INR: Prothrombin Time: 23.1 seconds — ABNORMAL HIGH (ref 11.6–15.2)

## 2013-01-15 MED ORDER — LORAZEPAM 1 MG PO TABS
4.0000 mg | ORAL_TABLET | Freq: Once | ORAL | Status: AC
  Administered 2013-01-15: 4 mg via ORAL

## 2013-01-15 MED ORDER — VITAMIN B-1 100 MG PO TABS: 100.0000 mg | ORAL_TABLET | Freq: Every day | ORAL | Status: DC

## 2013-01-15 MED ORDER — DIAZEPAM 5 MG/ML IJ SOLN
10.0000 mg | Freq: Once | INTRAMUSCULAR | Status: AC
  Administered 2013-01-15: 10 mg via INTRAMUSCULAR

## 2013-01-15 MED ORDER — SODIUM CHLORIDE 0.9 % IV SOLN
1000.0000 mg | Freq: Two times a day (BID) | INTRAVENOUS | Status: DC
  Administered 2013-01-15 – 2013-01-19 (×8): 1000 mg via INTRAVENOUS
  Filled 2013-01-15 (×9): qty 10

## 2013-01-15 MED ORDER — THIAMINE HCL 100 MG/ML IJ SOLN
100.0000 mg | Freq: Every day | INTRAMUSCULAR | Status: DC
  Administered 2013-01-15 – 2013-01-16 (×2): 100 mg via INTRAVENOUS
  Filled 2013-01-15 (×3): qty 1

## 2013-01-15 MED ORDER — FOLIC ACID 5 MG/ML IJ SOLN
1.0000 mg | Freq: Every day | INTRAMUSCULAR | Status: DC
  Administered 2013-01-15 – 2013-01-19 (×5): 1 mg via INTRAVENOUS
  Filled 2013-01-15 (×18): qty 0.2

## 2013-01-15 MED ORDER — DEXMEDETOMIDINE BOLUS VIA INFUSION
1.0000 ug/kg | Freq: Once | INTRAVENOUS | Status: AC
  Administered 2013-01-15: 78.7 ug via INTRAVENOUS
  Filled 2013-01-15: qty 79

## 2013-01-15 MED ORDER — MIDAZOLAM HCL 2 MG/2ML IJ SOLN: 1.0000 mg | INTRAMUSCULAR | Status: DC | PRN

## 2013-01-15 MED ORDER — KCL IN DEXTROSE-NACL 20-5-0.45 MEQ/L-%-% IV SOLN
INTRAVENOUS | Status: DC
  Administered 2013-01-15: 17:00:00 via INTRAVENOUS
  Administered 2013-01-16: 1000 mL via INTRAVENOUS
  Filled 2013-01-15 (×8): qty 1000

## 2013-01-15 MED ORDER — VITAMIN B-1 100 MG PO TABS
100.0000 mg | ORAL_TABLET | Freq: Every day | ORAL | Status: DC
  Filled 2013-01-15 (×3): qty 1

## 2013-01-15 MED ORDER — DEXMEDETOMIDINE HCL IN NACL 400 MCG/100ML IV SOLN
0.4000 ug/kg/h | INTRAVENOUS | Status: AC
  Administered 2013-01-15 (×2): 1 ug/kg/h via INTRAVENOUS
  Administered 2013-01-15: 0.6 ug/kg/h via INTRAVENOUS
  Administered 2013-01-16: 1.2 ug/kg/h via INTRAVENOUS
  Administered 2013-01-16: 0.6 ug/kg/h via INTRAVENOUS
  Filled 2013-01-15 (×4): qty 100
  Filled 2013-01-15 (×2): qty 50

## 2013-01-15 MED ORDER — LORAZEPAM 2 MG/ML IJ SOLN: 4.0000 mg | Freq: Once | INTRAMUSCULAR | Status: AC

## 2013-01-15 MED ORDER — LORAZEPAM 2 MG/ML IJ SOLN: 2.0000 mg | Freq: Once | INTRAMUSCULAR | Status: DC

## 2013-01-15 MED ORDER — FOLIC ACID 1 MG PO TABS: 1.0000 mg | ORAL_TABLET | Freq: Every day | ORAL | Status: DC

## 2013-01-15 MED ORDER — FOLIC ACID 1 MG PO TABS
1.0000 mg | ORAL_TABLET | Freq: Every day | ORAL | Status: DC
  Administered 2013-01-20 – 2013-02-02 (×14): 1 mg via ORAL
  Filled 2013-01-15 (×19): qty 1

## 2013-01-15 MED ORDER — DIAZEPAM 5 MG/ML IJ SOLN
INTRAMUSCULAR | Status: AC
  Filled 2013-01-15: qty 2

## 2013-01-15 MED ORDER — LORAZEPAM 2 MG/ML IJ SOLN
1.0000 mg | INTRAMUSCULAR | Status: DC | PRN
  Administered 2013-01-19: 1 mg via INTRAVENOUS
  Filled 2013-01-15: qty 1

## 2013-01-15 MED ORDER — LORAZEPAM 2 MG/ML IJ SOLN: 1.0000 mg | INTRAMUSCULAR | Status: DC | PRN

## 2013-01-15 MED ORDER — METHYLPREDNISOLONE SODIUM SUCC 40 MG IJ SOLR
40.0000 mg | Freq: Every day | INTRAMUSCULAR | Status: DC
  Administered 2013-01-15 – 2013-01-16 (×2): 40 mg via INTRAVENOUS
  Filled 2013-01-15 (×3): qty 1

## 2013-01-15 MED ORDER — PANTOPRAZOLE SODIUM 40 MG IV SOLR
40.0000 mg | Freq: Two times a day (BID) | INTRAVENOUS | Status: DC
  Administered 2013-01-15 – 2013-01-19 (×8): 40 mg via INTRAVENOUS
  Filled 2013-01-15 (×9): qty 40

## 2013-01-15 MED ORDER — POTASSIUM CHLORIDE 10 MEQ/100ML IV SOLN
10.0000 meq | INTRAVENOUS | Status: AC
  Administered 2013-01-15 (×3): 10 meq via INTRAVENOUS
  Filled 2013-01-15 (×3): qty 100

## 2013-01-15 NOTE — Progress Notes (Signed)
Clinical Social Work Department CLINICAL SOCIAL WORK PSYCHIATRY SERVICE LINE ASSESSMENT 01/15/2013  Patient:  Eric Mueller  Account:  [id number]  Admit Date:  01/13/2013  Clinical Social Worker:  Unk Lightning, LCSW  Date/Time:  01/15/2013 02:00 PM Referred by:  Physician  Date referred:  01/15/2013 Reason for Referral  Psychosocial assessment   Presenting Symptoms/Problems (In the person's/family's own words):   Psych consulted for alcohol abuse   Abuse/Neglect/Trauma History (check all that apply)  Denies history   Abuse/Neglect/Trauma Comments:   Psychiatric History (check all that apply)  Denies history   Psychiatric medications:  None   Current Mental Health Hospitalizations/Previous Mental Health History:   Patient denies any MH concerns currently or in the past.   Current provider:   None   Place and Date:   N/A   Current Medications:   LORazepam, LORazepam, ondansetron (ZOFRAN) IV, ondansetron            . folic acid  1 mg Oral Daily  . levETIRAcetam  1,000 mg Oral BID  . LORazepam  0-4 mg Intravenous Q6H   Followed by     . LORazepam  0-4 mg Intravenous Q12H  . multivitamin with minerals  1 tablet Oral Daily  . pantoprazole (PROTONIX) IV  40 mg Intravenous QHS  . phytonadione  5 mg Oral Daily  . sodium chloride  3 mL Intravenous Q12H  . thiamine  100 mg Oral Daily   Or     . thiamine  100 mg Intravenous Daily   Previous Impatient Admission/Date/Reason:   None reported   Emotional Health / Current Symptoms    Suicide/Self Harm  None reported   Suicide attempt in the past:   Patient denies any SI or HI.   Other harmful behavior:   None reported   Psychotic/Dissociative Symptoms  Visual Hallucinations   Other Psychotic/Dissociative Symptoms:   Patient reported he was seeing a king roll a slipper through the room during assessment.    Attention/Behavioral Symptoms  Inattentive   Other Attention / Behavioral Symptoms:   Patient impulsive  and struggled with remaining focused throughout assessment.    Cognitive Impairment  Unable to accurately assess   Other Cognitive Impairment:   Patient hallucinating and withdrawing.    Mood and Adjustment  Unusual/bizarre    Stress, Anxiety, Trauma, Any Recent Loss/Stressor  Other - See comment   Anxiety (frequency):   Patient reports some anxiety but reports he does not like taking medication.   Phobia (specify):   N/A   Compulsive behavior (specify):   N/A   Obsessive behavior (specify):   N/A   Other:   Patient reports parents divorced when he was 27 years old and it was stressful during his childhood.   Substance Abuse/Use  Current substance use   SBIRT completed (please refer for detailed history):  N  Self-reported substance use:   Patient is unable to fully participate in assessment but was admitted to alcohol use. CSW will assess for consumption and dependence when more appropriate.   Urinary Drug Screen Completed:  Y Alcohol level:   <11    Environmental/Housing/Living Arrangement  Stable housing   Who is in the home:   Alone   Emergency contact:  Public librarian   Patient's Strengths and Goals (patient's own words):   Patient reports good relationship with father.   Clinical Social Worker's Interpretive Summary:   CSW received referral to complete psychosocial assessment. CSW reviewed chart which stated  that psych MD recommends SA treatment at [facility]. CSW met with patient at bedside. CSW introduced myself and explained role.    Patient has a sitter at bedside and remains anxious throughout assessment. Patient admits to hallucinating and is unable to remain focused on questions asked.    Patient does begin to talk about his parents getting a divorce when he was younger and the strain it caused on his childhood. Patient reports that he has a good relationship and that dad has now remarried and he has step siblings.  Patient reports he has a good relationship with dad's family and that dad lives nearby. Patient becomes tearful when discussing divorce but is unable to remain focused to answer further questions.     CSW inquired about alcohol use but patient is unable to give specific amounts and unable to discuss treatment plans at [facility]. CSW agreeable to continue to follow and to provide resources when more appropriate.    CSW will continue to follow.   Disposition:  Recommend Psych CSW continuing to support while in hospital   [place], [place] [phone]

## 2013-01-15 NOTE — ED Provider Notes (Signed)
Medical screening examination/treatment/procedure(s) were conducted as a shared visit with non-physician practitioner(s) and myself.  I personally evaluated the patient during the encounter  I interviewed and examined the patient. Lungs are CTAB. Cardiac exam wnl. Abdomen is mildly distended. Pt appears jaundice on exam. Plan on admission to hospitalist.    Junius Argyle, MD 01/15/13 [phone]

## 2013-01-15 NOTE — Progress Notes (Addendum)
Narrative note:  Oncoming to take this assignment. Introduced to pt by Retail banker. Pt confused and hallucinating. Immediately getting out of bed with sitter, Reggie, at bedside. Unable to reason with pt, unable to get him back to bed without a difficult time. Finally able to get pt in bed but he quickly gets out of bed, yelling, screaming profanities again.Assisted back to bed and waist belt applied.Pt actively hallucinating, yelling, what the "f", all these locust,  get the locust's. Ativan IVP given per order. Dr Gloris Manchester aware.  At this point 4 RN's in room and 2 CNA's in room holding pt in bed.Bilateral soft wrist restraints and bilateral soft ankle restraints applied with a call to Dr Gloris Manchester. Pt out of control yelling profanities and cursing. Transferred to ICU for further treatment and orders.

## 2013-01-15 NOTE — Consult Note (Signed)
[facility] Face-to-Face Psychiatry Consult   Reason for Consult:  Alcohol dependence with withdrawal, Dts Referring Physician:  Dr. Eustace Quail is an 27 y.o. male.  Assessment: AXIS I:  ETOH dependence AXIS II:  Deferred AXIS III:   Past Medical History  Diagnosis Date  . Seizure disorder   . Seizures   . Allergy   . Anxiety    AXIS IV:  other psychosocial or environmental problems and problems related to social environment AXIS V:  31-40 impairment in reality testing  Plan:  No evidence of imminent risk to self or others at present.   Patient does not meet criteria for psychiatric inpatient admission. Supportive therapy provided about ongoing stressors. Discussed crisis plan, support from social network, calling 911, coming to the Emergency Department, and calling Suicide Hotline. If patient agreed , recommend inpatient or outpatient rehabilitation program.  Consider social worker consult to help patient finding placement for these program.  [facility] the patient agreed.    Subjective:   Eric Mueller is a 27 y.o. male patient admitted with complaint of fall, bruises, seizure and fatigue.  HPI:  Chart reviewed and patient seen.  Patient is 27 year old man who had a long history of alcohol abuse, previously hospitalized for withdrawal symptoms an alcoholic pancreatitis.  The patient presented with fall with multiple bruises and having significant tremors or shakes.  Patient is a poor historian and having confusion on and off.  He is unable to provide much detail and most of the information was obtained from the electronic medical records.  Patient admitted long history of drinking however more significant and past few months since he moved from [place] and lived close with his mother.  Patient admitted at least 2-3 Vodka 3-4 times a week.  Patient denies any previous history of psychiatric treatment or any rehabilitation program.   Patient denies any depressive symptoms and reported drinking alcohol for pleasure.  Patient noticed to be easily startled, confused and having rambling speech.  Patient endorsed living with his mother but unable to provide more detail.  He was found to be tremulous, anxious and restless.  His liver enzymes are significantly high.  He is on Ativan schedule for detox.  He denies any hallucination or any suicidal thinking but appears to be confused and looking on the wall for no reason.  He is easily distracted and his attention and concentration is poor.  When I ask if he is seen anything he denied.    HPI Elements:   Location:  Medical floor. Quality:  Fair. Severity:  Moderate.  Past Psychiatric History: Past Medical History  Diagnosis Date  . Seizure disorder   . Seizures   . Allergy   . Anxiety     reports that he has never smoked. He has never used smokeless tobacco. He reports that he drinks about 1.2 ounces of alcohol per week. He reports that he does not use illicit drugs. Family History  Problem Relation Age of Onset  . Heart disease      cardiovascular disease  . Cancer       Living Arrangements: Parent   Abuse/Neglect [facility]) Physical Abuse: Denies Verbal Abuse: Denies Sexual Abuse: Denies Allergies:   Allergies  Allergen Reactions  . Oxycodone Nausea And Vomiting    ACT Assessment Complete:  No:   Past Psychiatric History: Patient denies any previous history of psychiatric inpatient treatment, alcohol or substance use treatment or any rehabilitation program.  He denies any history of suicidal attempt. Place of Residence:  Lives with the mother. Marital Status:  Single Employed/Unemployed:  Unknown Education:  Has been enrolled in the school Family Supports:  Lives with his mother.   Objective: Blood pressure 109/71, pulse 107, temperature 99.1 F (37.3 C), temperature source Oral, resp. rate 20, height 5\' 11"  (1.803 m), weight 148 lb 9.4 oz (67.4 kg), SpO2  100.00%.Body mass index is 20.73 kg/(m^2). Results for orders placed during the hospital encounter of 01/13/13 (from the past 72 hour(s))  CBC WITH DIFFERENTIAL     Status: Abnormal   Collection Time    01/13/13  7:49 PM      Result Value Range   WBC 6.2  4.0 - 10.5 K/uL   RBC 3.04 (*) 4.22 - 5.81 MIL/uL   Hemoglobin 10.9 (*) 13.0 - 17.0 g/dL   HCT 11.9 (*) 14.7 - 82.9 %   MCV 102.0 (*) 78.0 - 100.0 fL   MCH 35.9 (*) 26.0 - 34.0 pg   MCHC 35.2  30.0 - 36.0 g/dL   RDW 56.2  13.0 - 86.5 %   Platelets 98 (*) 150 - 400 K/uL   Comment: SPECIMEN CHECKED FOR CLOTS     PLATELET COUNT CONFIRMED BY SMEAR   Neutrophils Relative % 73  43 - 77 %   Neutro Abs 4.5  1.7 - 7.7 K/uL   Lymphocytes Relative 11 (*) 12 - 46 %   Lymphs Abs 0.7  0.7 - 4.0 K/uL   Monocytes Relative 16 (*) 3 - 12 %   Monocytes Absolute 1.0  0.1 - 1.0 K/uL   Eosinophils Relative 0  0 - 5 %   Eosinophils Absolute 0.0  0.0 - 0.7 K/uL   Basophils Relative 1  0 - 1 %   Basophils Absolute 0.1  0.0 - 0.1 K/uL  LIPASE, BLOOD     Status: None   Collection Time    01/13/13  7:49 PM      Result Value Range   Lipase 14  11 - 59 U/L  ETHANOL     Status: None   Collection Time    01/13/13  7:49 PM      Result Value Range   Alcohol, Ethyl (B) <11  0 - 11 mg/dL   Comment:            LOWEST DETECTABLE LIMIT FOR     SERUM ALCOHOL IS 11 mg/dL     FOR MEDICAL PURPOSES ONLY  APTT     Status: Abnormal   Collection Time    01/13/13  7:49 PM      Result Value Range   aPTT 52 (*) 24 - 37 seconds   Comment:            IF BASELINE aPTT IS ELEVATED,     SUGGEST PATIENT RISK ASSESSMENT     BE USED TO DETERMINE APPROPRIATE     ANTICOAGULANT THERAPY.  PROTIME-INR     Status: Abnormal   Collection Time    01/13/13  7:49 PM      Result Value Range   Prothrombin Time 20.5 (*) 11.6 - 15.2 seconds   INR 1.82 (*) 0.00 - 1.49  BASIC METABOLIC PANEL     Status: Abnormal   Collection Time    01/13/13  7:49 PM      Result Value Range    Sodium 136  135 - 145 mEq/L   Potassium 2.6 (*) 3.5 - 5.1 mEq/L  Comment: CRITICAL RESULT CALLED TO, READ BACK BY AND VERIFIED WITH:     NEILSON,K.RN AT 2046 01/13/13 BY WELLS,D.   Chloride 89 (*) 96 - 112 mEq/L   CO2 27  19 - 32 mEq/L   Glucose, Bld 109 (*) 70 - 99 mg/dL   BUN 3 (*) 6 - 23 mg/dL   Creatinine, Ser 1.61 (*) 0.50 - 1.35 mg/dL   Calcium 8.7  8.4 - 09.6 mg/dL   GFR calc non Af Amer >90  >90 mL/min   GFR calc Af Amer >90  >90 mL/min   Comment: (NOTE)     The eGFR has been calculated using the CKD EPI equation.     This calculation has not been validated in all clinical situations.     eGFR's persistently <90 mL/min signify possible Chronic Kidney     Disease.  HEPATIC FUNCTION PANEL     Status: Abnormal   Collection Time    01/13/13  7:49 PM      Result Value Range   Total Protein 6.1  6.0 - 8.3 g/dL   Albumin 2.7 (*) 3.5 - 5.2 g/dL   AST 045 (*) 0 - 37 U/L   ALT 149 (*) 0 - 53 U/L   Alkaline Phosphatase 244 (*) 39 - 117 U/L   Total Bilirubin 13.6 (*) 0.3 - 1.2 mg/dL   Bilirubin, Direct 9.2 (*) 0.0 - 0.3 mg/dL   Indirect Bilirubin 4.4 (*) 0.3 - 0.9 mg/dL  MAGNESIUM     Status: Abnormal   Collection Time    01/13/13  7:49 PM      Result Value Range   Magnesium 1.3 (*) 1.5 - 2.5 mg/dL  URINALYSIS, ROUTINE W REFLEX MICROSCOPIC     Status: Abnormal   Collection Time    01/13/13  7:53 PM      Result Value Range   Color, Urine ORANGE (*) YELLOW   Comment: BIOCHEMICALS MAY BE AFFECTED BY COLOR   APPearance CLOUDY (*) CLEAR   Specific Gravity, Urine 1.026  1.005 - 1.030   pH 7.5  5.0 - 8.0   Glucose, UA NEGATIVE  NEGATIVE mg/dL   Hgb urine dipstick NEGATIVE  NEGATIVE   Bilirubin Urine LARGE (*) NEGATIVE   Ketones, ur 15 (*) NEGATIVE mg/dL   Protein, ur 30 (*) NEGATIVE mg/dL   Urobilinogen, UA 1.0  0.0 - 1.0 mg/dL   Nitrite POSITIVE (*) NEGATIVE   Leukocytes, UA SMALL (*) NEGATIVE  URINE MICROSCOPIC-ADD ON     Status: Abnormal   Collection Time    01/13/13   7:53 PM      Result Value Range   Squamous Epithelial / LPF FEW (*) RARE   WBC, UA 11-20  <3 WBC/hpf   RBC / HPF 0-2  <3 RBC/hpf  URINE RAPID DRUG SCREEN (HOSP PERFORMED)     Status: None   Collection Time    01/13/13  7:53 PM      Result Value Range   Opiates NONE DETECTED  NONE DETECTED   Cocaine NONE DETECTED  NONE DETECTED   Benzodiazepines NONE DETECTED  NONE DETECTED   Amphetamines NONE DETECTED  NONE DETECTED   Tetrahydrocannabinol NONE DETECTED  NONE DETECTED   Barbiturates NONE DETECTED  NONE DETECTED   Comment:            DRUG SCREEN FOR MEDICAL PURPOSES     ONLY.  IF CONFIRMATION IS NEEDED     FOR ANY PURPOSE, NOTIFY LAB  WITHIN 5 DAYS.                LOWEST DETECTABLE LIMITS     FOR URINE DRUG SCREEN     Drug Class       Cutoff (ng/mL)     Amphetamine      1000     Barbiturate      200     Benzodiazepine   200     Tricyclics       300     Opiates          300     Cocaine          300     THC              50  AMMONIA     Status: None   Collection Time    01/13/13  8:15 PM      Result Value Range   Ammonia 43  11 - 60 umol/L   Comment: ICTERIC SPECIMEN     ICTERUS AT THIS LEVEL MAY AFFECT RESULT  CG4 I-STAT (LACTIC ACID)     Status: Abnormal   Collection Time    01/13/13  9:06 PM      Result Value Range   Lactic Acid, Venous 7.94 (*) 0.5 - 2.2 mmol/L  HEPATITIS PANEL, ACUTE     Status: None   Collection Time    01/13/13 10:47 PM      Result Value Range   Hepatitis B Surface Ag NEGATIVE  NEGATIVE   HCV Ab NEGATIVE  NEGATIVE   Hep A IgM NEGATIVE  NEGATIVE   Hep B C IgM NEGATIVE  NEGATIVE   Comment: (NOTE)     High levels of Hepatitis B Core IgM antibody are detectable     during the acute stage of Hepatitis B. This antibody is used     to differentiate current from past HBV infection.     Performed at [facility]  MRSA PCR SCREENING     Status: None   Collection Time    01/13/13 11:35 PM      Result Value Range   MRSA by PCR NEGATIVE   NEGATIVE   Comment:            The GeneXpert MRSA Assay (FDA     approved for NASAL specimens     only), is one component of a     comprehensive MRSA colonization     surveillance program. It is not     intended to diagnose MRSA     infection nor to guide or     monitor treatment for     MRSA infections.  COMPREHENSIVE METABOLIC PANEL     Status: Abnormal   Collection Time    01/14/13  3:30 AM      Result Value Range   Sodium 134 (*) 135 - 145 mEq/L   Potassium 2.7 (*) 3.5 - 5.1 mEq/L   Comment: CRITICAL RESULT CALLED TO, READ BACK BY AND VERIFIED WITH:     MICKLEY,P RN @ 0419 ON 9.17.2014 BY MCREYNOLDS,B   Chloride 94 (*) 96 - 112 mEq/L   CO2 29  19 - 32 mEq/L   Glucose, Bld 79  70 - 99 mg/dL   BUN <3 (*) 6 - 23 mg/dL   Creatinine, Ser 7.82  0.50 - 1.35 mg/dL   Calcium 7.4 (*) 8.4 - 10.5 mg/dL   Total Protein 5.4 (*) 6.0 - 8.3 g/dL  Albumin 2.2 (*) 3.5 - 5.2 g/dL   AST 960 (*) 0 - 37 U/L   ALT 127 (*) 0 - 53 U/L   Alkaline Phosphatase 208 (*) 39 - 117 U/L   Total Bilirubin 12.1 (*) 0.3 - 1.2 mg/dL   GFR calc non Af Amer >90  >90 mL/min   GFR calc Af Amer >90  >90 mL/min   Comment: (NOTE)     The eGFR has been calculated using the CKD EPI equation.     This calculation has not been validated in all clinical situations.     eGFR's persistently <90 mL/min signify possible Chronic Kidney     Disease.  PROTIME-INR     Status: Abnormal   Collection Time    01/14/13  3:30 AM      Result Value Range   Prothrombin Time 22.7 (*) 11.6 - 15.2 seconds   INR 2.08 (*) 0.00 - 1.49  APTT     Status: Abnormal   Collection Time    01/14/13  3:30 AM      Result Value Range   aPTT 56 (*) 24 - 37 seconds   Comment:            IF BASELINE aPTT IS ELEVATED,     SUGGEST PATIENT RISK ASSESSMENT     BE USED TO DETERMINE APPROPRIATE     ANTICOAGULANT THERAPY.  CBC     Status: Abnormal   Collection Time    01/14/13  3:30 AM      Result Value Range   WBC 7.0  4.0 - 10.5 K/uL   RBC 2.82  (*) 4.22 - 5.81 MIL/uL   Hemoglobin 10.3 (*) 13.0 - 17.0 g/dL   HCT 45.4 (*) 09.8 - 11.9 %   MCV 103.9 (*) 78.0 - 100.0 fL   MCH 36.5 (*) 26.0 - 34.0 pg   MCHC 35.2  30.0 - 36.0 g/dL   RDW 14.7  82.9 - 56.2 %   Platelets 90 (*) 150 - 400 K/uL   Comment: CONSISTENT WITH PREVIOUS RESULT  LACTIC ACID, PLASMA     Status: Abnormal   Collection Time    01/14/13  3:30 AM      Result Value Range   Lactic Acid, Venous 2.8 (*) 0.5 - 2.2 mmol/L  VITAMIN B12     Status: Abnormal   Collection Time    01/14/13  3:30 AM      Result Value Range   Vitamin B-12 1823 (*) 211 - 911 pg/mL   Comment: Performed at [facility]  FOLATE RBC     Status: Abnormal   Collection Time    01/14/13  3:30 AM      Result Value Range   RBC Folate 624 (*) >=366 ng/mL   Comment: Reference range not established for pediatric patients.     Performed at [facility]  MAGNESIUM     Status: None   Collection Time    01/14/13  3:30 AM      Result Value Range   Magnesium 1.8  1.5 - 2.5 mg/dL  COMPREHENSIVE METABOLIC PANEL     Status: Abnormal   Collection Time    01/15/13  4:39 AM      Result Value Range   Sodium 137  135 - 145 mEq/L   Potassium 3.2 (*) 3.5 - 5.1 mEq/L   Chloride 100  96 - 112 mEq/L   CO2 26  19 - 32 mEq/L   Glucose, Bld  68 (*) 70 - 99 mg/dL   BUN <3 (*) 6 - 23 mg/dL   Comment: REPEATED TO VERIFY   Creatinine, Ser 0.52  0.50 - 1.35 mg/dL   Comment: ICTERUS AT THIS LEVEL MAY AFFECT RESULT   Calcium 7.9 (*) 8.4 - 10.5 mg/dL   Total Protein 5.2 (*) 6.0 - 8.3 g/dL   Albumin 2.2 (*) 3.5 - 5.2 g/dL   AST 130 (*) 0 - 37 U/L   ALT 106 (*) 0 - 53 U/L   Alkaline Phosphatase 193 (*) 39 - 117 U/L   Total Bilirubin 14.2 (*) 0.3 - 1.2 mg/dL   GFR calc non Af Amer >90  >90 mL/min   GFR calc Af Amer >90  >90 mL/min   Comment: (NOTE)     The eGFR has been calculated using the CKD EPI equation.     This calculation has not been validated in all clinical situations.     eGFR's persistently  <90 mL/min signify possible Chronic Kidney     Disease.  PROTIME-INR     Status: Abnormal   Collection Time    01/15/13  4:39 AM      Result Value Range   Prothrombin Time 23.1 (*) 11.6 - 15.2 seconds   INR 2.12 (*) 0.00 - 1.49  CBC     Status: Abnormal   Collection Time    01/15/13  4:39 AM      Result Value Range   WBC 8.2  4.0 - 10.5 K/uL   RBC 2.93 (*) 4.22 - 5.81 MIL/uL   Hemoglobin 10.6 (*) 13.0 - 17.0 g/dL   HCT 86.5 (*) 78.4 - 69.6 %   MCV 106.1 (*) 78.0 - 100.0 fL   MCH 36.2 (*) 26.0 - 34.0 pg   MCHC 34.1  30.0 - 36.0 g/dL   RDW 29.5  28.4 - 13.2 %   Platelets 103 (*) 150 - 400 K/uL   Comment: CONSISTENT WITH PREVIOUS RESULT  LACTIC ACID, PLASMA     Status: None   Collection Time    01/15/13  4:39 AM      Result Value Range   Lactic Acid, Venous 1.3  0.5 - 2.2 mmol/L  GLUCOSE, CAPILLARY     Status: Abnormal   Collection Time    01/15/13  6:03 AM      Result Value Range   Glucose-Capillary 64 (*) 70 - 99 mg/dL  GLUCOSE, CAPILLARY     Status: Abnormal   Collection Time    01/15/13  6:40 AM      Result Value Range   Glucose-Capillary 69 (*) 70 - 99 mg/dL   Labs are reviewed and are pertinent for  high liver enzymes and increased bilirubin , thrombocytopenia .  Current Facility-Administered Medications  Medication Dose Route Frequency Provider Last Rate Last Dose  . 0.9 %  sodium chloride infusion   Intravenous Continuous Pamella Pert, MD 75 mL/hr at 01/15/13 1200    . folic acid (FOLVITE) tablet 1 mg  1 mg Oral Daily Pamella Pert, MD   1 mg at 01/15/13 1000  . levETIRAcetam (KEPPRA) tablet 1,000 mg  1,000 mg Oral BID Pamella Pert, MD   1,000 mg at 01/15/13 1000  . LORazepam (ATIVAN) injection 0-4 mg  0-4 mg Intravenous Q6H Pamella Pert, MD   1 mg at 01/15/13 0431   Followed by  . LORazepam (ATIVAN) injection 0-4 mg  0-4 mg Intravenous Q12H Pamella Pert, MD      . LORazepam (  ATIVAN) tablet 1 mg  1 mg Oral Q6H PRN Pamella Pert, MD   1 mg at 01/14/13  0320   Or  . LORazepam (ATIVAN) injection 1 mg  1 mg Intravenous Q6H PRN Pamella Pert, MD      . multivitamin with minerals tablet 1 tablet  1 tablet Oral Daily Pamella Pert, MD   1 tablet at 01/15/13 1000  . ondansetron (ZOFRAN) tablet 4 mg  4 mg Oral Q6H PRN Pamella Pert, MD       Or  . ondansetron (ZOFRAN) injection 4 mg  4 mg Intravenous Q6H PRN Pamella Pert, MD   4 mg at 01/14/13 0007  . pantoprazole (PROTONIX) injection 40 mg  40 mg Intravenous QHS Kalman Shan, MD   40 mg at 01/14/13 2257  . phytonadione (VITAMIN K) tablet 5 mg  5 mg Oral Daily Adeline C Viyuoh, MD   5 mg at 01/15/13 1000  . sodium chloride 0.9 % injection 3 mL  3 mL Intravenous Q12H Pamella Pert, MD   3 mL at 01/14/13 0110  . thiamine (VITAMIN B-1) tablet 100 mg  100 mg Oral Daily Pamella Pert, MD   100 mg at 01/15/13 1000   Or  . thiamine (B-1) injection 100 mg  100 mg Intravenous Daily Pamella Pert, MD        Psychiatric Specialty Exam:     Blood pressure 109/71, pulse 107, temperature 99.1 F (37.3 C), temperature source Oral, resp. rate 20, height 5\' 11"  (1.803 m), weight 148 lb 9.4 oz (67.4 kg), SpO2 100.00%.Body mass index is 20.73 kg/(m^2).  General Appearance: Disheveled and Guarded  Eye Contact::  Poor  Speech:  Rambling and fast  Volume:  Increased  Mood:  Anxious  Affect:  Inappropriate  Thought Process:  Circumstantial and Disorganized  Orientation:  Other:  Alert and oriented x2  Thought Content:  Rumination and Denies any auditory or visual hallucination but appears to be responding to internal stimuli as seen things on the wall.  Suicidal Thoughts:  No  Homicidal Thoughts:  No  Memory:  Poor  Judgement:  Fair  Insight:  Fair  Psychomotor Activity:  Increased  Concentration:  Fair  Recall:  Poor  Akathisia:  No  Handed:  Right  AIMS (if indicated):     Assets:  Housing Social Support  Sleep:      Treatment Plan Summary: Continue current Ativan and detox protocol.   Patient does not need inpatient psychiatric treatment however strongly recommended inpatient rehabilitation or outpatient rehabilitation program patient agreed to get some help.  Please contact social worker for these referrals if patient agreed to get treatment for his alcohol abuse.  Call [phone] if you have further questions  Laycee Fitzsimmons T. 01/15/2013 1:19 PM

## 2013-01-15 NOTE — Progress Notes (Signed)
Subjective: See Below.  Objective: Vital signs in last 24 hours: Temp:  [99.1 F (37.3 C)-99.5 F (37.5 C)] 99.1 F (37.3 C) (09/18 0339) Pulse Rate:  [94-112] 107 (09/18 1100) Resp:  [20-28] 20 (09/18 0339) BP: (100-125)/(66-89) 109/71 mmHg (09/18 1100) SpO2:  [96 %-100 %] 100 % (09/18 0339) Weight:  [148 lb 9.4 oz (67.4 kg)] 148 lb 9.4 oz (67.4 kg) (09/18 0348) Last BM Date: 01/14/13  Intake/Output from previous day: 09/17 0701 - 09/18 0700 In: 2100 [I.V.:1900; IV Piggyback:200] Out: 975 [Urine:975] Intake/Output this shift: Total I/O In: 120 [P.O.:120] Out: 600 [Urine:600]  General appearance: Screaming "I will kill all of you... F--- YOU, F--- You  Lab Results:  Recent Labs  01/13/13 1949 01/14/13 0330 01/15/13 0439  WBC 6.2 7.0 8.2  HGB 10.9* 10.3* 10.6*  HCT 31.0* 29.3* 31.1*  PLT 98* 90* 103*   BMET  Recent Labs  01/13/13 1949 01/14/13 0330 01/15/13 0439  NA 136 134* 137  K 2.6* 2.7* 3.2*  CL 89* 94* 100  CO2 27 29 26   GLUCOSE 109* 79 68*  BUN 3* <3* <3*  CREATININE 0.48* 0.54 0.52  CALCIUM 8.7 7.4* 7.9*   LFT  Recent Labs  01/13/13 1949  01/15/13 0439  PROT 6.1  < > 5.2*  ALBUMIN 2.7*  < > 2.2*  AST 559*  < > 360*  ALT 149*  < > 106*  ALKPHOS 244*  < > 193*  BILITOT 13.6*  < > 14.2*  BILIDIR 9.2*  --   --   IBILI 4.4*  --   --   < > = values in this interval not displayed. PT/INR  Recent Labs  01/14/13 0330 01/15/13 0439  LABPROT 22.7* 23.1*  INR 2.08* 2.12*   Hepatitis Panel  Recent Labs  01/13/13 2247  HEPBSAG NEGATIVE  HCVAB NEGATIVE  HEPAIGM NEGATIVE  HEPBIGM NEGATIVE   C-Diff No results found for this basename: CDIFFTOX,  in the last 72 hours Fecal Lactopherrin No results found for this basename: FECLLACTOFRN,  in the last 72 hours  Studies/Results: X-ray Chest Pa And Lateral   01/13/2013   CLINICAL DATA:  Left chest pain  EXAM: CHEST  2 VIEW  COMPARISON:  11/27/2012  FINDINGS: The heart size and mediastinal  contours are within normal limits. Both lungs are clear. The visualized skeletal structures are unremarkable.  IMPRESSION: No active cardiopulmonary disease.   Electronically Signed   By: Tiburcio Pea   On: 01/13/2013 23:09   [place] Art/ven Flow Abd Pelv Doppler  01/14/2013   CLINICAL DATA:  Alcohol abuse.  Acute liver injury.  EXAM: DUPLEX ULTRASOUND OF LIVER  TECHNIQUE: Color and duplex Doppler ultrasound was performed to evaluate the hepatic in-flow and out-flow vessels.  COMPARISON:  None.  FINDINGS: Portal Vein Velocities  Main:  27.7 cm/sec  Right:  14.8 cm/sec  Left:  11.1 cm/sec  Hepatic Vein Velocities  Right:  40.5 cm/sec  Middle:  31.5 cm/sec  Left:  29.7 cm/sec  Hepatic Artery Velocity:  100.6 cm/sec  Splenic Vein Velocity:  38.5. Cm/sec  Varices: Absent.  Ascites: Absent.  Additional findings: Hepatomegaly. Gallbladder sludge is noted. Portal vein and its branches are hepatopetal and flow. Hepatic veins are hepatofugal flow. The splenic vein is patent with normal directionality of flow.  IMPRESSION: Portal and hepatic veins are patent with normal flow.   Electronically Signed   By: Maryclare Bean M.D.   On: 01/14/2013 09:51    Medications:  Scheduled: .  folic acid  1 mg Oral Daily  . levETIRAcetam  1,000 mg Oral BID  . LORazepam  0-4 mg Intravenous Q6H   Followed by  . LORazepam  0-4 mg Intravenous Q12H  . multivitamin with minerals  1 tablet Oral Daily  . pantoprazole (PROTONIX) IV  40 mg Intravenous QHS  . phytonadione  5 mg Oral Daily  . sodium chloride  3 mL Intravenous Q12H  . thiamine  100 mg Oral Daily   Or  . thiamine  100 mg Intravenous Daily   Continuous: . sodium chloride 75 mL/hr at 01/15/13 1200    Assessment/Plan: 1) Alcoholic hepatitis. 2) Severe ETOH withdrawal.   I reviewed his liver numbers from July.  It is rare that ETOH hepatitis causes an elevation of the transaminase higher than 500, but it appears that this is the case.  His current numbers are more  consistent with ETOH hepatitis.  His current Discriminant Index is 65 and he will benefit from steroids, however, this can worsen his withdrawal.  Plan: 1) Start Solumedrol 40 mg QD. 2) Monitor for worsening of his mental status. 3) Continue with supportive care. 4) Agree with transfer to a higher level of care.   LOS: 2 days   Suni Jarnagin D 01/15/2013, 2:47 PM

## 2013-01-15 NOTE — Progress Notes (Signed)
Patient has been educated numerous times on the call light system as well as the bed alarm protocol with teach back.  He continuously gets up without calling and at 2:15 his bed alarm was alarming again and he pulled his IV out while getting out of the bed.  Call light is within patient's reach as well as his phone and urinal. Will continue to monitor patient and round every hour.  Ernesta Amble, RN

## 2013-01-15 NOTE — Progress Notes (Signed)
[facility] PROGRESS NOTE  Eric Mueller ZOX:[id number] DOB: Jan 08, 1986 DOA: 01/13/2013 PCP: Lucilla Edin, MD  Assessment/Plan: 1.ETOH abuse/Severe DTs  -on CIWA protocol with scheduled Ativan and has received when necessary doses but not calming down-Patient extremely agitated/hallucinating/out of control -Will transfer to ICU, I have consulted CCM as patient will possibly require Precedex drip, Dr Craige Cotta to see.  2.Acute on chronic liver injury/alcoholic hepatitis - has some degree of chronic liver disease as far as his January 2014 CT scan ("Severe diffuse hepatic steatosis"). Secondary to alcohol, Patient unfortunately continues to drink.  -he has Evidence of altered synthetic function with low albumin and elevated INR-T. bili still trending up although other LFTs trending down- follow and recheck. -Appreciate GI assistance, discriminant factors 65 and patient started on steroids -Doppler ultrasound of liver show portal and hepatic veins patent and with normal flow 3.Seizure -  Suspect that patient is continuing to drink and this was an withdrawal seizure.  -continue keppra. -remains Seizure free 4.Thrombocytopenia  - due to chronic liver disease, follow  5.Hypokalemia/hypomagnesemia - ?poor nutritional status/alcohol,  -Replace potassium follow and recheck -mg repleted 6.Coagulopathy -Secondary to liver disease - on vitamin K x3 days follow recheck INR 7.Macrocytic anemia  - due to #3, await Vitamin B12 and folate  8.Elevated lactic acid  - due to #2 likely. Lactic acid level normalized with hydration, 1.3 today. 9.History of pancreatitis - lipase WNL now.  10.Right sided chest pain -  CXR with no acute findings. 11. 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   Code Status: full Family Communication: none at bedside Disposition Plan: transfer to  icu   Consultants:  GI  Procedures:  none  Antibiotics:  none  HPI/Subjective: Patient alert and oriented x3 earlier this am but very confused, and shortly thereafter started yelling, hallucinating and belligerent   Objective: Filed Vitals:   01/15/13 1100  BP: 109/71  Pulse: 107  Temp:   Resp:     Intake/Output Summary (Last 24 hours) at 01/15/13 1515 Last data filed at 01/15/13 1316  Gross per 24 hour  Intake   1095 ml  Output   1025 ml  Net     70 ml   Filed Weights   01/13/13 2330 01/14/13 0247 01/15/13 0348  Weight: 68.5 kg (151 lb 0.2 oz) 69.6 kg (153 lb 7 oz) 67.4 kg (148 lb 9.4 oz)    Exam:  General: alert & oriented x 3 In NAD Cardiovascular: RRR, nl S1 s2 Respiratory: CTAB Abdomen: soft, +BS, NT, +hepatomegaly, no masses palpable Extremities: No cyanosis and no edema, no tremor Skin: Bruising on abdomen/trunk and extremities   Data Reviewed: Basic Metabolic Panel:  Recent Labs Lab 01/13/13 1949 01/14/13 0330 01/15/13 0439  NA 136 134* 137  K 2.6* 2.7* 3.2*  CL 89* 94* 100  CO2 27 29 26   GLUCOSE 109* 79 68*  BUN 3* <3* <3*  CREATININE 0.48* 0.54 0.52  CALCIUM 8.7 7.4* 7.9*  MG 1.3* 1.8  --    Liver Function Tests:  Recent Labs Lab 01/13/13 1949 01/14/13 0330 01/15/13 0439  AST 559* 464* 360*  ALT 149* 127* 106*  ALKPHOS 244* 208* 193*  BILITOT 13.6* 12.1* 14.2*  PROT 6.1 5.4* 5.2*  ALBUMIN 2.7* 2.2* 2.2*    Recent Labs Lab 01/13/13 1949  LIPASE 14    Recent Labs Lab 01/13/13 2015  AMMONIA 43   CBC:  Recent Labs Lab  01/13/13 1949 01/14/13 0330 01/15/13 0439  WBC 6.2 7.0 8.2  NEUTROABS 4.5  --   --   HGB 10.9* 10.3* 10.6*  HCT 31.0* 29.3* 31.1*  MCV 102.0* 103.9* 106.1*  PLT 98* 90* 103*   Cardiac Enzymes: No results found for this basename: CKTOTAL, CKMB, CKMBINDEX, TROPONINI,  in the last 168 hours BNP (last 3 results) No results found for this basename: PROBNP,  in the last 8760  hours CBG:  Recent Labs Lab 01/15/13 0603 01/15/13 0640  GLUCAP 64* 69*    Recent Results (from the past 240 hour(s))  MRSA PCR SCREENING     Status: None   Collection Time    01/13/13 11:35 PM      Result Value Range Status   MRSA by PCR NEGATIVE  NEGATIVE Final   Comment:            The GeneXpert MRSA Assay (FDA     approved for NASAL specimens     only), is one component of a     comprehensive MRSA colonization     surveillance program. It is not     intended to diagnose MRSA     infection nor to guide or     monitor treatment for     MRSA infections.     Studies: X-ray Chest Pa And Lateral   01/13/2013   CLINICAL DATA:  Left chest pain  EXAM: CHEST  2 VIEW  COMPARISON:  11/27/2012  FINDINGS: The heart size and mediastinal contours are within normal limits. Both lungs are clear. The visualized skeletal structures are unremarkable.  IMPRESSION: No active cardiopulmonary disease.   Electronically Signed   By: Tiburcio Pea   On: 01/13/2013 23:09   [place] Art/ven Flow Abd Pelv Doppler  01/14/2013   CLINICAL DATA:  Alcohol abuse.  Acute liver injury.  EXAM: DUPLEX ULTRASOUND OF LIVER  TECHNIQUE: Color and duplex Doppler ultrasound was performed to evaluate the hepatic in-flow and out-flow vessels.  COMPARISON:  None.  FINDINGS: Portal Vein Velocities  Main:  27.7 cm/sec  Right:  14.8 cm/sec  Left:  11.1 cm/sec  Hepatic Vein Velocities  Right:  40.5 cm/sec  Middle:  31.5 cm/sec  Left:  29.7 cm/sec  Hepatic Artery Velocity:  100.6 cm/sec  Splenic Vein Velocity:  38.5. Cm/sec  Varices: Absent.  Ascites: Absent.  Additional findings: Hepatomegaly. Gallbladder sludge is noted. Portal vein and its branches are hepatopetal and flow. Hepatic veins are hepatofugal flow. The splenic vein is patent with normal directionality of flow.  IMPRESSION: Portal and hepatic veins are patent with normal flow.   Electronically Signed   By: Maryclare Bean M.D.   On: 01/14/2013 09:51    Scheduled Meds: . folic  acid  1 mg Oral Daily  . levETIRAcetam  1,000 mg Oral BID  . LORazepam  0-4 mg Intravenous Q6H   Followed by  . LORazepam  0-4 mg Intravenous Q12H  . methylPREDNISolone (SOLU-MEDROL) injection  40 mg Intravenous Daily  . multivitamin with minerals  1 tablet Oral Daily  . pantoprazole (PROTONIX) IV  40 mg Intravenous QHS  . phytonadione  5 mg Oral Daily  . sodium chloride  3 mL Intravenous Q12H  . thiamine  100 mg Oral Daily   Or  . thiamine  100 mg Intravenous Daily   Continuous Infusions: . sodium chloride 75 mL/hr at 01/15/13 1200    Active Problems:   Seizure disorder   Alcohol abuse   Alcohol withdrawal  Thrombocytopenia   Transaminitis   Hypokalemia   Hypomagnesemia   Ulceration, tongue traumatic   Coagulopathy   Hepatitis    Time spent: 35    [facility]  [facility] Pager [phone]. If 7PM-7AM, please contact night-coverage at [email or web address], password [facility] 01/15/2013, 3:15 PM  LOS: 2 days

## 2013-01-15 NOTE — Consult Note (Signed)
PULMONARY  / CRITICAL CARE MEDICINE  Name: Eric Mueller MRN: [id number] DOB: [birth year]    ADMISSION DATE:  01/13/2013 CONSULTATION DATE:  9/18  REFERRING MD :  Suanne Marker PRIMARY SERVICE:  [facility]  CHIEF COMPLAINT:  DTs/acute encephalopathy   BRIEF PATIENT DESCRIPTION:  27 yom admitted 9/16 w/ acute alcoholic hepatitis. PCCM asked to see on 9/19 for progressive acute encephalopathy/ DTs.   SIGNIFICANT EVENTS / STUDIES:  [place] abd/pelvis 9/16: Portal and hepatic veins are patent with normal flow.   LINES / TUBES:   CULTURES: MRSA PCR 9/16: neg   ANTIBIOTICS:   HISTORY OF PRESENT ILLNESS:   27 y.o. male has a past medical history significant for alcohol abuse, with a prior hospitalization in January of 2014 for acute pancreatitis, another hospitalization in July for alcohol withdrawal seizures, presents to the emergency room 9/16 with a chief complaint of yellowing of his eyes, fatigue, easy bruising, progressively over 2 weeks. Was admitted for acute alcoholic hepatitis w/ coagulopathy and thrombocytopenia. Treatment has included GI consultation, steroids and CIWA protocol. During the am hours of 9/18 began to develop evidence of ETOH w/d.  PCCM asked to see for progressive agitation.   PAST MEDICAL HISTORY :  Past Medical History  Diagnosis Date  . Seizure disorder   . Seizures   . Allergy   . Anxiety    Past Surgical History  Procedure Laterality Date  . Wisdom tooth extraction     Prior to Admission medications   Medication Sig Start Date End Date Taking? Authorizing Provider  ibuprofen (ADVIL,MOTRIN) 200 MG tablet Take 400 mg by mouth every 6 (six) hours as needed for pain.   Yes Historical Provider, MD  levETIRAcetam (KEPPRA) 1000 MG tablet Take 1 tablet (1,000 mg total) by mouth every 12 (twelve) hours. 11/28/12  Yes Maryruth Bun Rama, MD  Multiple Vitamin (MULTIVITAMIN WITH MINERALS) TABS Take 1 tablet by mouth daily.   Yes Historical Provider, MD  potassium chloride SA  (K-DUR,KLOR-CON) 20 MEQ tablet Take 1 tablet (20 mEq total) by mouth 2 (two) times daily. 11/28/12  Yes Maryruth Bun Rama, MD   Allergies  Allergen Reactions  . Oxycodone Nausea And Vomiting    FAMILY HISTORY:  Family History  Problem Relation Age of Onset  . Heart disease      cardiovascular disease  . Cancer     SOCIAL HISTORY:  reports that he has never smoked. He has never used smokeless tobacco. He reports that he drinks about 1.2 ounces of alcohol per week. He reports that he does not use illicit drugs.  REVIEW OF SYSTEMS:   Unable   SUBJECTIVE:  Agitated  VITAL SIGNS: Temp:  [99.1 F (37.3 C)-99.5 F (37.5 C)] 99.1 F (37.3 C) (09/18 0339) Pulse Rate:  [94-112] 107 (09/18 1100) Resp:  [20-28] 20 (09/18 0339) BP: (100-125)/(66-89) 109/71 mmHg (09/18 1100) SpO2:  [96 %-100 %] 100 % (09/18 0339) Weight:  [67.4 kg (148 lb 9.4 oz)] 67.4 kg (148 lb 9.4 oz) (09/18 0348) Room air  HEMODYNAMICS:   VENTILATOR SETTINGS:   INTAKE / OUTPUT: Intake/Output     09/17 0701 - 09/18 0700 09/18 0701 - 09/19 0700   P.O.  120   I.V. (mL/kg) 1900 (28.2)    Other     IV Piggyback 200    Total Intake(mL/kg) 2100 (31.2) 120 (1.8)   Urine (mL/kg/hr) 975 (0.6) 600 (1.1)   Total Output 975 600   Net +1125 -480  Urine Occurrence 4 x    Stool Occurrence 2 x      PHYSICAL EXAMINATION: General:  Agitated restless and confused 27 year old male  Neuro:  Agitated, confused and combative,strength equal  HEENT:  Cardiovascular:  RRR Lungs:  Clear  Abdomen:  Firm, hypoactive + bowel sounds  Musculoskeletal:  intact Skin:  abrasion left knee   LABS:  CBC Recent Labs     01/13/13  1949  01/14/13  0330  01/15/13  0439  WBC  6.2  7.0  8.2  HGB  10.9*  10.3*  10.6*  HCT  31.0*  29.3*  31.1*  PLT  98*  90*  103*   Coag's Recent Labs     01/13/13  1949  01/14/13  0330  01/15/13  0439  APTT  52*  56*   --   INR  1.82*  2.08*  2.12*   BMET Recent Labs     01/13/13   1949  01/14/13  0330  01/15/13  0439  NA  136  134*  137  K  2.6*  2.7*  3.2*  CL  89*  94*  100  CO2  27  29  26   BUN  3*  <3*  <3*  CREATININE  0.48*  0.54  0.52  GLUCOSE  109*  79  68*   Electrolytes Recent Labs     01/13/13  1949  01/14/13  0330  01/15/13  0439  CALCIUM  8.7  7.4*  7.9*  MG  1.3*  1.8   --    Sepsis Markers No results found for this basename: LACTICACIDVEN, PROCALCITON, O2SATVEN,  in the last 72 hours ABG No results found for this basename: PHART, PCO2ART, PO2ART,  in the last 72 hours Liver Enzymes Recent Labs     01/13/13  1949  01/14/13  0330  01/15/13  0439  AST  559*  464*  360*  ALT  149*  127*  106*  ALKPHOS  244*  208*  193*  BILITOT  13.6*  12.1*  14.2*  ALBUMIN  2.7*  2.2*  2.2*   Cardiac Enzymes No results found for this basename: TROPONINI, PROBNP,  in the last 72 hours Glucose Recent Labs     01/15/13  0603  01/15/13  0640  GLUCAP  64*  69*    Imaging X-ray Chest Pa And Lateral   01/13/2013   CLINICAL DATA:  Left chest pain  EXAM: CHEST  2 VIEW  COMPARISON:  11/27/2012  FINDINGS: The heart size and mediastinal contours are within normal limits. Both lungs are clear. The visualized skeletal structures are unremarkable.  IMPRESSION: No active cardiopulmonary disease.   Electronically Signed   By: Tiburcio Pea   On: 01/13/2013 23:09   [place] Art/ven Flow Abd Pelv Doppler  01/14/2013   CLINICAL DATA:  Alcohol abuse.  Acute liver injury.  EXAM: DUPLEX ULTRASOUND OF LIVER  TECHNIQUE: Color and duplex Doppler ultrasound was performed to evaluate the hepatic in-flow and out-flow vessels.  COMPARISON:  None.  FINDINGS: Portal Vein Velocities  Main:  27.7 cm/sec  Right:  14.8 cm/sec  Left:  11.1 cm/sec  Hepatic Vein Velocities  Right:  40.5 cm/sec  Middle:  31.5 cm/sec  Left:  29.7 cm/sec  Hepatic Artery Velocity:  100.6 cm/sec  Splenic Vein Velocity:  38.5. Cm/sec  Varices: Absent.  Ascites: Absent.  Additional findings: Hepatomegaly.  Gallbladder sludge is noted. Portal vein and its branches are hepatopetal and flow. Hepatic  veins are hepatofugal flow. The splenic vein is patent with normal directionality of flow.  IMPRESSION: Portal and hepatic veins are patent with normal flow.   Electronically Signed   By: Maryclare Bean M.D.   On: 01/14/2013 09:51     CXR:   ASSESSMENT / PLAN:  PULMONARY A: at risk for respiratory failure  P:   Wean FIO2   CARDIOVASCULAR A: tachycardia  P:  Tele ivfs   RENAL A:   No acute issue  P:   Keep volume status even Avoid insensible loss F/u chem   GASTROINTESTINAL A:   Acute alcoholic hepatitis  GI following.  P:   Steroids per GI F/u chem   HEMATOLOGIC A:  Mild Macrocytic anemia  Thrombocytopenia Hepatitis related coagulopathy  P:  Vit K Transfuse for hgb <7 F/u cbc   INFECTIOUS A:  No acute infection  P:   Trend cbc and fever curve   ENDOCRINE A:   Episodes of hypoglycemia P:   Trend cbc  NEUROLOGIC A:   Acute encephalopathy Delirium tremens  P:   Supportive care   TODAY'S SUMMARY:  Worsening DTs. Needs precedex and ICU level care.   I have personally obtained a history, examined the patient, evaluated laboratory and imaging results, formulated the assessment and plan and placed orders. CRITICAL CARE: The patient is critically ill with multiple organ systems failure and requires high complexity decision making for assessment and support, frequent evaluation and titration of therapies, application of advanced monitoring technologies and extensive interpretation of multiple databases. Critical Care Time devoted to patient care services described in this note is 40 minutes.    Pulmonary and Critical Care Medicine [facility] Pager: [phone]  01/15/2013, 3:08 PM  Reviewed above, examined pt.  27 yo with hx of ETOH complicated by hepatitis, and pancreatitis.  Has hx of seizures most likely related to alcohol.  Has developed  progressive delirium likely related to alcohol withdrawal.  He will be started on precedex with as needed benzo's.  Will monitor electrolytes and volume status closely.  Continue thiamine, folic acid.  Continue keppra for seizure prevention >> he will need further assessment by neurology when more stable.  Continue solumedrol per GI for acute alcoholic hepatitis.  Updated pt's mother at bedside.  Will place on PCCM service while in ICU.  Coralyn Helling, MD [facility] Pulmonary/Critical Care 01/15/2013, 4:14 PM Pager:  [phone] After 3pm call: [phone]

## 2013-01-15 NOTE — Progress Notes (Signed)
Labs this morning showed his glucose at 68, checked a CBG and it was 64. Patient given orange juice and graham crackers. Will recheck and report off to day shift nurse.  Ernesta Amble, RN

## 2013-01-16 ENCOUNTER — Inpatient Hospital Stay (HOSPITAL_COMMUNITY): Payer: BC Managed Care – PPO

## 2013-01-16 DIAGNOSIS — R740 Nonspecific elevation of levels of transaminase and lactic acid dehydrogenase [LDH]: Secondary | ICD-10-CM

## 2013-01-16 DIAGNOSIS — F10231 Alcohol dependence with withdrawal delirium: Secondary | ICD-10-CM

## 2013-01-16 DIAGNOSIS — E876 Hypokalemia: Secondary | ICD-10-CM

## 2013-01-16 DIAGNOSIS — I426 Alcoholic cardiomyopathy: Secondary | ICD-10-CM | POA: Diagnosis not present

## 2013-01-16 DIAGNOSIS — F10239 Alcohol dependence with withdrawal, unspecified: Secondary | ICD-10-CM

## 2013-01-16 DIAGNOSIS — R569 Unspecified convulsions: Secondary | ICD-10-CM

## 2013-01-16 DIAGNOSIS — J810 Acute pulmonary edema: Secondary | ICD-10-CM | POA: Diagnosis not present

## 2013-01-16 DIAGNOSIS — F1010 Alcohol abuse, uncomplicated: Secondary | ICD-10-CM

## 2013-01-16 DIAGNOSIS — R17 Unspecified jaundice: Secondary | ICD-10-CM

## 2013-01-16 DIAGNOSIS — D689 Coagulation defect, unspecified: Secondary | ICD-10-CM

## 2013-01-16 DIAGNOSIS — K759 Inflammatory liver disease, unspecified: Secondary | ICD-10-CM

## 2013-01-16 DIAGNOSIS — D696 Thrombocytopenia, unspecified: Secondary | ICD-10-CM

## 2013-01-16 DIAGNOSIS — K7290 Hepatic failure, unspecified without coma: Secondary | ICD-10-CM

## 2013-01-16 DIAGNOSIS — J9601 Acute respiratory failure with hypoxia: Secondary | ICD-10-CM | POA: Diagnosis not present

## 2013-01-16 LAB — COMPREHENSIVE METABOLIC PANEL
ALT: 93 U/L — ABNORMAL HIGH (ref 0–53)
Albumin: 2 g/dL — ABNORMAL LOW (ref 3.5–5.2)
Calcium: 8.1 mg/dL — ABNORMAL LOW (ref 8.4–10.5)
GFR calc Af Amer: >90 mL/min (ref >90)
Glucose, Bld: 196 mg/dL — ABNORMAL HIGH (ref 70–99)
Potassium: 4.9 mEq/L (ref 3.5–5.1)
Sodium: 134 mEq/L — ABNORMAL LOW (ref 135–145)
Total Protein: 5.2 g/dL — ABNORMAL LOW (ref 6.0–8.3)

## 2013-01-16 LAB — CBC
Hemoglobin: 11.7 g/dL — ABNORMAL LOW (ref 13.0–17.0)
MCV: 107.4 fL — ABNORMAL HIGH (ref 78.0–100.0)
Platelets: 125 10*3/uL — ABNORMAL LOW (ref 150–400)
RBC: 3.25 MIL/uL — ABNORMAL LOW (ref 4.22–5.81)
WBC: 6 10*3/uL (ref 4.0–10.5)

## 2013-01-16 LAB — PRO B NATRIURETIC PEPTIDE: Pro B Natriuretic peptide (BNP): 282.4 pg/mL — ABNORMAL HIGH (ref 0–125)

## 2013-01-16 LAB — PROTIME-INR: Prothrombin Time: 23 seconds — ABNORMAL HIGH (ref 11.6–15.2)

## 2013-01-16 MED ORDER — PERFLUTREN LIPID MICROSPHERE
1.0000 mL | INTRAVENOUS | Status: AC | PRN
  Administered 2013-01-16: 2 mL via INTRAVENOUS
  Filled 2013-01-16: qty 10

## 2013-01-16 MED ORDER — SODIUM CHLORIDE 0.9 % IV BOLUS (SEPSIS)
750.0000 mL | Freq: Once | INTRAVENOUS | Status: AC
  Administered 2013-01-16: 750 mL via INTRAVENOUS

## 2013-01-16 MED ORDER — DEXMEDETOMIDINE HCL IN NACL 400 MCG/100ML IV SOLN
0.4000 ug/kg/h | INTRAVENOUS | Status: AC
  Administered 2013-01-16 – 2013-01-17 (×4): 1.2 ug/kg/h via INTRAVENOUS
  Filled 2013-01-16 (×4): qty 100

## 2013-01-16 MED ORDER — BIOTENE DRY MOUTH MT LIQD
15.0000 mL | Freq: Two times a day (BID) | OROMUCOSAL | Status: DC
  Administered 2013-01-16 – 2013-02-01 (×30): 15 mL via OROMUCOSAL

## 2013-01-16 MED ORDER — MIDAZOLAM HCL 2 MG/2ML IJ SOLN
INTRAMUSCULAR | Status: AC
  Administered 2013-01-16: 1 mg via INTRAVENOUS
  Filled 2013-01-16: qty 2

## 2013-01-16 MED ORDER — VITAMIN K1 10 MG/ML IJ SOLN
10.0000 mg | Freq: Every day | INTRAMUSCULAR | Status: AC
  Administered 2013-01-16 – 2013-01-17 (×2): 10 mg via SUBCUTANEOUS
  Filled 2013-01-16 (×2): qty 1

## 2013-01-16 MED ORDER — VITAMINS A & D EX OINT
TOPICAL_OINTMENT | CUTANEOUS | Status: AC
  Administered 2013-01-16: 1
  Filled 2013-01-16: qty 5

## 2013-01-16 NOTE — Progress Notes (Signed)
[facility] PROGRESS NOTE  Eric Mueller RUE:[id number] DOB: 03-20-1986 DOA: 01/13/2013 PCP: Lucilla Edin, MD Events of last p.m. Noted, patient with severe DTs transferred to ICU and remains on Precedex drip this a.m. Discussed with CCM and patient now transferred to service, appreciate the assistance. Please call us as needed.   Kela Millin  [facility] Pager [phone]. If 7PM-7AM, please contact night-coverage at [email or web address], password [facility] 01/16/2013, 9:16 AM  LOS: 3 days

## 2013-01-16 NOTE — Progress Notes (Signed)
Clinical Social Work  Per chart review, patient remains disoriented. CSW will follow up at later time in order to discuss SA resources. Please call if any needs arise.  [place], [place] [phone]

## 2013-01-16 NOTE — Progress Notes (Signed)
[facility] ADULT ICU REPLACEMENT PROTOCOL FOR AM LAB REPLACEMENT ONLY  The patient does not apply for the [facility] Adult ICU Electrolyte Replacment Protocol based on the criteria listed below:   2. Is urine output >/= 0.5 ml/kg/hr for the last 6 hours? no Patient's UOP is (none charted for  Selected time period ) ml/kg/hr  4. Abnormal electrolyte(s): Mg 1.6  6. If a panic level lab has been reported, has the CCM MD in charge been notified? yes.   Physician:  Dr. Higinio Plan, Lerin Jech A 01/16/2013 5:33 AM

## 2013-01-16 NOTE — Progress Notes (Signed)
Echocardiogram 2D Echocardiogram has been performed.  Deshawna Mcneece 01/16/2013, 1:48 PM

## 2013-01-16 NOTE — Significant Event (Signed)
CXR with diffuse bilateral airspace disease. Think that this is a mix of atx and edema. In this setting worried about possible ETOH related CM. Clearly these changes on CXR explain his hypoxia  Plan Stat echo Ck BNP PCT, if elevated would rx as aspiration   Anders Simmonds ACNP-BC [facility] Pulmonary/Critical Care Pager # [phone] OR # [phone] if no answer

## 2013-01-16 NOTE — Progress Notes (Signed)
PULMONARY  / CRITICAL CARE MEDICINE  Name: Eric Mueller MRN: [id number] DOB: 1986-01-30    ADMISSION DATE:  01/13/2013 CONSULTATION DATE:  9/18  REFERRING MD :  Suanne Marker PRIMARY SERVICE:  [facility]  CHIEF COMPLAINT:  DTs/acute encephalopathy   BRIEF PATIENT DESCRIPTION:  37 yom admitted 9/16 w/ acute alcoholic hepatitis. PCCM asked to see on 9/19 for progressive acute encephalopathy/ DTs.   SIGNIFICANT EVENTS / STUDIES:  [place] abd/pelvis 9/16: Portal and hepatic veins are patent with normal flow. Hepatomegally, GB sludge.  [place] abd 9/19>>>  LINES / TUBES:   CULTURES: MRSA PCR 9/16: neg   ANTIBIOTICS:   SUBJECTIVE:  Agitated at times  VITAL SIGNS: Temp:  [97.7 F (36.5 C)-98.4 F (36.9 C)] 97.7 F (36.5 C) (09/19 0000) Pulse Rate:  [33-112] 69 (09/19 0700) Resp:  [15-38] 21 (09/19 0700) BP: (95-179)/(66-125) 95/69 mmHg (09/19 0700) SpO2:  [90 %-99 %] 95 % (09/19 0700) FiO2 (%):  [35 %-75 %] 75 % (09/19 0500) Weight:  [78.7 kg (173 lb 8 oz)] 78.7 kg (173 lb 8 oz) (09/19 0600) 100%  HEMODYNAMICS:   VENTILATOR SETTINGS: Vent Mode:  [-]  FiO2 (%):  [35 %-75 %] 75 % INTAKE / OUTPUT: Intake/Output     09/18 0701 - 09/19 0700 09/19 0701 - 09/20 0700   P.O. 120    I.V. (mL/kg) 2002 (25.4)    IV Piggyback 410    Total Intake(mL/kg) 2532 (32.2)    Urine (mL/kg/hr) 1600 (0.8) 225 (0.9)   Total Output 1600 225   Net +932 -225        Stool Occurrence       PHYSICAL EXAMINATION: General:  Resting comfortably but agitated and restless when awake  Neuro:  Agitated, confused and combative,strength equal  HEENT:  Cardiovascular:  RRR Lungs:  Clear, decreased  Abdomen:  Firm, hypoactive + bowel sounds  Musculoskeletal:  intact Skin:  abrasion left knee   LABS:  CBC Recent Labs     01/14/13  0330  01/15/13  0439  01/16/13  0346  WBC  7.0  8.2  6.0  HGB  10.3*  10.6*  11.7*  HCT  29.3*  31.1*  34.9*  PLT  90*  103*  125*   Coag's Recent Labs     01/13/13  1949   01/14/13  0330  01/15/13  0439  01/16/13  0346  APTT  52*  56*   --    --   INR  1.82*  2.08*  2.12*  2.11*   BMET Recent Labs     01/14/13  0330  01/15/13  0439  01/16/13  0346  NA  134*  137  134*  K  2.7*  3.2*  4.9  CL  94*  100  100  CO2  29  26  25   BUN  <3*  <3*  <3*  CREATININE  0.54  0.52  0.54  GLUCOSE  79  68*  196*   Electrolytes Recent Labs     01/13/13  1949  01/14/13  0330  01/15/13  0439  01/16/13  0346  CALCIUM  8.7  7.4*  7.9*  8.1*  MG  1.3*  1.8   --   1.6  PHOS   --    --    --   2.9   Sepsis Markers No results found for this basename: LACTICACIDVEN, PROCALCITON, O2SATVEN,  in the last 72 hours ABG No results found for this basename: PHART, PCO2ART, PO2ART,  in the last 72 hours Liver Enzymes Recent Labs     01/14/13  0330  01/15/13  0439  01/16/13  0346  AST  464*  360*  252*  ALT  127*  106*  93*  ALKPHOS  208*  193*  183*  BILITOT  12.1*  14.2*  17.5*  ALBUMIN  2.2*  2.2*  2.0*   Cardiac Enzymes No results found for this basename: TROPONINI, PROBNP,  in the last 72 hours Glucose Recent Labs     01/15/13  0603  01/15/13  0640  01/15/13  1622  GLUCAP  64*  69*  85    Imaging No results found.   CXR:   ASSESSMENT / PLAN:  PULMONARY A:  Acute hypoxic resp failure Desaturates significantly when sleeping but requiring NRB which seems out of proportion to be explained by mouth breathing. Favor atelectasis as a contributing factor, abd also firm so worried about ascites.  P:   Wean FIO2 Try to find balance w/ precedex (lowest but most effective dose) CXR r/o atx Abd [place] r/o ascites    CARDIOVASCULAR A:  tachycardia d/t w/d. Improved on precedex  mild hypotension-->volume depletion and precedex  P:  Tele ivfs Fluid challenge Would use low dose neo if needed to support BP so that we can cont precedex    RENAL A:   No acute issue  P:   Keep volume status even Avoid insensible loss F/u chem    GASTROINTESTINAL A:   Acute alcoholic hepatitis  GI following.  LFTs are improving   P:   Steroids per GI F/u chem  NPO   HEMATOLOGIC A:  Mild Macrocytic anemia  Thrombocytopenia Hepatitis related coagulopathy  P:  Replace Vit K Transfuse for hgb <7 F/u cbc   INFECTIOUS A:  No acute infection  P:   Trend cbc and fever curve   ENDOCRINE A:   Episodes of hypoglycemia P:   Trend cbg No ssi unless consistently >180   NEUROLOGIC A:   Acute encephalopathy Delirium tremens  Agitation is much better w/ precedex gtt started 9/19  P:   Supportive care Cont  Precedex and folate/thiamine  Add clonidine either 9/20 or 9/21 depending on BP   TODAY'S SUMMARY:  Worsening DTs. Needs precedex and ICU level care.     Lenn Cal NP Pulmonary and Critical Care Medicine [facility] Pager: [phone]  01/16/2013, 10:18 AM   STAFF NOTE: I, Dr Lavinia Sharps have personally reviewed patient's available data, including medical history, events of note, physical examination and test results as part of my evaluation. I have discussed with resident/NP and other care providers such as pharmacist, RN and RRT.  In addition,  I personally evaluated patient and elicited key findings of sevefe acute encephalopathy wuith RASS +4 due to  etoh withdrawal. Started precedex. Might head towards intubation  Rest per NP/medical resident whose note is outlined above and that I agree with  The patient is critically ill with multiple organ systems failure and requires high complexity decision making for assessment and support, frequent evaluation and titration of therapies, application of advanced monitoring technologies and extensive interpretation of multiple databases.   Critical Care Time devoted to patient care services described in this note is  35  Minutes.  Dr. Kalman Shan, M.D., [facility].C.P Pulmonary and Critical Care Medicine Staff Physician [facility]  Pulmonary and Critical Care Pager: [phone], If no answer or between  15:00h - 7:00h: call [phone]  01/16/2013 11:29 AM

## 2013-01-16 NOTE — Progress Notes (Signed)
Subjective: No acute events.  Objective: Vital signs in last 24 hours: Temp:  [97.7 F (36.5 C)-98.4 F (36.9 C)] 97.7 F (36.5 C) (09/19 0000) Pulse Rate:  [33-112] 69 (09/19 0700) Resp:  [15-38] 21 (09/19 0700) BP: (95-179)/(66-125) 95/69 mmHg (09/19 0700) SpO2:  [90 %-99 %] 95 % (09/19 0700) FiO2 (%):  [35 %-75 %] 75 % (09/19 0500) Weight:  [173 lb 8 oz (78.7 kg)] 173 lb 8 oz (78.7 kg) (09/19 0600) Last BM Date: 01/14/13  Intake/Output from previous day: 09/18 0701 - 09/19 0700 In: 2532 [P.O.:120; I.V.:2002; IV Piggyback:410] Out: 1600 [Urine:1600] Intake/Output this shift: Total I/O In: -  Out: 225 [Urine:225]  General appearance: sedated. GI: mildly distended, tympanic, nontender  Lab Results:  Recent Labs  01/14/13 0330 01/15/13 0439 01/16/13 0346  WBC 7.0 8.2 6.0  HGB 10.3* 10.6* 11.7*  HCT 29.3* 31.1* 34.9*  PLT 90* 103* 125*   BMET  Recent Labs  01/14/13 0330 01/15/13 0439 01/16/13 0346  NA 134* 137 134*  K 2.7* 3.2* 4.9  CL 94* 100 100  CO2 29 26 25   GLUCOSE 79 68* 196*  BUN <3* <3* <3*  CREATININE 0.54 0.52 0.54  CALCIUM 7.4* 7.9* 8.1*   LFT  Recent Labs  01/13/13 1949  01/16/13 0346  PROT 6.1  < > 5.2*  ALBUMIN 2.7*  < > 2.0*  AST 559*  < > 252*  ALT 149*  < > 93*  ALKPHOS 244*  < > 183*  BILITOT 13.6*  < > 17.5*  BILIDIR 9.2*  --   --   IBILI 4.4*  --   --   < > = values in this interval not displayed. PT/INR  Recent Labs  01/15/13 0439 01/16/13 0346  LABPROT 23.1* 23.0*  INR 2.12* 2.11*   Hepatitis Panel  Recent Labs  01/13/13 2247  HEPBSAG NEGATIVE  HCVAB NEGATIVE  HEPAIGM NEGATIVE  HEPBIGM NEGATIVE   C-Diff No results found for this basename: CDIFFTOX,  in the last 72 hours Fecal Lactopherrin No results found for this basename: FECLLACTOFRN,  in the last 72 hours  Studies/Results: [place] Art/ven Flow Abd Pelv Doppler  01/14/2013   CLINICAL DATA:  Alcohol abuse.  Acute liver injury.  EXAM: DUPLEX ULTRASOUND  OF LIVER  TECHNIQUE: Color and duplex Doppler ultrasound was performed to evaluate the hepatic in-flow and out-flow vessels.  COMPARISON:  None.  FINDINGS: Portal Vein Velocities  Main:  27.7 cm/sec  Right:  14.8 cm/sec  Left:  11.1 cm/sec  Hepatic Vein Velocities  Right:  40.5 cm/sec  Middle:  31.5 cm/sec  Left:  29.7 cm/sec  Hepatic Artery Velocity:  100.6 cm/sec  Splenic Vein Velocity:  38.5. Cm/sec  Varices: Absent.  Ascites: Absent.  Additional findings: Hepatomegaly. Gallbladder sludge is noted. Portal vein and its branches are hepatopetal and flow. Hepatic veins are hepatofugal flow. The splenic vein is patent with normal directionality of flow.  IMPRESSION: Portal and hepatic veins are patent with normal flow.   Electronically Signed   By: Maryclare Bean M.D.   On: 01/14/2013 09:51    Medications:  Scheduled: . antiseptic oral rinse  15 mL Mouth Rinse BID  . folic acid  1 mg Intravenous Daily   Or  . folic acid  1 mg Oral Daily  . levETIRAcetam  1,000 mg Intravenous Q12H  . methylPREDNISolone (SOLU-MEDROL) injection  40 mg Intravenous Daily  . multivitamin with minerals  1 tablet Oral Daily  . pantoprazole (PROTONIX) IV  40 mg Intravenous BID  . phytonadione  5 mg Oral Daily  . sodium chloride  3 mL Intravenous Q12H  . thiamine  100 mg Oral Daily   Or  . thiamine  100 mg Intravenous Daily  . thiamine  100 mg Intravenous Daily   Or  . thiamine  100 mg Oral Daily   Continuous: . dexmedetomidine 0.6 mcg/kg/hr (01/16/13 0727)  . dextrose 5 % and 0.45 % NaCl with KCl 20 mEq/L 125 mL/hr at 01/15/13 1636    Assessment/Plan: 1) ETOH hepatitis. 2) DTs.   His liver enzymes have improved.  Today will be the second day of steroids.  Hopefully he will continue to improve.  Plan: 1) Continue with Solumedrol. 2) Supportive care for his DTs.   LOS: 3 days   Eric Mueller D 01/16/2013, 9:04 AM

## 2013-01-16 NOTE — Progress Notes (Signed)
PULMONARY  / CRITICAL CARE MEDICINE  Name: ZIQUAN FIDEL MRN: [id number] DOB: 08-16-1985    ADMISSION DATE:  01/13/2013 CONSULTATION DATE:  9/18  REFERRING MD :  Suanne Marker PRIMARY SERVICE:  [facility]  CHIEF COMPLAINT:  DTs/acute encephalopathy   BRIEF PATIENT DESCRIPTION:  89 yom admitted 9/16 w/ acute alcoholic hepatitis. PCCM asked to see on 9/19 for progressive acute encephalopathy/ DTs.   SIGNIFICANT EVENTS / STUDIES:  [place] abd/pelvis 9/16>>> Portal and hepatic veins are patent with normal flow. Hepatomegally, GB sludge.  [place] abd 9/19>>>no ascites Echo 9/19>>>  LINES / TUBES:   CULTURES: MRSA PCR 9/16: neg   ANTIBIOTICS:   SUBJECTIVE:  Agitated at times   VITAL SIGNS: Temp:  [96.9 F (36.1 C)-98.4 F (36.9 C)] 97.3 F (36.3 C) (09/19 1200) Pulse Rate:  [33-112] 67 (09/19 1300) Resp:  [15-42] 21 (09/19 1300) BP: (82-179)/(56-125) 92/64 mmHg (09/19 1300) SpO2:  [90 %-99 %] 91 % (09/19 1300) FiO2 (%):  [35 %-75 %] 75 % (09/19 0500) Weight:  [78.7 kg (173 lb 8 oz)] 78.7 kg (173 lb 8 oz) (09/19 0600) Partial non breath mask  HEMODYNAMICS:   VENTILATOR SETTINGS: Vent Mode:  [-]  FiO2 (%):  [35 %-75 %] 75 % INTAKE / OUTPUT: Intake/Output     09/18 0701 - 09/19 0700 09/19 0701 - 09/20 0700   P.O. 120    I.V. (mL/kg) 2002 (25.4)    IV Piggyback 410    Total Intake(mL/kg) 2532 (32.2)    Urine (mL/kg/hr) 1600 (0.8) 225 (0.4)   Total Output 1600 225   Net +932 -225        Stool Occurrence       PHYSICAL EXAMINATION: General:  Resting comfortably but agitated and restless when awake  Neuro:  Agitated, confused and combative,strength equal  HEENT:  Cardiovascular:  RRR Lungs:  Clear, decreased. Some rhonchi  Abdomen:  Firm, hypoactive + bowel sounds  Musculoskeletal:  intact Skin:  abrasion left knee   LABS:  CBC Recent Labs     01/14/13  0330  01/15/13  0439  01/16/13  0346  WBC  7.0  8.2  6.0  HGB  10.3*  10.6*  11.7*  HCT  29.3*  31.1*  34.9*  PLT   90*  103*  125*   Coag's Recent Labs     01/13/13  1949  01/14/13  0330  01/15/13  0439  01/16/13  0346  APTT  52*  56*   --    --   INR  1.82*  2.08*  2.12*  2.11*   BMET Recent Labs     01/14/13  0330  01/15/13  0439  01/16/13  0346  NA  134*  137  134*  K  2.7*  3.2*  4.9  CL  94*  100  100  CO2  29  26  25   BUN  <3*  <3*  <3*  CREATININE  0.54  0.52  0.54  GLUCOSE  79  68*  196*   Electrolytes Recent Labs     01/13/13  1949  01/14/13  0330  01/15/13  0439  01/16/13  0346  CALCIUM  8.7  7.4*  7.9*  8.1*  MG  1.3*  1.8   --   1.6  PHOS   --    --    --   2.9   Sepsis Markers Recent Labs     01/16/13  1230  PROCALCITON  0.70   ABG  No results found for this basename: PHART, PCO2ART, PO2ART,  in the last 72 hours  Liver Enzymes Recent Labs     01/14/13  0330  01/15/13  0439  01/16/13  0346  AST  464*  360*  252*  ALT  127*  106*  93*  ALKPHOS  208*  193*  183*  BILITOT  12.1*  14.2*  17.5*  ALBUMIN  2.2*  2.2*  2.0*   Cardiac Enzymes Recent Labs     01/16/13  1230  PROBNP  282.4*   Glucose Recent Labs     01/15/13  0603  01/15/13  0640  01/15/13  1622  GLUCAP  64*  69*  85    Imaging Dg Chest Port 1 View  01/16/2013   CLINICAL DATA:  Seizure disorder. Liver problems.  EXAM: PORTABLE CHEST - 1 VIEW  COMPARISON:  01/13/2013.  FINDINGS: Significant change since the prior examination with bilateral pleural effusions and diffuse asymmetric airspace disease. This may represent pulmonary edema with pleural effusions although infectious infiltrate not excluded in the proper clinical setting.  Patient has a history of pancreatitis and pancreatitis contributing to the lung parenchyma changes not excluded.  No gross pneumothorax.  Heart size top normal to minimally enlarged.  IMPRESSION: Significant change since the prior examination with bilateral pleural effusions and diffuse asymmetric airspace disease. This may represent pulmonary edema with pleural  effusions although infectious infiltrate not excluded in the proper clinical setting.  This is a call report.   Electronically Signed   By: Bridgett Larsson   On: 01/16/2013 12:02     CXR: bilateral airspace disease   ASSESSMENT / PLAN:  PULMONARY A:  Acute hypoxic resp failure in setting of bilateral pulmonary infiltrates. 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 Prelim EF depressed (awaiting formal read). Worried about ETOH related CM P:  Tele Keep even fluid balance for now BP too soft for diuresis  Would use low dose neo if needed to support BP so that we can cont precedex    RENAL A:   No acute issue  P:   Keep volume status even Avoid insensible loss F/u chem   GASTROINTESTINAL A:   Acute alcoholic hepatitis  GI following.  P:   Steroids per GI F/u chem  NPO   HEMATOLOGIC A:  Mild Macrocytic anemia  Thrombocytopenia Hepatitis related coagulopathy  P:  Replace Vit K Transfuse for hgb <7 F/u cbc   INFECTIOUS A:  No acute infection  PCT re-assuring  P:   Trend cbc and fever curve    ENDOCRINE A:   Episodes of hypoglycemia P:   Trend cbg No ssi unless consistently >180   NEUROLOGIC A:   Acute encephalopathy Delirium tremens  Agitation is much better w/ precedex gtt started 9/19 but he still has episodes where very agitated when he is awake P:   Supportive care Cont  Precedex and folate/thiamine  Add clonidine either 9/20 or 9/21 depending on BP   TODAY'S SUMMARY:  Worsening DTs. Needs precedex and ICU level care.   Anders Simmonds ACNP-BC [facility] Pulmonary/Critical Care Pager # [phone] OR # [phone] if no answer   01/16/2013 1:56 PM   Reviewed above,  examined  pt.  His agitation is improved, but still needing high dose of precedex.  He has progressive hypoxia with CXR findings suggestive of pulmonary edema and pleural effusions.  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  Decrease IV fluids >> hold diuresis for now.  He is maintaining his airway, and oxygenation improved with increased FiO2 >> will defer intubation for now, but high risk needing this at some point if he does not improve.  CC time 50 minutes.  Coralyn Helling, MD [facility] Pulmonary/Critical Care 01/16/2013, 3:35 PM Pager:  [phone] After 3pm call: [phone]

## 2013-01-17 ENCOUNTER — Inpatient Hospital Stay (HOSPITAL_COMMUNITY): Payer: BC Managed Care – PPO

## 2013-01-17 DIAGNOSIS — J90 Pleural effusion, not elsewhere classified: Secondary | ICD-10-CM

## 2013-01-17 DIAGNOSIS — J9600 Acute respiratory failure, unspecified whether with hypoxia or hypercapnia: Secondary | ICD-10-CM

## 2013-01-17 DIAGNOSIS — K7290 Hepatic failure, unspecified without coma: Secondary | ICD-10-CM

## 2013-01-17 DIAGNOSIS — F10231 Alcohol dependence with withdrawal delirium: Secondary | ICD-10-CM

## 2013-01-17 DIAGNOSIS — I426 Alcoholic cardiomyopathy: Secondary | ICD-10-CM

## 2013-01-17 DIAGNOSIS — G40909 Epilepsy, unspecified, not intractable, without status epilepticus: Secondary | ICD-10-CM

## 2013-01-17 DIAGNOSIS — R17 Unspecified jaundice: Secondary | ICD-10-CM

## 2013-01-17 DIAGNOSIS — D696 Thrombocytopenia, unspecified: Secondary | ICD-10-CM

## 2013-01-17 DIAGNOSIS — J810 Acute pulmonary edema: Secondary | ICD-10-CM

## 2013-01-17 LAB — GLUCOSE, CAPILLARY
Glucose-Capillary: 122 mg/dL — ABNORMAL HIGH (ref 70–99)
Glucose-Capillary: 115 mg/dL — ABNORMAL HIGH (ref 70–99)
Glucose-Capillary: 141 mg/dL — ABNORMAL HIGH (ref 70–99)
Glucose-Capillary: 101 mg/dL — ABNORMAL HIGH (ref 70–99)

## 2013-01-17 LAB — CBC
Hemoglobin: 11.8 g/dL — ABNORMAL LOW (ref 13.0–17.0)
MCH: 36.8 pg — ABNORMAL HIGH (ref 26.0–34.0)
MCV: 108.7 fL — ABNORMAL HIGH (ref 78.0–100.0)
Platelets: 135 10*3/uL — ABNORMAL LOW (ref 150–400)
RBC: 3.21 MIL/uL — ABNORMAL LOW (ref 4.22–5.81)
WBC: 10.3 10*3/uL (ref 4.0–10.5)

## 2013-01-17 LAB — BODY FLUID CELL COUNT WITH DIFFERENTIAL
Eos, Fluid: 0 %
Lymphs, Fluid: 54 %
Neutrophil Count, Fluid: 20 % (ref 0–25)
Total Nucleated Cell Count, Fluid: 163 cu mm (ref 0–1000)

## 2013-01-17 LAB — COMPREHENSIVE METABOLIC PANEL
ALT: 73 U/L — ABNORMAL HIGH (ref 0–53)
AST: 175 U/L — ABNORMAL HIGH (ref 0–37)
CO2: 24 mEq/L (ref 19–32)
Calcium: 7.7 mg/dL — ABNORMAL LOW (ref 8.4–10.5)
Chloride: 104 mEq/L (ref 96–112)
GFR calc Af Amer: >90 mL/min (ref >90)
GFR calc non Af Amer: >90 mL/min (ref >90)
Glucose, Bld: 130 mg/dL — ABNORMAL HIGH (ref 70–99)
Sodium: 135 mEq/L (ref 135–145)
Total Bilirubin: 18.8 mg/dL (ref 0.3–1.2)

## 2013-01-17 LAB — LACTATE DEHYDROGENASE: LDH: 391 U/L — ABNORMAL HIGH (ref 94–250)

## 2013-01-17 LAB — LACTATE DEHYDROGENASE, PLEURAL OR PERITONEAL FLUID: LD, Fluid: 131 U/L — ABNORMAL HIGH (ref 3–23)

## 2013-01-17 LAB — PH, BODY FLUID

## 2013-01-17 LAB — AMYLASE, BODY FLUID: Amylase, Fluid: 15 U/L

## 2013-01-17 MED ORDER — VITAMINS A & D EX OINT
TOPICAL_OINTMENT | CUTANEOUS | Status: AC
  Administered 2013-01-17: 1
  Filled 2013-01-17: qty 5

## 2013-01-17 MED ORDER — METHYLPREDNISOLONE SODIUM SUCC 40 MG IJ SOLR
40.0000 mg | INTRAMUSCULAR | Status: DC
  Administered 2013-01-17 – 2013-02-02 (×17): 40 mg via INTRAVENOUS
  Filled 2013-01-17 (×17): qty 1

## 2013-01-17 MED ORDER — SODIUM CHLORIDE 0.9 % IV SOLN
1.5000 g | Freq: Four times a day (QID) | INTRAVENOUS | Status: DC
  Administered 2013-01-17 – 2013-01-19 (×9): 1.5 g via INTRAVENOUS
  Filled 2013-01-17 (×11): qty 1.5

## 2013-01-17 MED ORDER — DOBUTAMINE IN D5W 4-5 MG/ML-% IV SOLN
INTRAVENOUS | Status: AC
  Filled 2013-01-17: qty 250

## 2013-01-17 MED ORDER — FUROSEMIDE 10 MG/ML IJ SOLN
INTRAMUSCULAR | Status: AC
  Filled 2013-01-17: qty 4

## 2013-01-17 MED ORDER — ALBUTEROL SULFATE (5 MG/ML) 0.5% IN NEBU
2.5000 mg | INHALATION_SOLUTION | Freq: Four times a day (QID) | RESPIRATORY_TRACT | Status: DC
  Administered 2013-01-17 – 2013-01-19 (×9): 2.5 mg via RESPIRATORY_TRACT
  Filled 2013-01-17 (×8): qty 0.5

## 2013-01-17 MED ORDER — LORAZEPAM 1 MG PO TABS
1.0000 mg | ORAL_TABLET | Freq: Four times a day (QID) | ORAL | Status: DC
  Administered 2013-01-17 – 2013-01-20 (×10): 1 mg via ORAL
  Filled 2013-01-17 (×9): qty 1

## 2013-01-17 MED ORDER — DOBUTAMINE IN D5W 4-5 MG/ML-% IV SOLN
2.5000 ug/kg/min | INTRAVENOUS | Status: DC
  Filled 2013-01-17: qty 250

## 2013-01-17 MED ORDER — FUROSEMIDE 10 MG/ML IJ SOLN
40.0000 mg | Freq: Four times a day (QID) | INTRAMUSCULAR | Status: AC
  Administered 2013-01-17 (×2): 40 mg via INTRAVENOUS
  Filled 2013-01-17: qty 4

## 2013-01-17 MED ORDER — DOBUTAMINE IN D5W 4-5 MG/ML-% IV SOLN
5.0000 ug/kg/min | INTRAVENOUS | Status: DC
  Administered 2013-01-17: 5 ug/kg/min via INTRAVENOUS
  Filled 2013-01-17: qty 250

## 2013-01-17 MED ORDER — DOBUTAMINE-DEXTROSE 2-5 MG/ML-% IV SOLN
5.0000 ug/kg/min | INTRAVENOUS | Status: DC
Start: 2013-01-17 — End: 2013-01-17
  Filled 2013-01-17: qty 250

## 2013-01-17 NOTE — Progress Notes (Signed)
Provided Pt with community SA tx information.  Pt was uninterested in having CSW review this information with him, as Pt stated, jokingly, "I can read."  CSW thanked Pt for his time.  CSW available to discuss SA information, should Pt change his mind.  No further CSW needs identified.  CSW to sign off.  Providence Crosby, LCSWA Clinical Social Work [phone]

## 2013-01-17 NOTE — Progress Notes (Signed)
PULMONARY  / CRITICAL CARE MEDICINE  Name: Eric Mueller MRN: [id number] DOB: 08/19/1985    ADMISSION DATE:  01/13/2013 CONSULTATION DATE:  9/18  REFERRING MD :  Suanne Marker PRIMARY SERVICE:  [facility]  CHIEF COMPLAINT:  DTs/acute encephalopathy   BRIEF PATIENT DESCRIPTION:  42 yom admitted 9/16 w/ acute alcoholic hepatitis. PCCM asked to see on 9/19 for progressive acute encephalopathy/ DTs.   SIGNIFICANT EVENTS / STUDIES:  [place] abd/pelvis 9/16>>> Portal and hepatic veins are patent with normal flow. Hepatomegally, GB sludge.  [place] abd 9/19>>>no ascites Echo 9/19>>>EF 45-55% diffuse hypokinesis  LINES / TUBES: PIV  CULTURES: MRSA PCR 9/16: neg   ANTIBIOTICS: Unasyn 9/20  SUBJECTIVE:  Less agitated, more appropriate, still very hypoxic.  I>>0  VITAL SIGNS: Temp:  [97 F (36.1 C)-98.1 F (36.7 C)] 97 F (36.1 C) (09/20 0410) Pulse Rate:  [67-80] 77 (09/20 0730) Resp:  [20-42] 39 (09/20 0730) BP: (78-103)/(41-78) 100/66 mmHg (09/20 0700) SpO2:  [90 %-97 %] 91 % (09/20 0730) FiO2 (%):  [75 %] 75 % (09/20 0730) Weight:  [72.5 kg (159 lb 13.3 oz)] 72.5 kg (159 lb 13.3 oz) (09/20 0410) Partial non breath mask  HEMODYNAMICS: BP soft   VENTILATOR SETTINGS: Vent Mode:  [-]  FiO2 (%):  [75 %] 75 % INTAKE / OUTPUT: Intake/Output     09/19 0701 - 09/20 0700 09/20 0701 - 09/21 0700   P.O.     I.V. (mL/kg) 2007 (27.7) 75 (1)   IV Piggyback 160    Total Intake(mL/kg) 2167 (29.9) 75 (1)   Urine (mL/kg/hr) 1380 (0.8) 30 (0.3)   Total Output 1380 30   Net +787 +45        Urine Occurrence 1 x      PHYSICAL EXAMINATION: General:  Resting comfortably and less agitated  Neuro:  Rass 0, less agitation HEENT:  Cardiovascular:  RRR Lungs:  Decreased BS 1/2 up bilaterally.  Rhonchi and consolidation BS Abdomen:  Firm, hypoactive + bowel sounds  Musculoskeletal:  intact Skin:  abrasion left knee   LABS:  CBC Recent Labs     01/15/13  0439  01/16/13  0346  01/17/13  0338   WBC  8.2  6.0  10.3  HGB  10.6*  11.7*  11.8*  HCT  31.1*  34.9*  34.9*  PLT  103*  125*  135*   Coag's Recent Labs     01/15/13  0439  01/16/13  0346  INR  2.12*  2.11*   BMET Recent Labs     01/15/13  0439  01/16/13  0346  01/17/13  0338  NA  137  134*  135  K  3.2*  4.9  3.8  CL  100  100  104  CO2  26  25  24   BUN  <3*  <3*  3*  CREATININE  0.52  0.54  0.53  GLUCOSE  68*  196*  130*   Electrolytes Recent Labs     01/15/13  0439  01/16/13  0346  01/17/13  0338  CALCIUM  7.9*  8.1*  7.7*  MG   --   1.6   --   PHOS   --   2.9   --    Sepsis Markers Recent Labs     01/16/13  1230  01/17/13  0338  PROCALCITON  0.70  0.73   ABG No results found for this basename: PHART, PCO2ART, PO2ART,  in the last 72 hours  Liver Enzymes  Recent Labs     01/15/13  0439  01/16/13  0346  01/17/13  0338  AST  360*  252*  175*  ALT  106*  93*  73*  ALKPHOS  193*  183*  162*  BILITOT  14.2*  17.5*  18.8*  ALBUMIN  2.2*  2.0*  1.8*   Cardiac Enzymes Recent Labs     01/16/13  1230  PROBNP  282.4*   Glucose Recent Labs     01/15/13  0640  01/15/13  1622  01/16/13  2020  01/16/13  2343  01/17/13  0341  01/17/13  0754  GLUCAP  69*  85  179*  135*  136*  122*    Imaging US Abdomen Limited  01/16/2013   CLINICAL DATA:  History of pancreatitis. Question ascites.  EXAM: US ABDOMEN LIMITED - RIGHT UPPER QUADRANT  COMPARISON:  01/14/2013 ultrasound and 05/09/2012 CT  FINDINGS: Gallbladder:  Four-quadrant examination without evidence of ascites.  Diffusely echogenic liver consistent with fatty infiltration. Liver was not completely assessed on present exam.  IMPRESSION: No ascites detected.   Electronically Signed   By: Bridgett Larsson   On: 01/16/2013 15:00   Dg Chest Port 1 View  01/16/2013   CLINICAL DATA:  Seizure disorder. Liver problems.  EXAM: PORTABLE CHEST - 1 VIEW  COMPARISON:  01/13/2013.  FINDINGS: Significant change since the prior examination with bilateral  pleural effusions and diffuse asymmetric airspace disease. This may represent pulmonary edema with pleural effusions although infectious infiltrate not excluded in the proper clinical setting.  Patient has a history of pancreatitis and pancreatitis contributing to the lung parenchyma changes not excluded.  No gross pneumothorax.  Heart size top normal to minimally enlarged.  IMPRESSION: Significant change since the prior examination with bilateral pleural effusions and diffuse asymmetric airspace disease. This may represent pulmonary edema with pleural effusions although infectious infiltrate not excluded in the proper clinical setting.  This is a call report.   Electronically Signed   By: Bridgett Larsson   On: 01/16/2013 12:02     CXR: No film 9/20  ASSESSMENT / PLAN:  PULMONARY A:  Acute hypoxic resp failure in setting of bilateral pulmonary infiltrates. Prob pulm edema/pleural effusion in setting of ETOH CM, also ? Aspiration   P:   Wean fio2 if able Alb BDs Flutter Diurese Add DBA drip Reduce precedex Scheduled ativan   CARDIOVASCULAR A: Tachycardia in setting or ETOH withdrawal - improved with precedex  Mild hypotension--> Pulmonary edema.  EF depressed on Echo 45-50% d/t ETOH  P:  Tele Diurese  Add dobutamine drip  RENAL A:   No acute issue  P:   Diurese F/u chem  Reduce ivf to kvo  GASTROINTESTINAL A:   Acute alcoholic hepatitis  GI following.  P:   Steroids per GI F/u chem  NPO   HEMATOLOGIC A:  Mild Macrocytic anemia  Thrombocytopenia Hepatitis related coagulopathy  P:  Replace Vit K Transfuse for hgb <7 F/u cbc    INFECTIOUS A:  No acute infection  PCT re-assuring  P:   Trend cbc and fever curve    ENDOCRINE A:   Episodes of hypoglycemia P:   Trend cbg No ssi unless consistently >180   NEUROLOGIC A:   Acute encephalopathy Delirium tremens  Agitation is much better w/ precedex gtt , try lower dose P:   Supportive care Cont   Precedex at lower dose and add ativan po Cont  folate/thiamine  TODAY'S SUMMARY:  DTs better, pleural effusions and pulm edema ? Aspiration with acute hypoxemic resp failure.  Plan: diurese, titrate oxygen, BDs, flutter, DBA drip, add unasyn.  CC  Dorcas Carrow Beeper  [phone]  Cell  [phone]  If no response or cell goes to voicemail, call beeper [phone]   01/17/2013 8:20 AM

## 2013-01-17 NOTE — Progress Notes (Signed)
Patient respirations 30-55 bpm.  O2 sats 91-94% on 75% Non-rebreather..  Notified Dr. Delford Field.  Dr. Delford Field  Came to assess the patient. Thoracentesis performed on right side, at the bedside.  800 ml clear, yellow fluid removed.  O2 sats now 98% on 75% non-rebreather.

## 2013-01-17 NOTE — Progress Notes (Addendum)
[facility] Progress Note  I am covering this patient today for Drs. Nicholes Mango.    Since last GI note: He is in ICU with NRB mask in place, jaundice, tells me he feels fine.  Answers questions appropriately.    Objective: Vital signs in last 24 hours: Temp:  [97 F (36.1 C)-98.1 F (36.7 C)] 97 F (36.1 C) (09/20 0410) Pulse Rate:  [67-80] 74 (09/20 0800) Resp:  [20-42] 28 (09/20 0800) BP: (78-103)/(41-78) 90/63 mmHg (09/20 0800) SpO2:  [81 %-97 %] 93 % (09/20 0823) FiO2 (%):  [50 %-75 %] 75 % (09/20 0822) Weight:  [159 lb 13.3 oz (72.5 kg)] 159 lb 13.3 oz (72.5 kg) (09/20 0410) Last BM Date: 01/14/13 General: alert and oriented times 3 Heart: regular rate and rythm Abdomen: soft, non-tender, non-distended, normal bowel sounds   Lab Results:  Recent Labs  01/15/13 0439 01/16/13 0346 01/17/13 0338  WBC 8.2 6.0 10.3  HGB 10.6* 11.7* 11.8*  PLT 103* 125* 135*  MCV 106.1* 107.4* 108.7*    Recent Labs  01/15/13 0439 01/16/13 0346 01/17/13 0338  NA 137 134* 135  K 3.2* 4.9 3.8  CL 100 100 104  CO2 26 25 24   GLUCOSE 68* 196* 130*  BUN <3* <3* 3*  CREATININE 0.52 0.54 0.53  CALCIUM 7.9* 8.1* 7.7*    Recent Labs  01/15/13 0439 01/16/13 0346 01/17/13 0338  PROT 5.2* 5.2* 4.7*  ALBUMIN 2.2* 2.0* 1.8*  AST 360* 252* 175*  ALT 106* 93* 73*  ALKPHOS 193* 183* 162*  BILITOT 14.2* 17.5* 18.8*    Recent Labs  01/15/13 0439 01/16/13 0346  INR 2.12* 2.11*     Studies/Results: US Abdomen Limited  01/16/2013   CLINICAL DATA:  History of pancreatitis. Question ascites.  EXAM: US ABDOMEN LIMITED - RIGHT UPPER QUADRANT  COMPARISON:  01/14/2013 ultrasound and 05/09/2012 CT  FINDINGS: Gallbladder:  Four-quadrant examination without evidence of ascites.  Diffusely echogenic liver consistent with fatty infiltration. Liver was not completely assessed on present exam.  IMPRESSION: No ascites detected.   Electronically Signed   By: Bridgett Larsson   On:  01/16/2013 15:00   Dg Chest Port 1 View  01/16/2013   CLINICAL DATA:  Seizure disorder. Liver problems.  EXAM: PORTABLE CHEST - 1 VIEW  COMPARISON:  01/13/2013.  FINDINGS: Significant change since the prior examination with bilateral pleural effusions and diffuse asymmetric airspace disease. This may represent pulmonary edema with pleural effusions although infectious infiltrate not excluded in the proper clinical setting.  Patient has a history of pancreatitis and pancreatitis contributing to the lung parenchyma changes not excluded.  No gross pneumothorax.  Heart size top normal to minimally enlarged.  IMPRESSION: Significant change since the prior examination with bilateral pleural effusions and diffuse asymmetric airspace disease. This may represent pulmonary edema with pleural effusions although infectious infiltrate not excluded in the proper clinical setting.  This is a call report.   Electronically Signed   By: Bridgett Larsson   On: 01/16/2013 12:02     Medications: Scheduled Meds: . albuterol  2.5 mg Nebulization Q6H  . ampicillin-sulbactam (UNASYN) IV  1.5 g Intravenous Q6H  . antiseptic oral rinse  15 mL Mouth Rinse BID  . folic acid  1 mg Intravenous Daily   Or  . folic acid  1 mg Oral Daily  . furosemide  40 mg Intravenous Q6H  . levETIRAcetam  1,000 mg Intravenous Q12H  . LORazepam  1 mg Oral Q6H  . methylPREDNISolone (  SOLU-MEDROL) injection  40 mg Intravenous Q24H  . multivitamin with minerals  1 tablet Oral Daily  . pantoprazole (PROTONIX) IV  40 mg Intravenous BID  . phytonadione  10 mg Subcutaneous Daily  . sodium chloride  3 mL Intravenous Q12H  . thiamine  100 mg Oral Daily   Or  . thiamine  100 mg Intravenous Daily   Continuous Infusions: . dexmedetomidine 1.001 mcg/kg/hr (01/17/13 0800)  . dextrose 5 % and 0.45 % NaCl with KCl 20 mEq/L 20 mL/hr at 01/16/13 1900  . DOButamine     PRN Meds:.LORazepam, ondansetron (ZOFRAN) IV, ondansetron    Assessment/Plan: 27  y.o. male (likely) alcoholic hepatitis, liver failure  I don't see ANA has been checked and that can cause rapid liver failure.  I agree this is, however, likely etoh related hepatitis, liver failure.  Continue Iv steroids (would help AIH if that is going on), continue supportive care.    Rachael Fee, MD  01/17/2013, 8:30 AM [facility] Pager [phone]

## 2013-01-17 NOTE — Progress Notes (Signed)
eLink Physician-Brief Progress Note Patient Name: Eric Mueller DOB: [birth year] MRN: [id number]  Date of Service  01/17/2013   HPI/Events of Note  Nurse reports that patient has been taking ice chips without difficulty.  CBG is 66.  eICU Interventions  Plan: Advance diet to clear liquids for tonight   Intervention Category Minor Interventions: Routine modifications to care plan (e.g. PRN medications for pain, fever)  DETERDING,ELIZABETH 01/17/2013, 11:56 PM

## 2013-01-17 NOTE — Procedures (Signed)
Thoracentesis Procedure Note  Pre-operative Diagnosis: Bilateral pleural effusions  Post-operative Diagnosis: same  Indications:  Bilateral pleural effusions  Procedure Details  Consent: Informed consent was obtained. Risks of the procedure were discussed including: infection, bleeding, pain, pneumothorax.  Under sterile conditions the patient was positioned.Chlorhexidene and sterile drapes were utilized.  1% buffered lidocaine was used to anesthetize the 8th rib space on both R and L chest wall. Fluid was obtained from Both pleural spaces without any difficulties and minimal blood loss.  A dressing was applied to the wound and wound care instructions were provided.   Findings 650  Ml from L, 800cc from R  of clear pleural fluid was obtained. A sample was sent from the R pleural space.    CXR is pending    Complications:  None; patient tolerated the procedure well.          Condition: stable  Plan A follow up chest x-ray was ordered.  Attending Attestation: I performed the procedure.  Dorcas Carrow Beeper  [phone]  Cell  [phone]  If no response or cell goes to voicemail, call beeper [phone]

## 2013-01-18 ENCOUNTER — Inpatient Hospital Stay (HOSPITAL_COMMUNITY): Payer: BC Managed Care – PPO

## 2013-01-18 LAB — PROTIME-INR
INR: 1.36 (ref 0.00–1.49)
Prothrombin Time: 16.4 seconds — ABNORMAL HIGH (ref 11.6–15.2)

## 2013-01-18 LAB — CBC
Hemoglobin: 11.5 g/dL — ABNORMAL LOW (ref 13.0–17.0)
MCH: 36.5 pg — ABNORMAL HIGH (ref 26.0–34.0)
MCV: 106 fL — ABNORMAL HIGH (ref 78.0–100.0)
Platelets: 154 10*3/uL (ref 150–400)
RBC: 3.15 MIL/uL — ABNORMAL LOW (ref 4.22–5.81)
RDW: 15.2 % (ref 11.5–15.5)
WBC: 10.9 10*3/uL — ABNORMAL HIGH (ref 4.0–10.5)

## 2013-01-18 LAB — GLUCOSE, CAPILLARY
Glucose-Capillary: 66 mg/dL — ABNORMAL LOW (ref 70–99)
Glucose-Capillary: 74 mg/dL (ref 70–99)
Glucose-Capillary: 115 mg/dL — ABNORMAL HIGH (ref 70–99)
Glucose-Capillary: 141 mg/dL — ABNORMAL HIGH (ref 70–99)
Glucose-Capillary: 103 mg/dL — ABNORMAL HIGH (ref 70–99)

## 2013-01-18 LAB — COMPREHENSIVE METABOLIC PANEL
ALT: 67 U/L — ABNORMAL HIGH (ref 0–53)
AST: 177 U/L — ABNORMAL HIGH (ref 0–37)
Albumin: 1.8 g/dL — ABNORMAL LOW (ref 3.5–5.2)
Calcium: 7.8 mg/dL — ABNORMAL LOW (ref 8.4–10.5)
Creatinine, Ser: 0.53 mg/dL (ref 0.50–1.35)
GFR calc non Af Amer: >90 mL/min (ref >90)
Sodium: 136 mEq/L (ref 135–145)
Total Protein: 4.7 g/dL — ABNORMAL LOW (ref 6.0–8.3)

## 2013-01-18 MED ORDER — FUROSEMIDE 10 MG/ML IJ SOLN
40.0000 mg | Freq: Once | INTRAMUSCULAR | Status: AC
  Administered 2013-01-18: 40 mg via INTRAVENOUS
  Filled 2013-01-18: qty 4

## 2013-01-18 MED ORDER — POTASSIUM CHLORIDE CRYS ER 20 MEQ PO TBCR
40.0000 meq | EXTENDED_RELEASE_TABLET | ORAL | Status: AC
  Administered 2013-01-18 (×2): 40 meq via ORAL
  Filled 2013-01-18 (×2): qty 2

## 2013-01-18 MED ORDER — VITAMINS A & D EX OINT
TOPICAL_OINTMENT | CUTANEOUS | Status: AC
  Filled 2013-01-18: qty 5

## 2013-01-18 NOTE — Progress Notes (Signed)
[facility] Progress Note  I am covering this patient today for Drs. Nicholes Mango.   Since last GI note:  Breathing easier, less agitated today.  Father in his room, we all spoke for a long time about his acute liver disease, long term issues.  Objective: Vital signs in last 24 hours: Temp:  [98.4 F (36.9 C)-99.2 F (37.3 C)] 98.4 F (36.9 C) (09/21 0500) Pulse Rate:  [81-123] 115 (09/21 1200) Resp:  [21-45] 36 (09/21 1200) BP: (85-128)/(52-88) 101/55 mmHg (09/21 1200) SpO2:  [94 %-100 %] 95 % (09/21 1200) FiO2 (%):  [50 %-75 %] 50 % (09/20 1445) Last BM Date: 01/14/13 General: alert and oriented times 3, very jaundiced Heart: regular rate and rythm Abdomen: soft, non-tender, non-distended, normal bowel sounds   Lab Results:  Recent Labs  01/16/13 0346 01/17/13 0338 01/18/13 0350  WBC 6.0 10.3 10.9*  HGB 11.7* 11.8* 11.5*  PLT 125* 135* 154  MCV 107.4* 108.7* 106.0*    Recent Labs  01/16/13 0346 01/17/13 0338 01/18/13 0350  NA 134* 135 136  K 4.9 3.8 2.4*  CL 100 104 99  CO2 25 24 28   GLUCOSE 196* 130* 89  BUN <3* 3* 6  CREATININE 0.54 0.53 0.53  CALCIUM 8.1* 7.7* 7.8*    Recent Labs  01/16/13 0346 01/17/13 0338 01/18/13 0350  PROT 5.2* 4.7* 4.7*  ALBUMIN 2.0* 1.8* 1.8*  AST 252* 175* 177*  ALT 93* 73* 67*  ALKPHOS 183* 162* 152*  BILITOT 17.5* 18.8* 21.5*    Recent Labs  01/16/13 0346 01/18/13 0350  INR 2.11* 1.36     Studies/Results: US Abdomen Limited  01/16/2013   CLINICAL DATA:  History of pancreatitis. Question ascites.  EXAM: US ABDOMEN LIMITED - RIGHT UPPER QUADRANT  COMPARISON:  01/14/2013 ultrasound and 05/09/2012 CT  FINDINGS: Gallbladder:  Four-quadrant examination without evidence of ascites.  Diffusely echogenic liver consistent with fatty infiltration. Liver was not completely assessed on present exam.  IMPRESSION: No ascites detected.   Electronically Signed   By: Bridgett Larsson   On: 01/16/2013 15:00   Dg Chest  Port 1 View  01/18/2013   CLINICAL DATA:  Pulmonary edema  EXAM: PORTABLE CHEST - 1 VIEW  COMPARISON:  01/17/2013  FINDINGS: Pulmonary vascular congestion with suspected mild residual interstitial edema, improved. No pleural effusion or pneumothorax.  The heart is normal in size.  IMPRESSION: Pulmonary vascular congestion with suspected mild residual interstitial edema, improved.   Electronically Signed   By: Charline Bills M.D.   On: 01/18/2013 07:15   Dg Chest Port 1 View  01/17/2013   *RADIOLOGY REPORT*  Clinical Data: Status post bilateral thoracentesis evaluate for pneumothorax.  PORTABLE CHEST - 1 VIEW  Comparison: January 16, 2013  Findings: The previously noted opacities of the lateral lungs have significantly decreased.  There is no significant pleural effusion. There are no pleural line is present to suggest pneumothorax.  The mediastinal contour and cardiac silhouette are normal.  There is mild atelectasis of left lung base.  The soft tissues and osseous structures are stable.  IMPRESSION: The previously noted opacities of the lateral lungs have significantly decreased.   There is no pleural effusion.  There is no pneumothorax.   Original Report Authenticated By: Sherian Rein, M.D.     Medications: Scheduled Meds: . albuterol  2.5 mg Nebulization Q6H  . ampicillin-sulbactam (UNASYN) IV  1.5 g Intravenous Q6H  . antiseptic oral rinse  15 mL Mouth Rinse BID  . folic  acid  1 mg Intravenous Daily   Or  . folic acid  1 mg Oral Daily  . levETIRAcetam  1,000 mg Intravenous Q12H  . LORazepam  1 mg Oral Q6H  . methylPREDNISolone (SOLU-MEDROL) injection  40 mg Intravenous Q24H  . multivitamin with minerals  1 tablet Oral Daily  . pantoprazole (PROTONIX) IV  40 mg Intravenous BID  . sodium chloride  3 mL Intravenous Q12H  . thiamine  100 mg Oral Daily   Or  . thiamine  100 mg Intravenous Daily   Continuous Infusions: . DOBUTamine 2.5 mcg/kg/min (01/18/13 1200)   PRN Meds:.LORazepam,  ondansetron (ZOFRAN) IV, ondansetron  ANA still pending  Assessment/Plan: 27 y.o. male with etoh hepatitis  Bili still climbing but transaminases and INR improving (vit k yesterday).  Does not appear encephalopathic, rather still shaky from DTs (but improving).  I was very clear with him and his father that he must never drink etoh again, could die if he does.  I suspect his liver will recover from this acute hepatitis and doubt he has underlying cirrhosis, but it can take months for lfts to get to normalize.  He is eating well.  HE is on steroids, should continue a 4 week course.    Dr. Elnoria Howard to resume care in AM.   Rachael Fee, MD  01/18/2013, 12:37 PM  Gastroenterology Pager [phone]

## 2013-01-18 NOTE — Progress Notes (Signed)
CRITICAL VALUE ALERT  Critical value received: Potassium 2.4, Bilirubin 21.5  Date of notification:  01/18/13  Time of notification:  0500  Critical value read back yes  Nurse who received alert:  Jairo Ben  MD notified (1st page):  0500  Time of first page:  0500  MD notified (2nd page):none Time of second page:  Responding MD:  Dr. Prescilla Sours  Time MD responded:  [phone]

## 2013-01-18 NOTE — Progress Notes (Signed)
Patient got out bed without calling for assistance and pulled out one of his IV's.  Patient was assisted back to bed.Bed linens changed.  Bleeding IV site bandaged.   Left back, site of thoracentesis, has been redressed twice today due to bleeding through the gauze dressing.

## 2013-01-18 NOTE — Progress Notes (Signed)
eLink Physician-Brief Progress Note Patient Name: Eric Mueller DOB: 04-25-1986 MRN: [id number]  Date of Service  01/18/2013   HPI/Events of Note  Hypokalemia   eICU Interventions  Potassium replaced   Intervention Category Intermediate Interventions: Electrolyte abnormality - evaluation and management  DETERDING,ELIZABETH 01/18/2013, 4:57 AM

## 2013-01-18 NOTE — Progress Notes (Addendum)
PULMONARY  / CRITICAL CARE MEDICINE  Name: Eric Mueller MRN: [id number] DOB: 1985/12/07    ADMISSION DATE:  01/13/2013 CONSULTATION DATE:  9/18  REFERRING MD :  Suanne Marker PRIMARY SERVICE:  [facility]  CHIEF COMPLAINT:  DTs/acute encephalopathy   BRIEF PATIENT DESCRIPTION:  17 yom admitted 9/16 w/ acute alcoholic hepatitis. PCCM asked to see on 9/19 for progressive acute encephalopathy/ DTs.   SIGNIFICANT EVENTS / STUDIES:  [place] abd/pelvis 9/16>>> Portal and hepatic veins are patent with normal flow. Hepatomegally, GB sludge.  [place] abd 9/19>>>no ascites Echo 9/19>>>EF 45-55% diffuse hypokinesis  LINES / TUBES: PIV  CULTURES: MRSA PCR 9/16: neg   ANTIBIOTICS: Unasyn 9/20  SUBJECTIVE:  Improved. Less agitation. Oxygenation better VITAL SIGNS: BP 108/68  Pulse 106  Temp(Src) 98.4 F (36.9 C) (Oral)  Resp 24  Ht 5\' 11"  (1.803 m)  Wt 72.5 kg (159 lb 13.3 oz)  BMI 22.3 kg/m2  SpO2 94% ON RA  HEMODYNAMICS: Bp better on 2.64mcg Dobutamine   VENTILATOR SETTINGS: Vent Mode:  [-]  FiO2 (%):  [50 %-75 %] 50 % INTAKE / OUTPUT: Intake/Output     09/20 0701 - 09/21 0700 09/21 0701 - 09/22 0700   P.O. 420    I.V. (mL/kg) 596.7 (8.2) 22.7 (0.3)   Other 1400    IV Piggyback 410    Total Intake(mL/kg) 2826.7 (39) 22.7 (0.3)   Urine (mL/kg/hr) 4050 (2.3) 70 (0.4)   Total Output 4050 70   Net -1223.3 -47.3        Stool Occurrence 2 x      PHYSICAL EXAMINATION: General:  Resting comfortably and less agitated  Neuro:  Rass 0, less agitation HEENT:  Cardiovascular:  RRR Lungs:  Improved BS Abdomen:  Firm, hypoactive + bowel sounds  Musculoskeletal:  intact Skin:  abrasion left knee   LABS:  CBC Recent Labs     01/16/13  0346  01/17/13  0338  01/18/13  0350  WBC  6.0  10.3  10.9*  HGB  11.7*  11.8*  11.5*  HCT  34.9*  34.9*  33.4*  PLT  125*  135*  154   Coag's Recent Labs     01/16/13  0346  01/18/13  0350  INR  2.11*  1.36   BMET Recent Labs     01/16/13   0346  01/17/13  0338  01/18/13  0350  NA  134*  135  136  K  4.9  3.8  2.4*  CL  100  104  99  CO2  25  24  28   BUN  <3*  3*  6  CREATININE  0.54  0.53  0.53  GLUCOSE  196*  130*  89   Electrolytes Recent Labs     01/16/13  0346  01/17/13  0338  01/18/13  0350  CALCIUM  8.1*  7.7*  7.8*  MG  1.6   --    --   PHOS  2.9   --    --    Sepsis Markers Recent Labs     01/16/13  1230  01/17/13  0338  01/18/13  0350  PROCALCITON  0.70  0.73  0.96   ABG No results found for this basename: PHART, PCO2ART, PO2ART,  in the last 72 hours  Liver Enzymes Recent Labs     01/16/13  0346  01/17/13  0338  01/18/13  0350  AST  252*  175*  177*  ALT  93*  73*  67*  ALKPHOS  183*  162*  152*  BILITOT  17.5*  18.8*  21.5*  ALBUMIN  2.0*  1.8*  1.8*   Cardiac Enzymes Recent Labs     01/16/13  1230  PROBNP  282.4*   Glucose Recent Labs     01/17/13  0341  01/17/13  0754  01/17/13  1218  01/17/13  1706  01/17/13  1959  01/17/13  2348  GLUCAP  136*  122*  115*  141*  101*  66*    Imaging US Abdomen Limited  01/16/2013   CLINICAL DATA:  History of pancreatitis. Question ascites.  EXAM: US ABDOMEN LIMITED - RIGHT UPPER QUADRANT  COMPARISON:  01/14/2013 ultrasound and 05/09/2012 CT  FINDINGS: Gallbladder:  Four-quadrant examination without evidence of ascites.  Diffusely echogenic liver consistent with fatty infiltration. Liver was not completely assessed on present exam.  IMPRESSION: No ascites detected.   Electronically Signed   By: Bridgett Larsson   On: 01/16/2013 15:00   Dg Chest Port 1 View  01/18/2013   CLINICAL DATA:  Pulmonary edema  EXAM: PORTABLE CHEST - 1 VIEW  COMPARISON:  01/17/2013  FINDINGS: Pulmonary vascular congestion with suspected mild residual interstitial edema, improved. No pleural effusion or pneumothorax.  The heart is normal in size.  IMPRESSION: Pulmonary vascular congestion with suspected mild residual interstitial edema, improved.   Electronically Signed    By: Charline Bills M.D.   On: 01/18/2013 07:15   Dg Chest Port 1 View  01/17/2013   *RADIOLOGY REPORT*  Clinical Data: Status post bilateral thoracentesis evaluate for pneumothorax.  PORTABLE CHEST - 1 VIEW  Comparison: January 16, 2013  Findings: The previously noted opacities of the lateral lungs have significantly decreased.  There is no significant pleural effusion. There are no pleural line is present to suggest pneumothorax.  The mediastinal contour and cardiac silhouette are normal.  There is mild atelectasis of left lung base.  The soft tissues and osseous structures are stable.  IMPRESSION: The previously noted opacities of the lateral lungs have significantly decreased.   There is no pleural effusion.  There is no pneumothorax.   Original Report Authenticated By: Sherian Rein, M.D.   Dg Chest Port 1 View  01/16/2013   CLINICAL DATA:  Seizure disorder. Liver problems.  EXAM: PORTABLE CHEST - 1 VIEW  COMPARISON:  01/13/2013.  FINDINGS: Significant change since the prior examination with bilateral pleural effusions and diffuse asymmetric airspace disease. This may represent pulmonary edema with pleural effusions although infectious infiltrate not excluded in the proper clinical setting.  Patient has a history of pancreatitis and pancreatitis contributing to the lung parenchyma changes not excluded.  No gross pneumothorax.  Heart size top normal to minimally enlarged.  IMPRESSION: Significant change since the prior examination with bilateral pleural effusions and diffuse asymmetric airspace disease. This may represent pulmonary edema with pleural effusions although infectious infiltrate not excluded in the proper clinical setting.  This is a call report.   Electronically Signed   By: Bridgett Larsson   On: 01/16/2013 12:02     CXR: 9/21 CXR: improved aeration, resolved effusions  ASSESSMENT / PLAN:  PULMONARY A:  Acute hypoxic resp failure in setting of bilateral pulmonary infiltrates.   pulm edema/pleural effusion in setting of ETOH CM, also ? Aspiration, improved 9/21 s/p thoracentesis   P:   Alb BDs Flutter Cont DBA drip 2.5 mcg  CARDIOVASCULAR A: Tachycardia in setting or ETOH withdrawal - improved with precedex  Mild hypotension--> Pulmonary  edema.  EF depressed on Echo 45-50% d/t ETOH  P:  Tele Cont Diurese  Cont dobutamine drip  2.56mcg  RENAL A:   No acute issue  P:   Cont Diuresis F/u chem  Reduce ivf to kvo  GASTROINTESTINAL A:   Acute alcoholic hepatitis  BILI rising  GI following.  P:   Steroids per GI F/u chem  Adv diet  HEMATOLOGIC A:  Mild Macrocytic anemia  Thrombocytopenia Hepatitis related coagulopathy  P:   Transfuse for hgb <7 F/u cbc    INFECTIOUS A: Poss asp pna PCT re-assuring  P:   Cont unasyn F/u c/s data  ENDOCRINE A:   Episodes of hypoglycemia P:   Trend cbg No ssi unless consistently >180   NEUROLOGIC A:   Acute encephalopathy Delirium tremens  Agitation is much better w/ precedex gtt , try lower dose Hx ETOH seizure d/o P:   Supportive care Off precedex Cont ativan Cont  folate/thiamine  Cont IV keppra    TODAY'S SUMMARY:  ETOH CM with pulm edema, pleural effusions, alc hepatitis, resp failure, DTs all better 9/21. Plan adv diet, cont low dose DBA, cont lasix, replete K, cont unasyn.  CC  Dorcas Carrow Beeper  [phone]  Cell  [phone]  If no response or cell goes to voicemail, call beeper [phone]   01/18/2013 9:19 AM

## 2013-01-19 ENCOUNTER — Inpatient Hospital Stay (HOSPITAL_COMMUNITY): Payer: BC Managed Care – PPO

## 2013-01-19 LAB — COMPREHENSIVE METABOLIC PANEL
Albumin: 2 g/dL — ABNORMAL LOW (ref 3.5–5.2)
Alkaline Phosphatase: 173 U/L — ABNORMAL HIGH (ref 39–117)
BUN: 8 mg/dL (ref 6–23)
CO2: 29 mEq/L (ref 19–32)
Chloride: 101 mEq/L (ref 96–112)
GFR calc Af Amer: >90 mL/min (ref >90)
GFR calc non Af Amer: >90 mL/min (ref >90)
Potassium: 2.6 mEq/L — CL (ref 3.5–5.1)
Sodium: 140 mEq/L (ref 135–145)
Total Bilirubin: 22.9 mg/dL (ref 0.3–1.2)

## 2013-01-19 LAB — BASIC METABOLIC PANEL
BUN: 7 mg/dL (ref 6–23)
Calcium: 8.3 mg/dL — ABNORMAL LOW (ref 8.4–10.5)
Creatinine, Ser: 0.55 mg/dL (ref 0.50–1.35)
GFR calc Af Amer: >90 mL/min (ref >90)
GFR calc non Af Amer: >90 mL/min (ref >90)
Glucose, Bld: 188 mg/dL — ABNORMAL HIGH (ref 70–99)
Potassium: 3.8 mEq/L (ref 3.5–5.1)

## 2013-01-19 LAB — CBC
HCT: 34.3 % — ABNORMAL LOW (ref 39.0–52.0)
Hemoglobin: 11.5 g/dL — ABNORMAL LOW (ref 13.0–17.0)
MCH: 36.1 pg — ABNORMAL HIGH (ref 26.0–34.0)
MCHC: 33.5 g/dL (ref 30.0–36.0)
RDW: 15.4 % (ref 11.5–15.5)
WBC: 9 10*3/uL (ref 4.0–10.5)

## 2013-01-19 LAB — PRO B NATRIURETIC PEPTIDE: Pro B Natriuretic peptide (BNP): 41.7 pg/mL (ref 0–125)

## 2013-01-19 LAB — MAGNESIUM: Magnesium: 1.4 mg/dL — ABNORMAL LOW (ref 1.5–2.5)

## 2013-01-19 LAB — GLUCOSE, CAPILLARY
Glucose-Capillary: 84 mg/dL (ref 70–99)
Glucose-Capillary: 169 mg/dL — ABNORMAL HIGH (ref 70–99)

## 2013-01-19 MED ORDER — POTASSIUM CHLORIDE CRYS ER 20 MEQ PO TBCR
40.0000 meq | EXTENDED_RELEASE_TABLET | ORAL | Status: AC
  Administered 2013-01-19 (×3): 40 meq via ORAL
  Filled 2013-01-19 (×3): qty 2

## 2013-01-19 MED ORDER — LEVETIRACETAM 500 MG PO TABS
1000.0000 mg | ORAL_TABLET | Freq: Two times a day (BID) | ORAL | Status: DC
  Administered 2013-01-19 – 2013-02-02 (×28): 1000 mg via ORAL
  Filled 2013-01-19 (×30): qty 2

## 2013-01-19 MED ORDER — ENSURE COMPLETE PO LIQD
237.0000 mL | Freq: Two times a day (BID) | ORAL | Status: DC
  Administered 2013-01-19 – 2013-01-26 (×10): 237 mL via ORAL

## 2013-01-19 MED ORDER — SODIUM CHLORIDE 0.9 % IV SOLN
6.0000 g | Freq: Once | INTRAVENOUS | Status: AC
  Administered 2013-01-19: 6 g via INTRAVENOUS
  Filled 2013-01-19: qty 12

## 2013-01-19 MED ORDER — PANTOPRAZOLE SODIUM 40 MG PO TBEC
40.0000 mg | DELAYED_RELEASE_TABLET | Freq: Two times a day (BID) | ORAL | Status: DC
  Administered 2013-01-19 – 2013-02-02 (×28): 40 mg via ORAL
  Filled 2013-01-19 (×32): qty 1

## 2013-01-19 MED ORDER — ALBUTEROL SULFATE (5 MG/ML) 0.5% IN NEBU: 2.5000 mg | INHALATION_SOLUTION | Freq: Four times a day (QID) | RESPIRATORY_TRACT | Status: DC | PRN

## 2013-01-19 NOTE — Progress Notes (Signed)
Subjective: Sleepy.  Objective: Vital signs in last 24 hours: Temp:  [98.8 F (37.1 C)-99.4 F (37.4 C)] 99.4 F (37.4 C) (09/22 0400) Pulse Rate:  [95-127] 102 (09/22 0400) Resp:  [18-36] 24 (09/22 0400) BP: (100-160)/(52-107) 122/83 mmHg (09/22 0400) SpO2:  [89 %-98 %] 96 % (09/22 0400) Last BM Date: 01/19/13  Intake/Output from previous day: 09/21 0701 - 09/22 0700 In: 842.1 [P.O.:560; I.V.:82.1; IV Piggyback:200] Out: 3000 [Urine:3000] Intake/Output this shift:    General appearance: NAD, ? confused versus medications and being awaken. GI: soft, hepatomegaly 10 cm below the costal margin  Lab Results:  Recent Labs  01/17/13 0338 01/18/13 0350 01/19/13 0350  WBC 10.3 10.9* 9.0  HGB 11.8* 11.5* 11.5*  HCT 34.9* 33.4* 34.3*  PLT 135* 154 158   BMET  Recent Labs  01/17/13 0338 01/18/13 0350 01/19/13 0350  NA 135 136 140  K 3.8 2.4* 2.6*  CL 104 99 101  CO2 24 28 29   GLUCOSE 130* 89 97  BUN 3* 6 8  CREATININE 0.53 0.53 0.56  CALCIUM 7.7* 7.8* 8.1*   LFT  Recent Labs  01/19/13 0350  PROT 4.8*  ALBUMIN 2.0*  AST 170*  ALT 65*  ALKPHOS 173*  BILITOT 22.9*   PT/INR  Recent Labs  01/18/13 0350  LABPROT 16.4*  INR 1.36   Hepatitis Panel No results found for this basename: HEPBSAG, HCVAB, HEPAIGM, HEPBIGM,  in the last 72 hours C-Diff No results found for this basename: CDIFFTOX,  in the last 72 hours Fecal Lactopherrin No results found for this basename: FECLLACTOFRN,  in the last 72 hours  Studies/Results: Dg Chest Port 1 View  01/19/2013   CLINICAL DATA:  Edema  EXAM: PORTABLE CHEST - 1 VIEW  COMPARISON:  01/18/2013  FINDINGS: Resolved interstitial opacities. Normal heart size. No effusion or pneumothorax.  IMPRESSION: Resolved pulmonary edema.   Electronically Signed   By: Tiburcio Pea   On: 01/19/2013 05:32   Dg Chest Port 1 View  01/18/2013   CLINICAL DATA:  Pulmonary edema  EXAM: PORTABLE CHEST - 1 VIEW  COMPARISON:  01/17/2013   FINDINGS: Pulmonary vascular congestion with suspected mild residual interstitial edema, improved. No pleural effusion or pneumothorax.  The heart is normal in size.  IMPRESSION: Pulmonary vascular congestion with suspected mild residual interstitial edema, improved.   Electronically Signed   By: Charline Bills M.D.   On: 01/18/2013 07:15   Dg Chest Port 1 View  01/17/2013   *RADIOLOGY REPORT*  Clinical Data: Status post bilateral thoracentesis evaluate for pneumothorax.  PORTABLE CHEST - 1 VIEW  Comparison: January 16, 2013  Findings: The previously noted opacities of the lateral lungs have significantly decreased.  There is no significant pleural effusion. There are no pleural line is present to suggest pneumothorax.  The mediastinal contour and cardiac silhouette are normal.  There is mild atelectasis of left lung base.  The soft tissues and osseous structures are stable.  IMPRESSION: The previously noted opacities of the lateral lungs have significantly decreased.   There is no pleural effusion.  There is no pneumothorax.   Original Report Authenticated By: Sherian Rein, M.D.    Medications:  Scheduled: . albuterol  2.5 mg Nebulization Q6H  . ampicillin-sulbactam (UNASYN) IV  1.5 g Intravenous Q6H  . antiseptic oral rinse  15 mL Mouth Rinse BID  . folic acid  1 mg Intravenous Daily   Or  . folic acid  1 mg Oral Daily  . levETIRAcetam  1,000 mg Intravenous Q12H  . LORazepam  1 mg Oral Q6H  . magnesium sulfate LVP 250-500 ml  6 g Intravenous Once  . methylPREDNISolone (SOLU-MEDROL) injection  40 mg Intravenous Q24H  . multivitamin with minerals  1 tablet Oral Daily  . pantoprazole (PROTONIX) IV  40 mg Intravenous BID  . potassium chloride  40 mEq Oral Q4H  . sodium chloride  3 mL Intravenous Q12H  . thiamine  100 mg Oral Daily   Or  . thiamine  100 mg Intravenous Daily   Continuous: . DOBUTamine 2.5 mcg/kg/min (01/18/13 1800)    Assessment/Plan: 1) ETOH hepatitis. 2) ETOH  withdrawal.   Clinically he appears to be improving, however, his bilirubin has increased.  FFP was administered for his increased INR, but no overt reports of bleeding.    Plan: 1) Continue with solumedrol. 2) Follow liver panel. 3) Please avoid giving any FFP or vitamin K if the patient is not overtly bleeding from an elevated INR.   LOS: 6 days   Keyna Blizard D 01/19/2013, 8:10 AM

## 2013-01-19 NOTE — Progress Notes (Signed)
CARE MANAGEMENT NOTE 01/19/2013  Patient:  Eric Mueller, Eric Mueller   Account Number:  [id number]  Date Initiated:  01/14/2013  Documentation initiated by:  DAVIS,RHONDA  Subjective/Objective Assessment:   pt with hx of etoh abuse and seizure.     Action/Plan:   lives with parent.  home when stable   Anticipated DC Date:  01/22/2013   Anticipated DC Plan:  HOME/SELF CARE  In-house referral  NA      DC Planning Services  NA      [place] Choice  NA   Choice offered to / List presented to:  NA   DME arranged  NA      DME agency  NA     HH arranged  NA      HH agency  NA   Status of service:  In process, will continue to follow Medicare Important Message given?  NA - LOS <3 / Initial given by admissions (If response is "NO", the following Medicare IM given date fields will be blank) Date Medicare IM given:   Date Additional Medicare IM given:    Discharge Disposition:    Per UR Regulation:  Reviewed for med. necessity/level of care/duration of stay  If discussed at Long Length of Stay Meetings, dates discussed:    Comments:  [id number]/Rhonda Stark Jock, BSN, [place] [phone] Chart Reviewed for discharge and hospital needs. Discharge needs at time of review:  None patient was not transferred as planned due to hypotensive episodes.  iv doubutamine infusing. Review of patient progress due on [id number].   [id number] Earlene Plater RN, BSN, [place] [phone] Chart Reviewed for discharge and hospital needs. patient being transferred from sdu to tele on 5 east. Discharge needs at time of review:  None Review of patient progress due on [id number].

## 2013-01-19 NOTE — Progress Notes (Signed)
PULMONARY  / CRITICAL CARE MEDICINE  Name: Eric Mueller MRN: [id number] DOB: [birth year]    ADMISSION DATE:  01/13/2013 CONSULTATION DATE:  9/18  REFERRING MD :  Suanne Marker PRIMARY SERVICE:  TRH CHIEF COMPLAINT:  DTs/acute encephalopathy   BRIEF PATIENT DESCRIPTION: 25 y/om admitted 9/16 w/ acute alcoholic hepatitis. PCCM asked to see on 9/19 for progressive acute encephalopathy/ DTs.   SIGNIFICANT EVENTS / STUDIES:  9/16 - [place] abd/pelvis>>> Portal and hepatic veins are patent with normal flow. Hepatomegally, GB sludge.  9/19 - [place] abd>>>no ascites 9/19 - Echo>>>EF 45-55% diffuse hypokinesis  LINES / TUBES: PIV  CULTURES: MRSA PCR 9/16>>neg  Pleural Fluid Culture 9/20>>>  ANTIBIOTICS: Unasyn 9/20>>>9/22  SUBJECTIVE: Pt denies anxiety, 'need' for drink.   VITAL SIGNS: BP 122/83  Pulse 102  Temp(Src) 99.1 F (37.3 C) (Oral)  Resp 24  Ht 5\' 11"  (1.803 m)  Wt 159 lb 13.3 oz (72.5 kg)  BMI 22.3 kg/m2  SpO2 96% ON RA  INTAKE / OUTPUT: Intake/Output     09/21 0701 - 09/22 0700 09/22 0701 - 09/23 0700   P.O. 560    I.V. (mL/kg) 82.1 (1.1)    Other     IV Piggyback 200    Total Intake(mL/kg) 842.1 (11.6)    Urine (mL/kg/hr) 3000 (1.7)    Total Output 3000     Net -2157.9          Stool Occurrence 2 x     PHYSICAL EXAMINATION: General:  Resting comfortably, no agitation  Neuro:  Rass 0, awake, alert, tremor noted HEENT:  Cardiovascular:  RRR Lungs:  Improved BS Abdomen:  Firm, hypoactive + bowel sounds  Musculoskeletal:  intact Skin:  abrasion left knee   LABS:  CBC Recent Labs     01/17/13  0338  01/18/13  0350  01/19/13  0350  WBC  10.3  10.9*  9.0  HGB  11.8*  11.5*  11.5*  HCT  34.9*  33.4*  34.3*  PLT  135*  154  158   Coag's Recent Labs     01/18/13  0350  INR  1.36   BMET Recent Labs     01/17/13  0338  01/18/13  0350  01/19/13  0350  NA  135  136  140  K  3.8  2.4*  2.6*  CL  104  99  101  CO2  24  28  29   BUN  3*  6  8   CREATININE  0.53  0.53  0.56  GLUCOSE  130*  89  97   Electrolytes Recent Labs     01/17/13  0338  01/18/13  0350  01/19/13  0350  CALCIUM  7.7*  7.8*  8.1*  MG   --    --   1.4*  PHOS   --    --   2.7   Sepsis Markers Recent Labs     01/16/13  1230  01/17/13  0338  01/18/13  0350  PROCALCITON  0.70  0.73  0.96   ABG No results found for this basename: PHART, PCO2ART, PO2ART,  in the last 72 hours  Liver Enzymes Recent Labs     01/17/13  0338  01/18/13  0350  01/19/13  0350  AST  175*  177*  170*  ALT  73*  67*  65*  ALKPHOS  162*  152*  173*  BILITOT  18.8*  21.5*  22.9*  ALBUMIN  1.8*  1.8*  2.0*   Cardiac Enzymes Recent Labs     01/16/13  1230  01/19/13  0350  PROBNP  282.4*  41.7   Glucose Recent Labs     01/18/13  0842  01/18/13  1213  01/18/13  1634  01/18/13  2015  01/18/13  2353  01/19/13  0743  GLUCAP  87  115*  141*  103*  103*  84    Imaging Dg Chest Port 1 View  01/19/2013   CLINICAL DATA:  Edema  EXAM: PORTABLE CHEST - 1 VIEW  COMPARISON:  01/18/2013  FINDINGS: Resolved interstitial opacities. Normal heart size. No effusion or pneumothorax.  IMPRESSION: Resolved pulmonary edema.   Electronically Signed   By: Tiburcio Pea   On: 01/19/2013 05:32   Dg Chest Port 1 View  01/18/2013   CLINICAL DATA:  Pulmonary edema  EXAM: PORTABLE CHEST - 1 VIEW  COMPARISON:  01/17/2013  FINDINGS: Pulmonary vascular congestion with suspected mild residual interstitial edema, improved. No pleural effusion or pneumothorax.  The heart is normal in size.  IMPRESSION: Pulmonary vascular congestion with suspected mild residual interstitial edema, improved.   Electronically Signed   By: Charline Bills M.D.   On: 01/18/2013 07:15   Dg Chest Port 1 View  01/17/2013   *RADIOLOGY REPORT*  Clinical Data: Status post bilateral thoracentesis evaluate for pneumothorax.  PORTABLE CHEST - 1 VIEW  Comparison: January 16, 2013  Findings: The previously noted opacities of  the lateral lungs have significantly decreased.  There is no significant pleural effusion. There are no pleural line is present to suggest pneumothorax.  The mediastinal contour and cardiac silhouette are normal.  There is mild atelectasis of left lung base.  The soft tissues and osseous structures are stable.  IMPRESSION: The previously noted opacities of the lateral lungs have significantly decreased.   There is no pleural effusion.  There is no pneumothorax.   Original Report Authenticated By: Sherian Rein, M.D.    ASSESSMENT / PLAN:  PULMONARY A:  Acute hypoxic resp failure - in setting of bilateral pulmonary infiltrates likely from pulmonary edema and pleural effusions from ETOH cardiomyopathy >> much improved.  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% P:  Monitor hemodynamics D/c dobutamine 9/22 Negative fluid balance  RENAL A:   Hypokalemia Hypomagnesemia  P:   F/u and replace electrolytes as needed  GASTROINTESTINAL A:   Acute alcoholic hepatitis P:   Steroids per GI F/u LFT's Continue regular diet with supplemental Ensure  HEMATOLOGIC A:  Mild Macrocytic anemia  Thrombocytopenia Hepatitis related coagulopathy - s/p FFP for throacentesis P:  Transfuse for hgb <7 F/u cbc   Hold further tx of FFP unless active bleeding (was for impending procedure)  INFECTIOUS A:  Initial concern for aspiration pneumonia >> rapid improvement in oxygenation and pulmonary infiltrates with diuresis speaks against infection.  Also procalcitonin unremarkable. P:   D/c unasyn and monitor clinically  ENDOCRINE A:   Episodes of hypoglycemia P:   Trend cbg No ssi unless consistently >180 given liver issues  NEUROLOGIC A:   Acute encephalopathy 2nd to Delirium  tremens, hypoxia, hepatitis - improved  Hx ETOH seizure d/o P:   Supportive care Continue ativan Continue  folate/thiamine  Continue keppra  Change to SDU status.   Canary Brim, NP-C  [facility] Pgr: [phone] or [phone]  01/19/2013 9:37 AM  Reviewed above, and examined pt.  Mental status slowly improving.  Will monitor off dobutamine.  Will need f/u Echo at some point, and will likely need cardiology assessment at some point also >> defer for now.  Updated pt's mother at bedside.  Coralyn Helling, MD [facility] Pulmonary/Critical Care 01/19/2013, 12:05 PM Pager:  [phone] After 3pm call: [phone]

## 2013-01-19 NOTE — Progress Notes (Signed)
[facility] Nursing ICU Electrolyte Replacement Protocol  Patient Name: Eric Mueller DOB: May 04, 1985 MRN: [id number]  Date of Service  01/19/2013   HPI/Events of Note    Recent Labs Lab 01/13/13 1949 01/14/13 0330 01/15/13 0439 01/16/13 0346 01/17/13 0338 01/18/13 0350 01/19/13 0350  NA 136 134* 137 134* 135 136 140  K 2.6* 2.7* 3.2* 4.9 3.8 2.4* 2.6*  CL 89* 94* 100 100 104 99 101  CO2 27 29 26 25 24 28 29   GLUCOSE 109* 79 68* 196* 130* 89 97  BUN 3* <3* <3* <3* 3* 6 8  CREATININE 0.48* 0.54 0.52 0.54 0.53 0.53 0.56  CALCIUM 8.7 7.4* 7.9* 8.1* 7.7* 7.8* 8.1*  MG 1.3* 1.8  --  1.6  --   --  1.4*  PHOS  --   --   --  2.9  --   --  2.7    Estimated Creatinine Clearance: 141 ml/min (by C-G formula based on Cr of 0.56).  Intake/Output     09/21 0701 - 09/22 0700   P.O. 560   I.V. (mL/kg) 79.4 (1.1)   IV Piggyback 200   Total Intake(mL/kg) 839.4 (11.6)   Urine (mL/kg/hr) 3000 (1.7)   Total Output 3000   Net -2160.6       Stool Occurrence 1 x    - I/O DETAILED x24h    Total I/O In: 27 [I.V.:27] Out: -  - I/O THIS SHIFT    ASSESSMENT   eICURN Interventions  K+ 2.6  Mg 1.4 Electrolyte protocol criteria met.  Replace per protocol.  MD notified   ASSESSMENT: MAJOR ELECTROLYTE    Merita Norton 01/19/2013, 5:25 AM

## 2013-01-20 LAB — COMPREHENSIVE METABOLIC PANEL
AST: 173 U/L — ABNORMAL HIGH (ref 0–37)
BUN: 7 mg/dL (ref 6–23)
CO2: 29 mEq/L (ref 19–32)
Chloride: 101 mEq/L (ref 96–112)
Creatinine, Ser: 0.52 mg/dL (ref 0.50–1.35)
GFR calc non Af Amer: >90 mL/min (ref >90)
Glucose, Bld: 86 mg/dL (ref 70–99)
Total Bilirubin: 24.4 mg/dL (ref 0.3–1.2)
Total Protein: 5.1 g/dL — ABNORMAL LOW (ref 6.0–8.3)

## 2013-01-20 LAB — GLUCOSE, CAPILLARY
Glucose-Capillary: 129 mg/dL — ABNORMAL HIGH (ref 70–99)
Glucose-Capillary: 77 mg/dL (ref 70–99)
Glucose-Capillary: 82 mg/dL (ref 70–99)
Glucose-Capillary: 84 mg/dL (ref 70–99)

## 2013-01-20 LAB — CBC
HCT: 34.3 % — ABNORMAL LOW (ref 39.0–52.0)
Hemoglobin: 11.5 g/dL — ABNORMAL LOW (ref 13.0–17.0)
MCV: 108.5 fL — ABNORMAL HIGH (ref 78.0–100.0)
RBC: 3.16 MIL/uL — ABNORMAL LOW (ref 4.22–5.81)
WBC: 8.2 10*3/uL (ref 4.0–10.5)

## 2013-01-20 MED ORDER — LORAZEPAM 1 MG PO TABS
1.0000 mg | ORAL_TABLET | Freq: Three times a day (TID) | ORAL | Status: DC
  Administered 2013-01-20 – 2013-01-25 (×14): 1 mg via ORAL
  Filled 2013-01-20 (×14): qty 1

## 2013-01-20 NOTE — Progress Notes (Signed)
Report called to Tressie Stalker, RN, on 4E. Pt aware of transfer to room 1414 and agreeable. Pt transported in w/c by NT in stable condition.

## 2013-01-20 NOTE — Progress Notes (Signed)
Subjective: Feeling well.  No complaints at this time, but I just woke him up.  Objective: Vital signs in last 24 hours: Temp:  [98.5 F (36.9 C)-99.1 F (37.3 C)] 98.8 F (37.1 C) (09/23 0400) Pulse Rate:  [71-96] 79 (09/23 0600) Resp:  [21-30] 23 (09/23 0600) BP: (90-125)/(56-85) 90/56 mmHg (09/23 0600) SpO2:  [86 %-100 %] 93 % (09/23 0600) Weight:  [145 lb 8.1 oz (66 kg)] 145 lb 8.1 oz (66 kg) (09/23 0500) Last BM Date: 01/19/13  Intake/Output from previous day: 09/22 0701 - 09/23 0700 In: 1468.5 [P.O.:650; I.V.:98.5; IV Piggyback:50] Out: 1411 [Urine:1410; Stool:1] Intake/Output this shift:    General appearance: alert and no distress GI: soft, hepatomegaly  Lab Results:  Recent Labs  01/18/13 0350 01/19/13 0350 01/20/13 0325  WBC 10.9* 9.0 8.2  HGB 11.5* 11.5* 11.5*  HCT 33.4* 34.3* 34.3*  PLT 154 158 174   BMET  Recent Labs  01/19/13 0350 01/19/13 1546 01/20/13 0325  NA 140 138 138  K 2.6* 3.8 3.5  CL 101 99 101  CO2 29 28 29   GLUCOSE 97 188* 86  BUN 8 7 7   CREATININE 0.56 0.55 0.52  CALCIUM 8.1* 8.3* 8.4   LFT  Recent Labs  01/20/13 0325  PROT 5.1*  ALBUMIN 2.0*  AST 173*  ALT 65*  ALKPHOS 158*  BILITOT 24.4*   PT/INR  Recent Labs  01/18/13 0350  LABPROT 16.4*  INR 1.36   Hepatitis Panel No results found for this basename: HEPBSAG, HCVAB, HEPAIGM, HEPBIGM,  in the last 72 hours C-Diff No results found for this basename: CDIFFTOX,  in the last 72 hours Fecal Lactopherrin No results found for this basename: FECLLACTOFRN,  in the last 72 hours  Studies/Results: Dg Chest Port 1 View  01/19/2013   CLINICAL DATA:  Edema  EXAM: PORTABLE CHEST - 1 VIEW  COMPARISON:  01/18/2013  FINDINGS: Resolved interstitial opacities. Normal heart size. No effusion or pneumothorax.  IMPRESSION: Resolved pulmonary edema.   Electronically Signed   By: Tiburcio Pea   On: 01/19/2013 05:32    Medications:  Scheduled: . antiseptic oral rinse  15 mL  Mouth Rinse BID  . feeding supplement  237 mL Oral BID BM  . folic acid  1 mg Intravenous Daily   Or  . folic acid  1 mg Oral Daily  . levETIRAcetam  1,000 mg Oral BID  . LORazepam  1 mg Oral Q6H  . methylPREDNISolone (SOLU-MEDROL) injection  40 mg Intravenous Q24H  . multivitamin with minerals  1 tablet Oral Daily  . pantoprazole  40 mg Oral BID  . sodium chloride  3 mL Intravenous Q12H  . thiamine  100 mg Oral Daily   Or  . thiamine  100 mg Intravenous Daily   Continuous:   Assessment/Plan: 1) ETOH hepatitis. 2) ETOH withdrawal. 3) ETOH abuse.   His bili continues to increase.  Hopefully it will peak soon and then decline.  No clear evidence of hepatic encephalopathy.  He is on prednisolone at this time.  Plan: 1) Continue with prednisolone at 40 mg QD x 28 days and then taper off. 2) Continue with supportive care.   LOS: 7 days   Dezirea Mccollister D 01/20/2013, 7:34 AM

## 2013-01-20 NOTE — Progress Notes (Signed)
PULMONARY  / CRITICAL CARE MEDICINE  Name: Eric Mueller MRN: [id number] DOB: [birth year]    ADMISSION DATE:  01/13/2013 CONSULTATION DATE:  9/18  REFERRING MD :  Suanne Marker PRIMARY SERVICE:  TRH CHIEF COMPLAINT:  DTs/acute encephalopathy   BRIEF PATIENT DESCRIPTION: 35 y/om admitted 9/16 w/ acute alcoholic hepatitis. PCCM asked to see on 9/19 for progressive acute encephalopathy/ DTs.   SIGNIFICANT EVENTS / STUDIES:  9/16 - [place] abd/pelvis>>> Portal and hepatic veins are patent with normal flow. Hepatomegally, GB sludge.  9/19 - [place] abd>>>no ascites 9/19 - Echo>>>EF 45-55% diffuse hypokinesis  LINES / TUBES: PIV  CULTURES: MRSA PCR 9/16>>neg  Pleural Fluid Culture 9/20>>>  ANTIBIOTICS: Unasyn 9/20>>>9/22  SUBJECTIVE:  Slept well.  Denies chest/abd pain.  Tolerating diet.  VITAL SIGNS: BP 90/56  Pulse 79  Temp(Src) 98.8 F (37.1 C) (Oral)  Resp 23  Ht 5\' 11"  (1.803 m)  Wt 145 lb 8.1 oz (66 kg)  BMI 20.3 kg/m2  SpO2 93% ON RA  INTAKE / OUTPUT: Intake/Output     09/22 0701 - 09/23 0700 09/23 0701 - 09/24 0700   P.O. 650    I.V. (mL/kg) 98.5 (1.5)    Other 670    IV Piggyback 50    Total Intake(mL/kg) 1468.5 (22.3)    Urine (mL/kg/hr) 1410 (0.9)    Stool 1 (0)    Total Output 1411     Net +57.5           PHYSICAL EXAMINATION: General:  Resting comfortably, no agitation  Neuro: mild tremor, alert, follows commands, calm HEENT: scleral icterus Cardiac: regular, no murmur Lungs: no wheeze Abdomen: soft, non tender, + bowel sounds  Musculoskeletal: no edema Skin: no rashes  LABS:  CBC Recent Labs     01/18/13  0350  01/19/13  0350  01/20/13  0325  WBC  10.9*  9.0  8.2  HGB  11.5*  11.5*  11.5*  HCT  33.4*  34.3*  34.3*  PLT  154  158  174   Coag's Recent Labs     01/18/13  0350  INR  1.36   BMET Recent Labs     01/19/13  0350  01/19/13  1546  01/20/13  0325  NA  140  138  138  K  2.6*  3.8  3.5  CL  101  99  101  CO2  29  28  29   BUN   8  7  7   CREATININE  0.56  0.55  0.52  GLUCOSE  97  188*  86   Electrolytes Recent Labs     01/19/13  0350  01/19/13  1546  01/20/13  0325  CALCIUM  8.1*  8.3*  8.4  MG  1.4*  2.3  2.1  PHOS  2.7   --    --    Liver Enzymes Recent Labs     01/18/13  0350  01/19/13  0350  01/20/13  0325  AST  177*  170*  173*  ALT  67*  65*  65*  ALKPHOS  152*  173*  158*  BILITOT  21.5*  22.9*  24.4*  ALBUMIN  1.8*  2.0*  2.0*   Cardiac Enzymes Recent Labs     01/19/13  0350  PROBNP  41.7   Glucose Recent Labs     01/19/13  0743  01/19/13  1213  01/19/13  1545  01/19/13  1933  01/20/13  0009  01/20/13  0308  GLUCAP  84  163*  172*  169*  82  84    Imaging Dg Chest Port 1 View  01/19/2013   CLINICAL DATA:  Edema  EXAM: PORTABLE CHEST - 1 VIEW  COMPARISON:  01/18/2013  FINDINGS: Resolved interstitial opacities. Normal heart size. No effusion or pneumothorax.  IMPRESSION: Resolved pulmonary edema.   Electronically Signed   By: Tiburcio Pea   On: 01/19/2013 05:32    ASSESSMENT / PLAN:  PULMONARY A:  Acute hypoxic resp failure - in setting of bilateral pulmonary infiltrates likely from pulmonary edema and pleural effusions from ETOH cardiomyopathy >> much improved.  P:   PRN BD's Pulmonary hygiene F/u CXR as needed  CARDIOVASCULAR A: Tachycardia - in setting or ETOH withdrawal, improved Presumed alcohol induced cardiomyopathy - EF depressed on Echo 45-50% P:  Monitor hemodynamics Even to negative fluid balance Will need repeat Echo at some point, and then decide if cardiology evaluation needed  RENAL A:   Hypokalemia Hypomagnesemia  P:   F/u and replace electrolytes as needed  GASTROINTESTINAL A:   Acute alcoholic hepatitis P:   Steroids per GI F/u LFT's per GI Continue regular diet with supplemental Ensure  HEMATOLOGIC A:  Macrocytic anemia 2nd to ETOH Hepatitis related coagulopathy - s/p FFP for throacentesis P:  Transfuse for hgb <7 F/u cbc    Hold further tx of FFP unless active bleeding (was for impending procedure)  INFECTIOUS A:  Initial concern for aspiration pneumonia >> rapid improvement in oxygenation and pulmonary infiltrates with diuresis speaks against infection.  Also procalcitonin unremarkable. P:   Monitor off Abx  ENDOCRINE A:   Episodes of hypoglycemia P:   Trend cbg No ssi unless consistently >180 given liver issues  NEUROLOGIC A:   Acute encephalopathy 2nd to Delirium tremens, hypoxia, hepatitis - improved  Hx ETOH seizure d/o P:   Supportive care Change scheduled ativan to 1 mg q8h Continue prn ativan Continue  folate/thiamine  Continue keppra  Transfer to telemetry.  Transfer to [facility] for 9/24 and PCCM sign off.  Coralyn Helling, MD [facility] Pulmonary/Critical Care 01/20/2013, 7:47 AM Pager:  [phone] After 3pm call: [phone]

## 2013-01-20 NOTE — Progress Notes (Signed)
Clinical Social Work  CSW met with patient at bedside and introduced myself again. Patient reports he does not remember meeting with CSW last week. CSW spoke with patient regarding DC plans and if he had created a plan to remain sober. Patient reports he is unaware of services but is interested in treatment.  CSW provided patient with substance abuse treatment list and explained services such as inpatient, intensive outpatient, outpatient, and AA meetings. CSW provided patient with literature from [facility], [facility], and [facility] Out[facility] services. Patient reports he wants to review information today and is agreeable for CSW to follow up tomorrow. CSW explained that if patient was agreeable then CSW could assist with setting up follow up appointments at DC.  CSW will continue to follow.  [place], [place] [phone]

## 2013-01-21 LAB — COMPREHENSIVE METABOLIC PANEL
ALT: 68 U/L — ABNORMAL HIGH (ref 0–53)
BUN: 7 mg/dL (ref 6–23)
Calcium: 8.6 mg/dL (ref 8.4–10.5)
Creatinine, Ser: 0.61 mg/dL (ref 0.50–1.35)
GFR calc Af Amer: >90 mL/min (ref >90)
Glucose, Bld: 79 mg/dL (ref 70–99)
Sodium: 138 mEq/L (ref 135–145)
Total Protein: 5.2 g/dL — ABNORMAL LOW (ref 6.0–8.3)

## 2013-01-21 LAB — CBC
HCT: 33.9 % — ABNORMAL LOW (ref 39.0–52.0)
Hemoglobin: 11.3 g/dL — ABNORMAL LOW (ref 13.0–17.0)
MCHC: 33.3 g/dL (ref 30.0–36.0)
MCV: 109 fL — ABNORMAL HIGH (ref 78.0–100.0)
RBC: 3.11 MIL/uL — ABNORMAL LOW (ref 4.22–5.81)
WBC: 7.9 10*3/uL (ref 4.0–10.5)

## 2013-01-21 LAB — BODY FLUID CULTURE
Culture: NO GROWTH
Special Requests: NORMAL

## 2013-01-21 MED ORDER — POTASSIUM CHLORIDE CRYS ER 20 MEQ PO TBCR
40.0000 meq | EXTENDED_RELEASE_TABLET | Freq: Once | ORAL | Status: DC
  Filled 2013-01-21: qty 2

## 2013-01-21 NOTE — Care Management Note (Addendum)
    Page 1 of 2   02/02/2013     4:24:39 PM   CARE MANAGEMENT NOTE 02/02/2013  Patient:  Eric Mueller   Account Number:  [id number]  Date Initiated:  01/14/2013  Documentation initiated by:  DAVIS,RHONDA  Subjective/Objective Assessment:   pt with hx of etoh abuse and seizure.     Action/Plan:   lives with parent.  home when stable   Anticipated DC Date:  02/02/2013   Anticipated DC Plan:  HOME/SELF CARE  In-house referral  NA      DC Planning Services  CM consult      PAC Choice  NA   Choice offered to / List presented to:  NA   DME arranged  NA      DME agency  NA     HH arranged  NA      HH agency  NA   Status of service:  Completed, signed off Medicare Important Message given?  NA - LOS <3 / Initial given by admissions (If response is "NO", the following Medicare IM given date fields will be blank) Date Medicare IM given:   Date Additional Medicare IM given:    Discharge Disposition:  HOME/SELF CARE  Per UR Regulation:  Reviewed for med. necessity/level of care/duration of stay  If discussed at Long Length of Stay Meetings, dates discussed:   01/22/2013  01/27/2013  01/29/2013    Comments:  02/02/13 Wasim Hurlbut RN,BSN NCM [phone] D/C HOME.  01/29/13 Sheanna Dail RN,BSN NCM [phone] PER MD NOTE AWAITING GI FOR CLEARANCE FOR POSSIBLE D/C.NO ANTICIPATED D/C NEEDS.  01/27/13 Finneus Kaneshiro RN,BSN NCM [phone] PATIENT STATES NO N/V/ABD PAIN,APPETITE BETTER.RD FOLLOWING-IMPROVED PO INTAKE.BILI STILL RISING.GI FOLLOWING.IV SOLUMEDROL.CIWA PROTOCAL.NOT APPROPRIATE FOR LTAC.CONTINUE TO MONITOR FOR PROGRESS.PSYCH SW FOLLOWING-OFFERING REHAB-ETOH RESOURCES-PATIENT NOT READY YET.D/C PLAN HOME  01/26/13 Remington Highbaugh RN,BSN NCM [phone] ETOH HEPATITIS,CHF,ETOH CARDIOMYOPATHY.LFT'S STILL RISING.GI FOLLOWING.RD FOLLOWING-SUPPLEMENTS..IV SOLUMEDROL,CIWA PROTOCAL.D/C PLAN HOME.  01/21/13 Maquita Sandoval RN,BSN NCM [phone] TRANSFER FROM SDU.ETOH W/DRAWAL,ELEVATED  LFT'S,CHF.IV ATIVAN,IV SOLUMEDROL.PSYCH SW-FOLLOWING FOR [facility] RESOURCES.  [id number] Earlene Plater RN, BSN, [place] [phone] Chart Reviewed for discharge and hospital needs. Discharge needs at time of review:  None patient was not transferred as planned due to hypotensive episodes.  iv doubutamine infusing. Review of patient progress due on [id number].   [id number] Earlene Plater RN, BSN, [place] [phone] Chart Reviewed for discharge and hospital needs. patient being transferred from sdu to tele on 5 east. Discharge needs at time of review:  None Review of patient progress due on [id number].

## 2013-01-21 NOTE — Progress Notes (Signed)
Unassigned patient Subjective: Since I last evaluated the patient, his jaundice seems to have worsened. He denies having any abdominal but did vomit after having a small meal this afternoon. As per his nurse, he is not eating much. He presently has a friend in the room with him [I was told they were in college together]. His friend has several questions about the severity of Mr. [place] alcoholic liver disease.   Objective: Vital signs in last 24 hours: Temp:  [98.6 F (37 C)-99.7 F (37.6 C)] 99 F (37.2 C) (09/24 1305) Pulse Rate:  [72-86] 86 (09/24 1400) Resp:  [20-24] 20 (09/24 1305) BP: (104-115)/(64-78) 110/66 mmHg (09/24 1400) SpO2:  [97 %-98 %] 97 % (09/24 1305) Weight:  [65 kg (143 lb 4.8 oz)] 65 kg (143 lb 4.8 oz) (09/24 0500) Last BM Date: 01/20/13  Intake/Output from previous day: 09/23 0701 - 09/24 0700 In: 600 [P.O.:600] Out: 951 [Urine:950; Emesis/NG output:1] Intake/Output this shift: Total I/O In: 240 [P.O.:240] Out: 426 [Urine:425; Emesis/NG output:1]  General appearance: alert, cooperative, appears older than stated age, icteric and no distress Resp: clear to auscultation bilaterally Cardio: regular rate and rhythm, S1, S2 normal, no murmur, click, rub or gallop GI: soft, non-tender; bowel sounds normal; no masses,  no organomegaly Extremities: extremities normal, atraumatic, no cyanosis or edema  Lab Results:  Recent Labs  01/19/13 0350 01/20/13 0325 01/21/13 0428  WBC 9.0 8.2 7.9  HGB 11.5* 11.5* 11.3*  HCT 34.3* 34.3* 33.9*  PLT 158 174 174   BMET  Recent Labs  01/19/13 1546 01/20/13 0325 01/21/13 0428  NA 138 138 138  K 3.8 3.5 3.1*  CL 99 101 100  CO2 28 29 29   GLUCOSE 188* 86 79  BUN 7 7 7   CREATININE 0.55 0.52 0.61  CALCIUM 8.3* 8.4 8.6   LFT  Recent Labs  01/21/13 0428  PROT 5.2*  ALBUMIN 2.0*  AST 184*  ALT 68*  ALKPHOS 146*  BILITOT 25.8*   Medications: I have reviewed the patient's current  medications.  Assessment/Plan: Acute alcoholic hepatitis with coagulopathy, thrombocytopenia, with a TB of 25.8; this is worrisome as his bilirubin continues to increase everyday. Continue supportive care for now. I have counseled the patient about active involvement in a rehab program. Nutrition consult has been requested as well.   LOS: 8 days   Destanie Tibbetts 01/21/2013, 5:52 PM

## 2013-01-21 NOTE — Progress Notes (Signed)
Clinical Social Work  CSW met with patient at bedside. Patient was sitting in bed and watching TV when CSW arrived. Patient reports he is tired this morning but agreeable to session.  CSW inquired about the substance abuse information provided yesterday for patient to review. Patient reports he has been talking with family and is unsure about plans for rehab at DC. Patient is still undecided on inpatient treatment vs outpatient treatment and wants more time to review information. CSW explained that several facilities have waiting lists for inpatient treatment and that CSW could assist with scheduling appointment if patient desires. Patient is not ready to make a decision and requests more time. CSW explained that DC could not be delayed if placement was the only barrier. CSW explained that CSW could assist with creating a plan to remain sober until treatment is available if needed.  CSW inquired about patient's mood. Patient had minimal eye contact and flat affect. Patient reports he has not been sleeping well and feels tired. Patient desires any depression but reports feeling overwhelmed with hospital stay.  CSW will continue to follow and will assist as needed. Patient has CSW contact information if further needs arise.  [place], [place] [phone]

## 2013-01-21 NOTE — Progress Notes (Signed)
[facility] PROGRESS NOTE  BAO BAZEN ZOX:[id number] DOB: [birth year] DOA: 01/13/2013 PCP: Lucilla Edin, MD  HPI: Eric Mueller is a 27 y.o. male has a past medical history significant for alcohol abuse, with a prior hospitalization in January of 2014 for acute pancreatitis, another hospitalization in July for alcohol withdrawal seizures, presents to the emergency room with a chief complaint of yellowing of his eyes, fatigue, easy bruising, progressively over the past 2 weeks. Patient was admitted with alcoholic hepatitis and subsequently admitted to ICU for progressive acute encephalopathy/DTs. Mental status slowly improved and transferred to floor 01/20/2013.  Assessment/Plan:  Acute on chronic liver injury/alcoholic hepatitis  - T bili continues to increase, AST/ALT stable  - continue steroids - Doppler ultrasound of liver show portal and hepatic veins patent and with normal flow  Seizure -continue keppra.  -remains Seizure free  Thrombocytopenia  - due to chronic liver disease - improved. 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 No bleeding.  ETOH abuse/Severe DTs  - resolved  DVT Prophylaxis - SCDs.  Code Status: Full Family Communication: mother bedside  Disposition Plan: inpatient  Consultants:  GI  SIGNIFICANT EVENTS / STUDIES:  9/16 - [place] abd/pelvis>>> Portal and hepatic veins are patent with normal flow. Hepatomegally, GB sludge.  9/19 - [place] abd>>>no ascites  9/19 - Echo>>>EF 45-55% diffuse hypokinesis  CULTURES:  MRSA PCR 9/16>>neg  Pleural Fluid Culture 9/20>>>   ANTIBIOTICS:  Unasyn 9/20>>>9/22  Anti-infectives   Start     Dose/Rate Route Frequency Ordered Stop   01/17/13 0900  ampicillin-sulbactam (UNASYN) 1.5 g in sodium chloride 0.9 % 50 mL IVPB  Status:  Discontinued     1.5  g 100 mL/hr over 30 Minutes Intravenous Every 6 hours 01/17/13 0819 01/19/13 1208   01/13/13 2215  cefTRIAXone (ROCEPHIN) 1 g in dextrose 5 % 50 mL IVPB     1 g 100 mL/hr over 30 Minutes Intravenous  Once 01/13/13 2209 01/14/13 0023     Antibiotics Given (last 72 hours)   Date/Time Action Medication Dose Rate   01/18/13 1525 Given   ampicillin-sulbactam (UNASYN) 1.5 g in sodium chloride 0.9 % 50 mL IVPB 1.5 g 100 mL/hr   01/18/13 2030 Given   ampicillin-sulbactam (UNASYN) 1.5 g in sodium chloride 0.9 % 50 mL IVPB 1.5 g 100 mL/hr   01/19/13 0200 Given   ampicillin-sulbactam (UNASYN) 1.5 g in sodium chloride 0.9 % 50 mL IVPB 1.5 g 100 mL/hr   01/19/13 1001 Given   ampicillin-sulbactam (UNASYN) 1.5 g in sodium chloride 0.9 % 50 mL IVPB 1.5 g 100 mL/hr     HPI/Subjective: - feels well, without complaints. No nausea/vomiting/abdominal pain.  Objective: Filed Vitals:   01/20/13 2000 01/20/13 2122 01/21/13 0500 01/21/13 0523  BP: 108/75 115/78  104/66  Pulse: 75 73  74  Temp: 98.7 F (37.1 C) 99.7 F (37.6 C)  98.6 F (37 C)  TempSrc: Oral Oral  Oral  Resp: 22 24  20   Height:      Weight:   65 kg (143 lb 4.8 oz)   SpO2: 98% 97%  98%    Intake/Output Summary (Last 24 hours) at 01/21/13 1012 Last data filed at 01/21/13 0003  Gross per 24 hour  Intake    240 ml  Output    801 ml  Net   -561 ml  Filed Weights   01/17/13 0410 01/20/13 0500 01/21/13 0500  Weight: 72.5 kg (159 lb 13.3 oz) 66 kg (145 lb 8.1 oz) 65 kg (143 lb 4.8 oz)    Exam:  General:  NAD, visibly jaundiced  Cardiovascular: regular rate and rhythm, without MRG  Respiratory: good air movement, clear to auscultation throughout, no wheezing, ronchi or rales  Abdomen: soft, not tender to palpation, positive bowel sounds  MSK: no peripheral edema  Neuro: CN 2-12 grossly intact, MS 5/5 in all 4  Data Reviewed: Basic Metabolic Panel:  Recent Labs Lab 01/15/13 0439 01/16/13 0346  01/18/13 0350  01/19/13 0350 01/19/13 1546 01/20/13 0325 01/21/13 0428  NA 137 134*  < > 136 140 138 138 138  K 3.2* 4.9  < > 2.4* 2.6* 3.8 3.5 3.1*  CL 100 100  < > 99 101 99 101 100  CO2 26 25  < > 28 29 28 29 29   GLUCOSE 68* 196*  < > 89 97 188* 86 79  BUN <3* <3*  < > 6 8 7 7 7   CREATININE 0.52 0.54  < > 0.53 0.56 0.55 0.52 0.61  CALCIUM 7.9* 8.1*  < > 7.8* 8.1* 8.3* 8.4 8.6  MG  --  1.6  --   --  1.4* 2.3 2.1  --   PHOS  --  2.9  --   --  2.7  --   --   --   < > = values in this interval not displayed. Liver Function Tests:  Recent Labs Lab 01/17/13 0338 01/18/13 0350 01/19/13 0350 01/20/13 0325 01/21/13 0428  AST 175* 177* 170* 173* 184*  ALT 73* 67* 65* 65* 68*  ALKPHOS 162* 152* 173* 158* 146*  BILITOT 18.8* 21.5* 22.9* 24.4* 25.8*  PROT 4.7* 4.7* 4.8* 5.1* 5.2*  ALBUMIN 1.8* 1.8* 2.0* 2.0* 2.0*   CBC:  Recent Labs Lab 01/17/13 0338 01/18/13 0350 01/19/13 0350 01/20/13 0325 01/21/13 0428  WBC 10.3 10.9* 9.0 8.2 7.9  HGB 11.8* 11.5* 11.5* 11.5* 11.3*  HCT 34.9* 33.4* 34.3* 34.3* 33.9*  MCV 108.7* 106.0* 107.5* 108.5* 109.0*  PLT 135* 154 158 174 174   BNP (last 3 results)  Recent Labs  01/16/13 1230 01/19/13 0350  PROBNP 282.4* 41.7   CBG:  Recent Labs Lab 01/20/13 0009 01/20/13 0308 01/20/13 0747 01/20/13 1158 01/20/13 1544  GLUCAP 82 84 77 129* 146*    Recent Results (from the past 240 hour(s))  MRSA PCR SCREENING     Status: None   Collection Time    01/13/13 11:35 PM      Result Value Range Status   MRSA by PCR NEGATIVE  NEGATIVE Final   Comment:            The GeneXpert MRSA Assay (FDA     approved for NASAL specimens     only), is one component of a     comprehensive MRSA colonization     surveillance program. It is not     intended to diagnose MRSA     infection nor to guide or     monitor treatment for     MRSA infections.  BODY FLUID CULTURE     Status: None   Collection Time    01/17/13 12:50 PM      Result Value Range Status    Specimen Description PLEURAL   Final   Special Requests Normal   Final   Gram Stain     Final  Value: NO WBC SEEN     NO ORGANISMS SEEN     Performed at [facility]   Culture     Final   Value: NO GROWTH 3 DAYS     Performed at [facility]   Report Status PENDING   Incomplete     Studies: No results found.  Scheduled Meds: . antiseptic oral rinse  15 mL Mouth Rinse BID  . feeding supplement  237 mL Oral BID BM  . folic acid  1 mg Intravenous Daily   Or  . folic acid  1 mg Oral Daily  . levETIRAcetam  1,000 mg Oral BID  . LORazepam  1 mg Oral Q8H  . methylPREDNISolone (SOLU-MEDROL) injection  40 mg Intravenous Q24H  . multivitamin with minerals  1 tablet Oral Daily  . pantoprazole  40 mg Oral BID  . sodium chloride  3 mL Intravenous Q12H  . thiamine  100 mg Oral Daily   Or  . thiamine  100 mg Intravenous Daily   Continuous Infusions:  Principal Problem:   Alcohol withdrawal Active Problems:   Seizure disorder   Alcohol abuse   Thrombocytopenia   Transaminitis   Hypokalemia   Hypomagnesemia   Ulceration, tongue traumatic   Coagulopathy   Hepatitis   Acute pulmonary edema   Alcoholic cardiomyopathy   Acute respiratory failure with hypoxia   Pleural effusion, bilateral  Time spent: 25  Pamella Pert, MD [facility] Pager [phone]. If 7 PM - 7 AM, please contact night-coverage at [email or web address], password [facility] 01/21/2013, 10:12 AM  LOS: 8 days

## 2013-01-22 LAB — HEPATIC FUNCTION PANEL
ALT: 68 U/L — ABNORMAL HIGH (ref 0–53)
Albumin: 1.9 g/dL — ABNORMAL LOW (ref 3.5–5.2)
Alkaline Phosphatase: 159 U/L — ABNORMAL HIGH (ref 39–117)
Indirect Bilirubin: 6.2 mg/dL — ABNORMAL HIGH (ref 0.3–0.9)
Total Bilirubin: 24.4 mg/dL (ref 0.3–1.2)

## 2013-01-22 LAB — BASIC METABOLIC PANEL
CO2: 29 mEq/L (ref 19–32)
Calcium: 8.4 mg/dL (ref 8.4–10.5)
Chloride: 100 mEq/L (ref 96–112)
Creatinine, Ser: 0.56 mg/dL (ref 0.50–1.35)
Glucose, Bld: 103 mg/dL — ABNORMAL HIGH (ref 70–99)

## 2013-01-22 LAB — PROTIME-INR: Prothrombin Time: 16.1 seconds — ABNORMAL HIGH (ref 11.6–15.2)

## 2013-01-22 LAB — CBC
HCT: 33 % — ABNORMAL LOW (ref 39.0–52.0)
Hemoglobin: 11.1 g/dL — ABNORMAL LOW (ref 13.0–17.0)
MCH: 36.3 pg — ABNORMAL HIGH (ref 26.0–34.0)
MCHC: 33.6 g/dL (ref 30.0–36.0)
MCV: 107.8 fL — ABNORMAL HIGH (ref 78.0–100.0)
Platelets: 195 10*3/uL (ref 150–400)
RBC: 3.06 MIL/uL — ABNORMAL LOW (ref 4.22–5.81)
WBC: 8.8 10*3/uL (ref 4.0–10.5)

## 2013-01-22 MED ORDER — POTASSIUM CHLORIDE CRYS ER 20 MEQ PO TBCR
40.0000 meq | EXTENDED_RELEASE_TABLET | Freq: Once | ORAL | Status: AC
  Administered 2013-01-22: 40 meq via ORAL
  Filled 2013-01-22: qty 2

## 2013-01-22 MED ORDER — POTASSIUM CHLORIDE 20 MEQ/15ML (10%) PO LIQD
40.0000 meq | Freq: Once | ORAL | Status: DC
  Filled 2013-01-22: qty 30

## 2013-01-22 MED ORDER — POTASSIUM CHLORIDE CRYS ER 20 MEQ PO TBCR
30.0000 meq | EXTENDED_RELEASE_TABLET | Freq: Once | ORAL | Status: AC
  Administered 2013-01-22: 12:00:00 30 meq via ORAL
  Filled 2013-01-22: qty 1

## 2013-01-22 MED ORDER — MAGNESIUM SULFATE 40 MG/ML IJ SOLN
2.0000 g | Freq: Once | INTRAMUSCULAR | Status: AC
  Administered 2013-01-22: 10:00:00 2 g via INTRAVENOUS
  Filled 2013-01-22: qty 50

## 2013-01-22 NOTE — Progress Notes (Signed)
Subjective: No acute events.  Feels well overall, but he did have a bout of nausea and vomiting earlier.  Objective: Vital signs in last 24 hours: Temp:  [98.4 F (36.9 C)-98.7 F (37.1 C)] 98.7 F (37.1 C) (09/25 0545) Pulse Rate:  [71-86] 71 (09/25 0545) Resp:  [16-20] 16 (09/25 0545) BP: (97-110)/(53-66) 97/53 mmHg (09/25 0545) SpO2:  [98 %] 98 % (09/25 0545) Weight:  [141 lb 6.4 oz (64.139 kg)] 141 lb 6.4 oz (64.139 kg) (09/25 0545) Last BM Date: 01/20/13  Intake/Output from previous day: 09/24 0701 - 09/25 0700 In: 600 [P.O.:600] Out: 826 [Urine:825; Emesis/NG output:1] Intake/Output this shift: Total I/O In: 240 [P.O.:240] Out: -   General appearance: alert, no distress and shaky in his hands GI: hepatomegaly  Lab Results:  Recent Labs  01/20/13 0325 01/21/13 0428 01/22/13 0419  WBC 8.2 7.9 8.8  HGB 11.5* 11.3* 11.1*  HCT 34.3* 33.9* 33.0*  PLT 174 174 195   BMET  Recent Labs  01/20/13 0325 01/21/13 0428 01/22/13 0419  NA 138 138 138  K 3.5 3.1* 2.6*  CL 101 100 100  CO2 29 29 29   GLUCOSE 86 79 103*  BUN 7 7 8   CREATININE 0.52 0.61 0.56  CALCIUM 8.4 8.6 8.4   LFT  Recent Labs  01/22/13 0419  PROT 4.8*  ALBUMIN 1.9*  AST 187*  ALT 68*  ALKPHOS 159*  BILITOT 24.4*  BILIDIR 18.2*  IBILI 6.2*   PT/INR  Recent Labs  01/22/13 0419  LABPROT 16.1*  INR 1.32   Hepatitis Panel No results found for this basename: HEPBSAG, HCVAB, HEPAIGM, HEPBIGM,  in the last 72 hours C-Diff No results found for this basename: CDIFFTOX,  in the last 72 hours Fecal Lactopherrin No results found for this basename: FECLLACTOFRN,  in the last 72 hours  Studies/Results: No results found.  Medications:  Scheduled: . antiseptic oral rinse  15 mL Mouth Rinse BID  . feeding supplement  237 mL Oral BID BM  . folic acid  1 mg Intravenous Daily   Or  . folic acid  1 mg Oral Daily  . levETIRAcetam  1,000 mg Oral BID  . LORazepam  1 mg Oral Q8H  .  methylPREDNISolone (SOLU-MEDROL) injection  40 mg Intravenous Q24H  . multivitamin with minerals  1 tablet Oral Daily  . pantoprazole  40 mg Oral BID  . sodium chloride  3 mL Intravenous Q12H  . thiamine  100 mg Oral Daily   Or  . thiamine  100 mg Intravenous Daily   Continuous:   Assessment/Plan: 1) Alcoholic hepatitis. 2) ETOH abuse. 3) Hepatomegaly.   His bilirubin appears to have peaked yesterday.  It is slightly lower today.  He still appears to be very weak and shaky.   Plan: 1) Clinically he is progressing. 2) He needs to be on methylprednisolone for a total of 28 days and then tapered.   3) Follow up with me in 1-2 weeks upon discharge. 4) Signing off.  Call with any questions.  LOS: 9 days   Nyra Anspaugh D 01/22/2013, 1:18 PM

## 2013-01-22 NOTE — Progress Notes (Signed)
[facility] PROGRESS NOTE  Eric Mueller ZOX:[id number] DOB: 29-Jul-1985 DOA: 01/13/2013 PCP: Lucilla Edin, MD  HPI: Eric Mueller is a 27 y.o. male has a past medical history significant for alcohol abuse, with a prior hospitalization in January of 2014 for acute pancreatitis, another hospitalization in July for alcohol withdrawal seizures, presents to the emergency room with a chief complaint of yellowing of his eyes, fatigue, easy bruising, progressively over the past 2 weeks. Patient was admitted with alcoholic hepatitis and subsequently admitted to ICU for progressive acute encephalopathy/DTs. Mental status slowly improved and transferred to floor 01/20/2013.  Assessment/Plan:  Acute on chronic liver injury/alcoholic hepatitis  - T bili a bit better than yesterday, AST/ALT stable  - continue steroids - Doppler ultrasound of liver show portal and hepatic veins patent and with normal flow  Seizure -continue keppra.  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 No bleeding.  ETOH abuse/Severe DTs  - resolved  DVT Prophylaxis - SCDs.  Code Status: Full Family Communication: mother bedside  Disposition Plan: inpatient  Consultants:  GI  SIGNIFICANT EVENTS / STUDIES:  9/16 - [place] abd/pelvis>>> Portal and hepatic veins are patent with normal flow. Hepatomegally, GB sludge.  9/19 - [place] abd>>>no ascites  9/19 - Echo>>>EF 45-55% diffuse hypokinesis  CULTURES:  MRSA PCR 9/16>>neg  Pleural Fluid Culture 9/20>>>   ANTIBIOTICS:  Unasyn 9/20>>>9/22  Anti-infectives   Start     Dose/Rate Route Frequency Ordered Stop   01/17/13 0900  ampicillin-sulbactam (UNASYN) 1.5 g in sodium chloride 0.9 % 50 mL IVPB  Status:   Discontinued     1.5 g 100 mL/hr over 30 Minutes Intravenous Every 6 hours 01/17/13 0819 01/19/13 1208   01/13/13 2215  cefTRIAXone (ROCEPHIN) 1 g in dextrose 5 % 50 mL IVPB     1 g 100 mL/hr over 30 Minutes Intravenous  Once 01/13/13 2209 01/14/13 0023     Antibiotics Given (last 72 hours)   Date/Time Action Medication Dose Rate   01/19/13 1001 Given   ampicillin-sulbactam (UNASYN) 1.5 g in sodium chloride 0.9 % 50 mL IVPB 1.5 g 100 mL/hr     HPI/Subjective: - feels well, without complaints. Has good appetite and eats well.  Objective: Filed Vitals:   01/21/13 1305 01/21/13 1400 01/21/13 2140 01/22/13 0545  BP: 108/72 110/66 103/63 97/53  Pulse: 81 86 83 71  Temp: 99 F (37.2 C)  98.4 F (36.9 C) 98.7 F (37.1 C)  TempSrc: Oral  Oral Oral  Resp: 20  20 16   Height:      Weight:    64.139 kg (141 lb 6.4 oz)  SpO2: 97%  98% 98%    Intake/Output Summary (Last 24 hours) at 01/22/13 0842 Last data filed at 01/22/13 0500  Gross per 24 hour  Intake    600 ml  Output    826 ml  Net   -226 ml   Filed Weights   01/20/13 0500 01/21/13 0500 01/22/13 0545  Weight: 66 kg (145 lb 8.1 oz) 65 kg (143 lb 4.8 oz) 64.139 kg (141 lb 6.4 oz)    Exam:  General:  NAD, visibly jaundiced  Cardiovascular: regular rate and rhythm, without MRG  Respiratory: good air movement, clear to auscultation throughout, no wheezing, ronchi  or rales  Abdomen: soft, not tender to palpation, positive bowel sounds  MSK: no peripheral edema  Neuro: CN 2-12 grossly intact, MS 5/5 in all 4  Data Reviewed: Basic Metabolic Panel:  Recent Labs Lab 01/16/13 0346  01/19/13 0350 01/19/13 1546 01/20/13 0325 01/21/13 0428 01/22/13 0419  NA 134*  < > 140 138 138 138 138  K 4.9  < > 2.6* 3.8 3.5 3.1* 2.6*  CL 100  < > 101 99 101 100 100  CO2 25  < > 29 28 29 29 29   GLUCOSE 196*  < > 97 188* 86 79 103*  BUN <3*  < > 8 7 7 7 8   CREATININE 0.54  < > 0.56 0.55 0.52 0.61 0.56  CALCIUM 8.1*  < > 8.1*  8.3* 8.4 8.6 8.4  MG 1.6  --  1.4* 2.3 2.1  --  1.8  PHOS 2.9  --  2.7  --   --   --   --   < > = values in this interval not displayed. Liver Function Tests:  Recent Labs Lab 01/18/13 0350 01/19/13 0350 01/20/13 0325 01/21/13 0428 01/22/13 0419  AST 177* 170* 173* 184* 187*  ALT 67* 65* 65* 68* 68*  ALKPHOS 152* 173* 158* 146* 159*  BILITOT 21.5* 22.9* 24.4* 25.8* 24.4*  PROT 4.7* 4.8* 5.1* 5.2* 4.8*  ALBUMIN 1.8* 2.0* 2.0* 2.0* 1.9*   CBC:  Recent Labs Lab 01/18/13 0350 01/19/13 0350 01/20/13 0325 01/21/13 0428 01/22/13 0419  WBC 10.9* 9.0 8.2 7.9 8.8  HGB 11.5* 11.5* 11.5* 11.3* 11.1*  HCT 33.4* 34.3* 34.3* 33.9* 33.0*  MCV 106.0* 107.5* 108.5* 109.0* 107.8*  PLT 154 158 174 174 195   BNP (last 3 results)  Recent Labs  01/16/13 1230 01/19/13 0350  PROBNP 282.4* 41.7   CBG:  Recent Labs Lab 01/20/13 0009 01/20/13 0308 01/20/13 0747 01/20/13 1158 01/20/13 1544  GLUCAP 82 84 77 129* 146*    Recent Results (from the past 240 hour(s))  MRSA PCR SCREENING     Status: None   Collection Time    01/13/13 11:35 PM      Result Value Range Status   MRSA by PCR NEGATIVE  NEGATIVE Final   Comment:            The GeneXpert MRSA Assay (FDA     approved for NASAL specimens     only), is one component of a     comprehensive MRSA colonization     surveillance program. It is not     intended to diagnose MRSA     infection nor to guide or     monitor treatment for     MRSA infections.  BODY FLUID CULTURE     Status: None   Collection Time    01/17/13 12:50 PM      Result Value Range Status   Specimen Description PLEURAL   Final   Special Requests Normal   Final   Gram Stain     Final   Value: NO WBC SEEN     NO ORGANISMS SEEN     Performed at [facility]   Culture     Final   Value: NO GROWTH 3 DAYS     Performed at [facility]   Report Status 01/21/2013 FINAL   Final     Studies: No results found.  Scheduled Meds: .  antiseptic oral rinse  15 mL Mouth Rinse BID  . feeding  supplement  237 mL Oral BID BM  . folic acid  1 mg Intravenous Daily   Or  . folic acid  1 mg Oral Daily  . levETIRAcetam  1,000 mg Oral BID  . LORazepam  1 mg Oral Q8H  . magnesium sulfate 1 - 4 g bolus IVPB  2 g Intravenous Once  . methylPREDNISolone (SOLU-MEDROL) injection  40 mg Intravenous Q24H  . multivitamin with minerals  1 tablet Oral Daily  . pantoprazole  40 mg Oral BID  . potassium chloride  30 mEq Oral Once  . potassium chloride  40 mEq Oral Once   Followed by  . potassium chloride  40 mEq Oral Once  . sodium chloride  3 mL Intravenous Q12H  . thiamine  100 mg Oral Daily   Or  . thiamine  100 mg Intravenous Daily   Continuous Infusions:  Principal Problem:   Alcohol withdrawal Active Problems:   Seizure disorder   Alcohol abuse   Thrombocytopenia   Transaminitis   Hypokalemia   Hypomagnesemia   Ulceration, tongue traumatic   Coagulopathy   Hepatitis   Acute pulmonary edema   Alcoholic cardiomyopathy   Acute respiratory failure with hypoxia   Pleural effusion, bilateral  Time spent: 25  Pamella Pert, MD [facility] Pager [phone]. If 7 PM - 7 AM, please contact night-coverage at [email or web address], password [facility] 01/22/2013, 8:42 AM  LOS: 9 days

## 2013-01-22 NOTE — Evaluation (Signed)
Physical Therapy Evaluation Patient Details Name: Eric Mueller MRN: [id number] DOB: 1986-02-10 Today's Date: 01/22/2013 Time: 4696-2952 PT Time Calculation (min): 34 min  PT Assessment / Plan / Recommendation History of Present Illness  Eric Mueller is a 27 y.o. male has a past medical history significant for alcohol abuse, with a prior hospitalization in January of 2014 for acute pancreatitis, another hospitalization in July for alcohol withdrawal seizures, presents to the emergency room with a chief complaint of yellowing of his eyes, fatigue, easy bruising, progressively over the past 2 weeks  Clinical Impression  Pt appears deconditioned and has an ataxic gait that is improved with the use of a straight cane. He was issued a home exercise program that he acknowledged understanding of. Do not think he needs skilled PT follow up, but recommend he ambulate ad lib with nursing and family with rehab dept straight cane that was left in pt room for him to borrow. He plans to ask his mom to get him a straight cane with better visual appeal.  PT Assessment  Patent does not need any further PT services    Follow Up Recommendations  No PT follow up    Does the patient have the potential to tolerate intense rehabilitation      Barriers to Discharge        Equipment Recommendations  Gilmer Mor (pt reports his mom will get him a cane)    Recommendations for Other Services     Frequency      Precautions / Restrictions Restrictions Weight Bearing Restrictions: No   Pertinent Vitals/Pain       Mobility  Bed Mobility Bed Mobility: Supine to Sit;Sit to Supine Supine to Sit: 7: Independent Sit to Supine: 7: Independent Transfers Transfers: Sit to Stand;Stand to Sit Sit to Stand: 7: Independent Stand to Sit: 7: Independent Ambulation/Gait Ambulation/Gait Assistance: 6: Modified independent (Device/Increase time) Ambulation Distance (Feet): 200 Feet Assistive device: Straight  cane Ambulation/Gait Assistance Details: assist for safety, cues for technique Gait Pattern: Step-through pattern;Ataxic;Decreased trunk rotation (decreased arm swing) Gait velocity: wfl  General Gait Details: pt gait is improved with use of straight cane he choosed to carry in his right hand. Pt states his mom is an Production designer, theatre/television/film and so should be able to get him an interesting cane to use Stairs: Yes Stairs Assistance: 5: Supervision Stairs Assistance Details (indicate cue type and reason): pt impulsive: assist for safety Stair Management Technique: One rail Right Number of Stairs: 3 Wheelchair Mobility Wheelchair Mobility: No    Exercises Other Exercises Other Exercises: pt educated to do short, frequent bouts of activity with attention to core strength for added balance stability Other Exercises: pt issued written copy of beginning core and LE strengthening program with emphasis on standing knee raises and standing hip abduction  hard copy placed in chart   PT Diagnosis:    PT Problem List:   PT Treatment Interventions:       PT Goals(Current goals can be found in the care plan section)    Visit Information  Last PT Received On: 01/22/13 History of Present Illness: Eric Mueller is a 27 y.o. male has a past medical history significant for alcohol abuse, with a prior hospitalization in January of 2014 for acute pancreatitis, another hospitalization in July for alcohol withdrawal seizures, presents to the emergency room with a chief complaint of yellowing of his eyes, fatigue, easy bruising, progressively over the past 2 weeks       Prior Functioning  Home Living Family/patient expects to be discharged to:: Private residence Living Arrangements: Parent Available Help at Discharge: Family Type of Home: House Home Layout: One level Home Equipment: None Prior Function Level of Independence: Independent Communication Communication: No difficulties    Cognition   Cognition Arousal/Alertness: Awake/alert Behavior During Therapy: WFL for tasks assessed/performed Overall Cognitive Status: Within Functional Limits for tasks assessed    Extremity/Trunk Assessment Lower Extremity Assessment Lower Extremity Assessment: Overall WFL for tasks assessed Cervical / Trunk Assessment Cervical / Trunk Assessment: Normal   Balance Balance Balance Assessed: Yes Static Sitting Balance Static Sitting - Balance Support: No upper extremity supported;Feet supported Static Sitting - Level of Assistance: 7: Independent Static Standing Balance Static Standing - Balance Support: No upper extremity supported;During functional activity Static Standing - Level of Assistance: 7: Independent Standardized Balance Assessment Standardized Balance Assessment: Dynamic Gait Index Dynamic Gait Index Level Surface: Mild Impairment (all categories improved with use of straight cane) Change in Gait Speed: Mild Impairment Gait with Horizontal Head Turns: Mild Impairment Gait with Vertical Head Turns: Mild Impairment Gait and Pivot Turn: Mild Impairment Step Over Obstacle: Mild Impairment Step Around Obstacles: Mild Impairment Steps: Mild Impairment Total Score: 16  End of Session PT - End of Session Activity Tolerance: Patient tolerated treatment well Patient left: in chair  GP    Rosey Bath K. [employer], [place] [phone] 01/22/2013, 12:17 PM

## 2013-01-22 NOTE — Progress Notes (Addendum)
Direct Bilirubin is 18.2. Indirect Bilirubin 6.2 Potassium 2.6. Lab. reported to nurse. MD was notified

## 2013-01-22 NOTE — Progress Notes (Signed)
Clinical Social Work  CSW met with patient at bedside in order to discuss treatment options. Patient reports he is tired and still has not reviewed information. CSW will follow up at later time.  [place], [place] [phone]

## 2013-01-23 LAB — HEPATIC FUNCTION PANEL
ALT: 78 U/L — ABNORMAL HIGH (ref 0–53)
Albumin: 2 g/dL — ABNORMAL LOW (ref 3.5–5.2)
Alkaline Phosphatase: 146 U/L — ABNORMAL HIGH (ref 39–117)
Bilirubin, Direct: 19.6 mg/dL — ABNORMAL HIGH (ref 0.0–0.3)
Indirect Bilirubin: 7.5 mg/dL — ABNORMAL HIGH (ref 0.3–0.9)
Total Bilirubin: 27.1 mg/dL (ref 0.3–1.2)
Total Protein: 4.9 g/dL — ABNORMAL LOW (ref 6.0–8.3)

## 2013-01-23 LAB — CBC
HCT: 33.9 % — ABNORMAL LOW (ref 39.0–52.0)
Hemoglobin: 11.3 g/dL — ABNORMAL LOW (ref 13.0–17.0)
MCH: 36 pg — ABNORMAL HIGH (ref 26.0–34.0)
MCV: 108 fL — ABNORMAL HIGH (ref 78.0–100.0)
Platelets: 222 10*3/uL (ref 150–400)
RBC: 3.14 MIL/uL — ABNORMAL LOW (ref 4.22–5.81)
WBC: 9.9 10*3/uL (ref 4.0–10.5)

## 2013-01-23 LAB — BASIC METABOLIC PANEL
BUN: 7 mg/dL (ref 6–23)
CO2: 31 mEq/L (ref 19–32)
Calcium: 8.6 mg/dL (ref 8.4–10.5)
Chloride: 100 mEq/L (ref 96–112)
Creatinine, Ser: 0.71 mg/dL (ref 0.50–1.35)
Sodium: 139 mEq/L (ref 135–145)

## 2013-01-23 MED ORDER — DIPHENHYDRAMINE-ZINC ACETATE 2-0.1 % EX CREA
TOPICAL_CREAM | Freq: Two times a day (BID) | CUTANEOUS | Status: DC | PRN
  Administered 2013-01-24 – 2013-01-27 (×2): via TOPICAL
  Filled 2013-01-23 (×3): qty 28

## 2013-01-23 MED ORDER — TRAMADOL HCL 50 MG PO TABS
50.0000 mg | ORAL_TABLET | Freq: Two times a day (BID) | ORAL | Status: DC | PRN
  Administered 2013-01-23 – 2013-01-31 (×11): 50 mg via ORAL
  Filled 2013-01-23 (×11): qty 1

## 2013-01-23 NOTE — Progress Notes (Signed)
[facility] PROGRESS NOTE  TITUS DRONE ZOX:[id number] DOB: Oct 08, 1985 DOA: 01/13/2013 PCP: Lucilla Edin, MD  HPI: Eric Mueller is a 27 y.o. male has a past medical history significant for alcohol abuse, with a prior hospitalization in January of 2014 for acute pancreatitis, another hospitalization in July for alcohol withdrawal seizures, presents to the emergency room with a chief complaint of yellowing of his eyes, fatigue, easy bruising, progressively over the past 2 weeks. Patient was admitted with alcoholic hepatitis and subsequently admitted to ICU for progressive acute encephalopathy/DTs. Mental status slowly improved and transferred to floor 01/20/2013.  Assessment/Plan:  Acute on chronic liver injury/alcoholic hepatitis - unfortunately his bilirubin continues to increase. He feels well and other than fatigue has no complaints. Continue steroids.  - close monitoring for mental status changes.  Seizure-continue keppra. 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 No bleeding.  ETOH abuse/Severe DTs  resolved  DVT Prophylaxis - SCDs.  Code Status: Full Family Communication: none today Disposition Plan: inpatient  Consultants:  GI  SIGNIFICANT EVENTS / STUDIES:  9/16 - [place] abd/pelvis>>> Portal and hepatic veins are patent with normal flow. Hepatomegally, GB sludge.  9/19 - [place] abd>>>no ascites  9/19 - Echo>>>EF 45-55% diffuse hypokinesis  CULTURES:  MRSA PCR 9/16>>neg  Pleural Fluid Culture 9/20>>>   ANTIBIOTICS:  Unasyn 9/20>>>9/22  Anti-infectives   Start     Dose/Rate Route Frequency Ordered Stop   01/17/13 0900  ampicillin-sulbactam (UNASYN) 1.5 g in sodium chloride 0.9 % 50 mL IVPB  Status:  Discontinued     1.5 g 100 mL/hr  over 30 Minutes Intravenous Every 6 hours 01/17/13 0819 01/19/13 1208   01/13/13 2215  cefTRIAXone (ROCEPHIN) 1 g in dextrose 5 % 50 mL IVPB     1 g 100 mL/hr over 30 Minutes Intravenous  Once 01/13/13 2209 01/14/13 0023     Antibiotics Given (last 72 hours)   None     HPI/Subjective: - feels well, without complaints. Has good appetite and eats well.  Objective: Filed Vitals:   01/22/13 0545 01/22/13 1300 01/22/13 2156 01/23/13 0434  BP: 97/53 109/68 114/71 120/65  Pulse: 71 69 81 88  Temp: 98.7 F (37.1 C) 98.5 F (36.9 C) 98.5 F (36.9 C) 98.6 F (37 C)  TempSrc: Oral Oral Oral Oral  Resp: 16 14 18 18   Height:      Weight: 64.139 kg (141 lb 6.4 oz)     SpO2: 98% 99% 98% 98%    Intake/Output Summary (Last 24 hours) at 01/23/13 1254 Last data filed at 01/22/13 2200  Gross per 24 hour  Intake    360 ml  Output      0 ml  Net    360 ml   Filed Weights   01/20/13 0500 01/21/13 0500 01/22/13 0545  Weight: 66 kg (145 lb 8.1 oz) 65 kg (143 lb 4.8 oz) 64.139 kg (141 lb 6.4 oz)    Exam:  General:  NAD, visibly jaundiced  Cardiovascular: regular rate and rhythm, without MRG  Respiratory: good air movement, clear to auscultation throughout, no wheezing, ronchi or rales  Abdomen: soft, not tender to palpation, positive bowel sounds  MSK: no peripheral edema  Neuro: CN 2-12 grossly intact, MS 5/5 in all 4  Data Reviewed: Basic Metabolic Panel:  Recent Labs Lab  01/19/13 0350 01/19/13 1546 01/20/13 0325 01/21/13 0428 01/22/13 0419 01/23/13 0430  NA 140 138 138 138 138 139  K 2.6* 3.8 3.5 3.1* 2.6* 3.5  CL 101 99 101 100 100 100  CO2 29 28 29 29 29 31   GLUCOSE 97 188* 86 79 103* 100*  BUN 8 7 7 7 8 7   CREATININE 0.56 0.55 0.52 0.61 0.56 0.71  CALCIUM 8.1* 8.3* 8.4 8.6 8.4 8.6  MG 1.4* 2.3 2.1  --  1.8  --   PHOS 2.7  --   --   --   --   --    Liver Function Tests:  Recent Labs Lab 01/19/13 0350 01/20/13 0325 01/21/13 0428 01/22/13 0419  01/23/13 0430  AST 170* 173* 184* 187* 200*  ALT 65* 65* 68* 68* 78*  ALKPHOS 173* 158* 146* 159* 146*  BILITOT 22.9* 24.4* 25.8* 24.4* 27.1*  PROT 4.8* 5.1* 5.2* 4.8* 4.9*  ALBUMIN 2.0* 2.0* 2.0* 1.9* 2.0*   CBC:  Recent Labs Lab 01/19/13 0350 01/20/13 0325 01/21/13 0428 01/22/13 0419 01/23/13 0430  WBC 9.0 8.2 7.9 8.8 9.9  HGB 11.5* 11.5* 11.3* 11.1* 11.3*  HCT 34.3* 34.3* 33.9* 33.0* 33.9*  MCV 107.5* 108.5* 109.0* 107.8* 108.0*  PLT 158 174 174 195 222   BNP (last 3 results)  Recent Labs  01/16/13 1230 01/19/13 0350  PROBNP 282.4* 41.7   CBG:  Recent Labs Lab 01/20/13 0009 01/20/13 0308 01/20/13 0747 01/20/13 1158 01/20/13 1544  GLUCAP 82 84 77 129* 146*    Recent Results (from the past 240 hour(s))  MRSA PCR SCREENING     Status: None   Collection Time    01/13/13 11:35 PM      Result Value Range Status   MRSA by PCR NEGATIVE  NEGATIVE Final   Comment:            The GeneXpert MRSA Assay (FDA     approved for NASAL specimens     only), is one component of a     comprehensive MRSA colonization     surveillance program. It is not     intended to diagnose MRSA     infection nor to guide or     monitor treatment for     MRSA infections.  BODY FLUID CULTURE     Status: None   Collection Time    01/17/13 12:50 PM      Result Value Range Status   Specimen Description PLEURAL   Final   Special Requests Normal   Final   Gram Stain     Final   Value: NO WBC SEEN     NO ORGANISMS SEEN     Performed at [facility]   Culture     Final   Value: NO GROWTH 3 DAYS     Performed at [facility]   Report Status 01/21/2013 FINAL   Final     Studies: No results found.  Scheduled Meds: . antiseptic oral rinse  15 mL Mouth Rinse BID  . feeding supplement  237 mL Oral BID BM  . folic acid  1 mg Intravenous Daily   Or  . folic acid  1 mg Oral Daily  . levETIRAcetam  1,000 mg Oral BID  . LORazepam  1 mg Oral Q8H  . methylPREDNISolone  (SOLU-MEDROL) injection  40 mg Intravenous Q24H  . multivitamin with minerals  1 tablet Oral Daily  . pantoprazole  40 mg Oral BID  .  sodium chloride  3 mL Intravenous Q12H  . thiamine  100 mg Oral Daily   Or  . thiamine  100 mg Intravenous Daily   Continuous Infusions:  Principal Problem:   Alcohol withdrawal Active Problems:   Seizure disorder   Alcohol abuse   Thrombocytopenia   Transaminitis   Hypokalemia   Hypomagnesemia   Ulceration, tongue traumatic   Coagulopathy   Hepatitis   Acute pulmonary edema   Alcoholic cardiomyopathy   Acute respiratory failure with hypoxia   Pleural effusion, bilateral  Time spent: 25  Pamella Pert, MD [facility] Pager [phone]. If 7 PM - 7 AM, please contact night-coverage at [email or web address], password [facility] 01/23/2013, 12:54 PM  LOS: 10 days

## 2013-01-24 LAB — BASIC METABOLIC PANEL
CO2: 29 mEq/L (ref 19–32)
Calcium: 8.5 mg/dL (ref 8.4–10.5)
Chloride: 97 mEq/L (ref 96–112)
Creatinine, Ser: 0.6 mg/dL (ref 0.50–1.35)
GFR calc Af Amer: >90 mL/min (ref >90)
Potassium: 2.9 mEq/L — ABNORMAL LOW (ref 3.5–5.1)
Sodium: 137 mEq/L (ref 135–145)

## 2013-01-24 LAB — HEPATIC FUNCTION PANEL
AST: 195 U/L — ABNORMAL HIGH (ref 0–37)
Alkaline Phosphatase: 144 U/L — ABNORMAL HIGH (ref 39–117)
Indirect Bilirubin: 7.1 mg/dL — ABNORMAL HIGH (ref 0.3–0.9)
Total Protein: 4.8 g/dL — ABNORMAL LOW (ref 6.0–8.3)

## 2013-01-24 LAB — PROTIME-INR
INR: 1.3 (ref 0.00–1.49)
Prothrombin Time: 15.9 seconds — ABNORMAL HIGH (ref 11.6–15.2)

## 2013-01-24 LAB — PHOSPHORUS: Phosphorus: 3.4 mg/dL (ref 2.3–4.6)

## 2013-01-24 LAB — MAGNESIUM: Magnesium: 1.9 mg/dL (ref 1.5–2.5)

## 2013-01-24 MED ORDER — POTASSIUM CHLORIDE CRYS ER 20 MEQ PO TBCR
40.0000 meq | EXTENDED_RELEASE_TABLET | Freq: Two times a day (BID) | ORAL | Status: AC
  Administered 2013-01-24 (×2): 40 meq via ORAL
  Filled 2013-01-24 (×2): qty 2

## 2013-01-24 MED ORDER — ENOXAPARIN SODIUM 30 MG/0.3ML ~~LOC~~ SOLN
30.0000 mg | SUBCUTANEOUS | Status: DC
  Administered 2013-01-24 – 2013-01-26 (×3): 30 mg via SUBCUTANEOUS
  Filled 2013-01-24 (×4): qty 0.3

## 2013-01-24 NOTE — Progress Notes (Signed)
Provided Pt's family member a copy of the SA information given to Pt last week, as requested.  Providence Crosby, LCSWA Clinical Social Work [phone]

## 2013-01-24 NOTE — Progress Notes (Signed)
[facility] PROGRESS NOTE  Eric Mueller JWJ:[id number] DOB: [birth year] DOA: 01/13/2013 PCP: Lucilla Edin, MD  HPI: Eric Mueller is a 27 y.o. male has a past medical history significant for alcohol abuse, with a prior hospitalization in January of 2014 for acute pancreatitis, another hospitalization in July for alcohol withdrawal seizures, presents to the emergency room with a chief complaint of yellowing of his eyes, fatigue, easy bruising, progressively over the past 2 weeks. Patient was admitted with alcoholic hepatitis and subsequently admitted to ICU for progressive acute encephalopathy/DTs. Mental status slowly improved and transferred to floor 01/20/2013.  Assessment/Plan:  Acute on chronic liver injury/alcoholic hepatitis - T bili stable to around 27 - fatigued - continue steroids - close monitoring for mental status changes.  Seizure-continue keppra. 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 No bleeding.  ETOH abuse/Severe DTs  resolved  DVT Prophylaxis - SCDs. Lovenox.  Code Status: Full Family Communication: none today Disposition Plan: inpatient  Consultants:  GI  SIGNIFICANT EVENTS / STUDIES:  9/16 - [place] abd/pelvis>>> Portal and hepatic veins are patent with normal flow. Hepatomegally, GB sludge.  9/19 - [place] abd>>>no ascites  9/19 - Echo>>>EF 45-55% diffuse hypokinesis  CULTURES:  MRSA PCR 9/16>>neg  Pleural Fluid Culture 9/20>>>   ANTIBIOTICS:  Unasyn 9/20>>>9/22  Anti-infectives   Start     Dose/Rate Route Frequency Ordered Stop   01/17/13 0900  ampicillin-sulbactam (UNASYN) 1.5 g in sodium chloride 0.9 % 50 mL IVPB  Status:  Discontinued     1.5 g 100 mL/hr over 30 Minutes Intravenous Every 6 hours 01/17/13 0819  01/19/13 1208   01/13/13 2215  cefTRIAXone (ROCEPHIN) 1 g in dextrose 5 % 50 mL IVPB     1 g 100 mL/hr over 30 Minutes Intravenous  Once 01/13/13 2209 01/14/13 0023     Antibiotics Given (last 72 hours)   None     HPI/Subjective: - no complaints  Objective: Filed Vitals:   01/23/13 1418 01/23/13 2104 01/24/13 0501 01/24/13 0605  BP: 118/65 111/70 108/63 106/59  Pulse: 76 87 85 80  Temp: 98.6 F (37 C) 99.3 F (37.4 C) 98.3 F (36.8 C) 98.9 F (37.2 C)  TempSrc: Oral Oral Oral Oral  Resp: 20 18 18 16   Height:      Weight:   68.765 kg (151 lb 9.6 oz)   SpO2: 98% 99% 99% 100%    Intake/Output Summary (Last 24 hours) at 01/24/13 0741 Last data filed at 01/23/13 2200  Gross per 24 hour  Intake   1080 ml  Output      0 ml  Net   1080 ml   Filed Weights   01/21/13 0500 01/22/13 0545 01/24/13 0501  Weight: 65 kg (143 lb 4.8 oz) 64.139 kg (141 lb 6.4 oz) 68.765 kg (151 lb 9.6 oz)    Exam:  General:  NAD, visibly jaundiced  Cardiovascular: regular rate and rhythm, without MRG  Respiratory: good air movement, clear to auscultation throughout, no wheezing, ronchi or rales  Abdomen: soft, not tender to palpation, positive bowel sounds  MSK: no peripheral edema  Neuro: CN 2-12 grossly intact, MS 5/5 in all 4  Data Reviewed: Basic Metabolic Panel:  Recent Labs Lab 01/19/13 0350 01/19/13 1546 01/20/13 0325 01/21/13 0428 01/22/13 0419 01/23/13 0430 01/24/13 0603  NA 140  138 138 138 138 139 137  K 2.6* 3.8 3.5 3.1* 2.6* 3.5 2.9*  CL 101 99 101 100 100 100 97  CO2 29 28 29 29 29 31 29   GLUCOSE 97 188* 86 79 103* 100* 106*  BUN 8 7 7 7 8 7 8   CREATININE 0.56 0.55 0.52 0.61 0.56 0.71 0.60  CALCIUM 8.1* 8.3* 8.4 8.6 8.4 8.6 8.5  MG 1.4* 2.3 2.1  --  1.8  --  1.9  PHOS 2.7  --   --   --   --   --  3.4   Liver Function Tests:  Recent Labs Lab 01/20/13 0325 01/21/13 0428 01/22/13 0419 01/23/13 0430 01/24/13 0603  AST 173* 184* 187* 200* 195*  ALT 65*  68* 68* 78* 79*  ALKPHOS 158* 146* 159* 146* 144*  BILITOT 24.4* 25.8* 24.4* 27.1* 27.5*  PROT 5.1* 5.2* 4.8* 4.9* 4.8*  ALBUMIN 2.0* 2.0* 1.9* 2.0* 1.9*   CBC:  Recent Labs Lab 01/19/13 0350 01/20/13 0325 01/21/13 0428 01/22/13 0419 01/23/13 0430  WBC 9.0 8.2 7.9 8.8 9.9  HGB 11.5* 11.5* 11.3* 11.1* 11.3*  HCT 34.3* 34.3* 33.9* 33.0* 33.9*  MCV 107.5* 108.5* 109.0* 107.8* 108.0*  PLT 158 174 174 195 222   BNP (last 3 results)  Recent Labs  01/16/13 1230 01/19/13 0350  PROBNP 282.4* 41.7   CBG:  Recent Labs Lab 01/20/13 0009 01/20/13 0308 01/20/13 0747 01/20/13 1158 01/20/13 1544  GLUCAP 82 84 77 129* 146*    Recent Results (from the past 240 hour(s))  BODY FLUID CULTURE     Status: None   Collection Time    01/17/13 12:50 PM      Result Value Range Status   Specimen Description PLEURAL   Final   Special Requests Normal   Final   Gram Stain     Final   Value: NO WBC SEEN     NO ORGANISMS SEEN     Performed at [facility]   Culture     Final   Value: NO GROWTH 3 DAYS     Performed at [facility]   Report Status 01/21/2013 FINAL   Final     Studies: No results found.  Scheduled Meds: . antiseptic oral rinse  15 mL Mouth Rinse BID  . feeding supplement  237 mL Oral BID BM  . folic acid  1 mg Intravenous Daily   Or  . folic acid  1 mg Oral Daily  . levETIRAcetam  1,000 mg Oral BID  . LORazepam  1 mg Oral Q8H  . methylPREDNISolone (SOLU-MEDROL) injection  40 mg Intravenous Q24H  . multivitamin with minerals  1 tablet Oral Daily  . pantoprazole  40 mg Oral BID  . potassium chloride  40 mEq Oral BID  . sodium chloride  3 mL Intravenous Q12H  . thiamine  100 mg Oral Daily   Or  . thiamine  100 mg Intravenous Daily   Continuous Infusions:  Principal Problem:   Alcohol withdrawal Active Problems:   Seizure disorder   Alcohol abuse   Thrombocytopenia   Transaminitis   Hypokalemia   Hypomagnesemia   Ulceration, tongue  traumatic   Coagulopathy   Hepatitis   Acute pulmonary edema   Alcoholic cardiomyopathy   Acute respiratory failure with hypoxia   Pleural effusion, bilateral  Time spent: 25  Pamella Pert, MD [facility] Pager [phone]. If 7 PM - 7 AM, please contact night-coverage at [email or web address], password [facility]  01/24/2013, 7:41 AM  LOS: 11 days

## 2013-01-25 DIAGNOSIS — K769 Liver disease, unspecified: Secondary | ICD-10-CM

## 2013-01-25 LAB — HEPATIC FUNCTION PANEL
Bilirubin, Direct: 22 mg/dL — ABNORMAL HIGH (ref 0.0–0.3)
Indirect Bilirubin: 7.1 mg/dL — ABNORMAL HIGH (ref 0.3–0.9)
Total Protein: 4.9 g/dL — ABNORMAL LOW (ref 6.0–8.3)

## 2013-01-25 MED ORDER — POTASSIUM CHLORIDE CRYS ER 20 MEQ PO TBCR
30.0000 meq | EXTENDED_RELEASE_TABLET | Freq: Once | ORAL | Status: AC
  Administered 2013-01-25: 18:00:00 30 meq via ORAL
  Filled 2013-01-25: qty 1

## 2013-01-25 MED ORDER — LORAZEPAM 0.5 MG PO TABS
0.5000 mg | ORAL_TABLET | Freq: Three times a day (TID) | ORAL | Status: DC | PRN
  Administered 2013-01-25 – 2013-01-31 (×12): 0.5 mg via ORAL
  Filled 2013-01-25 (×12): qty 1

## 2013-01-25 NOTE — Progress Notes (Signed)
Subjective: Since I last evaluated the patient, he seems to be me more jaundiced. His father is at his bedside and is VERY concerned about the final outcome of this "liver condition". He is keen to know if his son has alcoholic cirrhosis. Eric Mueller is not eating well and does not have much of an appetite. He seems worried as his LFT's seem to be getting worse day by day.    Objective: Vital signs in last 24 hours: Temp:  [98.6 F (37 C)-99.4 F (37.4 C)] 98.6 F (37 C) (09/28 1300) Pulse Rate:  [66-80] 70 (09/28 1300) Resp:  [15-20] 15 (09/28 1300) BP: (107-113)/(65-77) 108/77 mmHg (09/28 1300) SpO2:  [97 %-99 %] 99 % (09/28 1300) Weight:  [64.1 kg (141 lb 5 oz)] 64.1 kg (141 lb 5 oz) (09/28 0600) Last BM Date: 01/22/13  Intake/Output from previous day:   Intake/Output this shift:    General appearance: alert, cooperative, appears stated age and fatigued and severely jaundiced including scleral icterus.  Resp: clear to auscultation bilaterally Cardio: regular rate and rhythm, S1, S2 normal, no murmur, click, rub or gallop GI: soft, non-tender; bowel sounds normal; no masses,  no organomegaly Extremities: extremities normal, atraumatic, no cyanosis or edema; patient has multiple bruises all over him  Lab Results:  Recent Labs  01/23/13 0430  WBC 9.9  HGB 11.3*  HCT 33.9*  PLT 222   BMET  Recent Labs  01/23/13 0430 01/24/13 0603 01/25/13 0555  NA 139 137  --   K 3.5 2.9* 3.5  CL 100 97  --   CO2 31 29  --   GLUCOSE 100* 106*  --   BUN 7 8  --   CREATININE 0.71 0.60  --   CALCIUM 8.6 8.5  --    LFT  Recent Labs  01/25/13 0555  PROT 4.9*  ALBUMIN 1.9*  AST 195*  ALT 83*  ALKPHOS 136*  BILITOT 29.1*  BILIDIR 22.0*  IBILI 7.1*   PT/INR  Recent Labs  01/24/13 0603  LABPROT 15.9*  INR 1.30   Medications: I have reviewed the patient's current medications.  Assessment/Plan: 1) Severe alcoholic hepatitis on steroids. Will watch closely for any signs of  decompensation.He also has a coagulopathy but his platelets are normal. Will try to get a nutrition consult. I spent over 30 minutes with his father, try to explain the gravity of his illness. 2) Seizure disorder on Keppra.  3) Alcoholic cardiomyopathy.    LOS: 12 days   Eric Mueller 01/25/2013, 2:48 PM

## 2013-01-25 NOTE — Progress Notes (Signed)
[facility] PROGRESS NOTE  GUILLERMO NEHRING ZOX:[id number] DOB: [birth year] DOA: 01/13/2013 PCP: Lucilla Edin, MD  HPI: Eric Mueller is a 27 y.o. male has a past medical history significant for alcohol abuse, with a prior hospitalization in January of 2014 for acute pancreatitis, another hospitalization in July for alcohol withdrawal seizures, presents to the emergency room with a chief complaint of yellowing of his eyes, fatigue, easy bruising, progressively over the past 2 weeks. Patient was admitted with alcoholic hepatitis and subsequently admitted to ICU for progressive acute encephalopathy/DTs. Mental status slowly improved and transferred to floor 01/20/2013.  Assessment/Plan:  Acute on chronic liver injury/alcoholic hepatitis - unfortunately his liver function is deteriorating, I have asked GI to assist with his management today, appreciate their help. I am concerned with his trajectory. Continues to be fatigued and does not appear to have good po intake. Will ask nutrition to see him as well. Meanwhile continue supportive treatment. Close monitoring for mental status changes.  Seizure-continue keppra. 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 No bleeding.  ETOH abuse/Severe DTs  resolved  DVT Prophylaxis - SCDs. Lovenox.  Code Status: Full Family Communication: none today Disposition Plan: inpatient  Consultants:  GI  SIGNIFICANT EVENTS / STUDIES:  9/16 - [place] abd/pelvis>>> Portal and hepatic veins are patent with normal flow. Hepatomegally, GB sludge.  9/19 - [place] abd>>>no ascites  9/19 - Echo>>>EF 45-55% diffuse hypokinesis  CULTURES:  MRSA PCR 9/16>>neg  Pleural Fluid Culture 9/20>>>   ANTIBIOTICS:  Unasyn  9/20>>>9/22  Anti-infectives   Start     Dose/Rate Route Frequency Ordered Stop   01/17/13 0900  ampicillin-sulbactam (UNASYN) 1.5 g in sodium chloride 0.9 % 50 mL IVPB  Status:  Discontinued     1.5 g 100 mL/hr over 30 Minutes Intravenous Every 6 hours 01/17/13 0819 01/19/13 1208   01/13/13 2215  cefTRIAXone (ROCEPHIN) 1 g in dextrose 5 % 50 mL IVPB     1 g 100 mL/hr over 30 Minutes Intravenous  Once 01/13/13 2209 01/14/13 0023     Antibiotics Given (last 72 hours)   None     HPI/Subjective: - very fatigued this morning.   Objective: Filed Vitals:   01/24/13 0605 01/24/13 1443 01/24/13 2250 01/25/13 0600  BP: 106/59 109/65 113/73 107/65  Pulse: 80 81 80 66  Temp: 98.9 F (37.2 C) 99.5 F (37.5 C) 99.4 F (37.4 C) 98.9 F (37.2 C)  TempSrc: Oral Oral Oral Oral  Resp: 16 18 18 20   Height:      Weight:    64.1 kg (141 lb 5 oz)  SpO2: 100% 100% 98% 97%   No intake or output data in the 24 hours ending 01/25/13 1148 Filed Weights   01/22/13 0545 01/24/13 0501 01/25/13 0600  Weight: 64.139 kg (141 lb 6.4 oz) 68.765 kg (151 lb 9.6 oz) 64.1 kg (141 lb 5 oz)    Exam:  General:  NAD, fatigued but alert and oriented, visibly jaundiced  Cardiovascular: regular rate and rhythm, without MRG  Respiratory: good air movement, clear to auscultation throughout, no wheezing, ronchi or rales  Abdomen: soft, not tender to palpation, positive bowel sounds  MSK: no peripheral edema  Neuro: CN 2-12 grossly intact, MS 5/5 in all 4  Data Reviewed: Basic Metabolic Panel:  Recent Labs Lab 01/19/13 0350  01/19/13 1546 01/20/13 0325 01/21/13 0428 01/22/13 0419 01/23/13 0430 01/24/13 0603 01/25/13 0555  NA 140 138 138 138 138 139 137  --   K 2.6* 3.8 3.5 3.1* 2.6* 3.5 2.9* 3.5  CL 101 99 101 100 100 100 97  --   CO2 29 28 29 29 29 31 29   --   GLUCOSE 97 188* 86 79 103* 100* 106*  --   BUN 8 7 7 7 8 7 8   --   CREATININE 0.56 0.55 0.52 0.61 0.56 0.71 0.60  --   CALCIUM 8.1*  8.3* 8.4 8.6 8.4 8.6 8.5  --   MG 1.4* 2.3 2.1  --  1.8  --  1.9  --   PHOS 2.7  --   --   --   --   --  3.4  --    Liver Function Tests:  Recent Labs Lab 01/21/13 0428 01/22/13 0419 01/23/13 0430 01/24/13 0603 01/25/13 0555  AST 184* 187* 200* 195* 195*  ALT 68* 68* 78* 79* 83*  ALKPHOS 146* 159* 146* 144* 136*  BILITOT 25.8* 24.4* 27.1* 27.5* 29.1*  PROT 5.2* 4.8* 4.9* 4.8* 4.9*  ALBUMIN 2.0* 1.9* 2.0* 1.9* 1.9*   CBC:  Recent Labs Lab 01/19/13 0350 01/20/13 0325 01/21/13 0428 01/22/13 0419 01/23/13 0430  WBC 9.0 8.2 7.9 8.8 9.9  HGB 11.5* 11.5* 11.3* 11.1* 11.3*  HCT 34.3* 34.3* 33.9* 33.0* 33.9*  MCV 107.5* 108.5* 109.0* 107.8* 108.0*  PLT 158 174 174 195 222   BNP (last 3 results)  Recent Labs  01/16/13 1230 01/19/13 0350  PROBNP 282.4* 41.7   CBG:  Recent Labs Lab 01/20/13 0009 01/20/13 0308 01/20/13 0747 01/20/13 1158 01/20/13 1544  GLUCAP 82 84 77 129* 146*    Recent Results (from the past 240 hour(s))  BODY FLUID CULTURE     Status: None   Collection Time    01/17/13 12:50 PM      Result Value Range Status   Specimen Description PLEURAL   Final   Special Requests Normal   Final   Gram Stain     Final   Value: NO WBC SEEN     NO ORGANISMS SEEN     Performed at [facility]   Culture     Final   Value: NO GROWTH 3 DAYS     Performed at [facility]   Report Status 01/21/2013 FINAL   Final     Studies: No results found.  Scheduled Meds: . antiseptic oral rinse  15 mL Mouth Rinse BID  . enoxaparin (LOVENOX) injection  30 mg Subcutaneous Q24H  . feeding supplement  237 mL Oral BID BM  . folic acid  1 mg Intravenous Daily   Or  . folic acid  1 mg Oral Daily  . levETIRAcetam  1,000 mg Oral BID  . methylPREDNISolone (SOLU-MEDROL) injection  40 mg Intravenous Q24H  . multivitamin with minerals  1 tablet Oral Daily  . pantoprazole  40 mg Oral BID  . sodium chloride  3 mL Intravenous Q12H  . thiamine  100 mg Oral  Daily   Or  . thiamine  100 mg Intravenous Daily   Continuous Infusions:  Principal Problem:   Hepatitis Active Problems:   Seizure disorder   Alcohol abuse   Alcohol withdrawal   Thrombocytopenia   Transaminitis   Hypokalemia   Hypomagnesemia   Ulceration, tongue traumatic   Coagulopathy   Acute pulmonary edema   Alcoholic  cardiomyopathy   Acute respiratory failure with hypoxia   Pleural effusion, bilateral   Acute liver disease  Time spent: 25  Pamella Pert, MD [facility] Pager [phone]. If 7 PM - 7 AM, please contact night-coverage at [email or web address], password [facility] 01/25/2013, 11:48 AM  LOS: 12 days

## 2013-01-26 ENCOUNTER — Inpatient Hospital Stay (HOSPITAL_COMMUNITY): Payer: BC Managed Care – PPO

## 2013-01-26 DIAGNOSIS — E43 Unspecified severe protein-calorie malnutrition: Secondary | ICD-10-CM | POA: Insufficient documentation

## 2013-01-26 LAB — URINALYSIS, ROUTINE W REFLEX MICROSCOPIC
Ketones, ur: NEGATIVE mg/dL
Leukocytes, UA: NEGATIVE
Nitrite: NEGATIVE
Protein, ur: NEGATIVE mg/dL
Urobilinogen, UA: 1 mg/dL (ref 0.0–1.0)
pH: 6.5 (ref 5.0–8.0)

## 2013-01-26 LAB — CBC
MCHC: 33.5 g/dL (ref 30.0–36.0)
Platelets: 266 10*3/uL (ref 150–400)
RBC: 3.12 MIL/uL — ABNORMAL LOW (ref 4.22–5.81)
RDW: 14.7 % (ref 11.5–15.5)
WBC: 18 10*3/uL — ABNORMAL HIGH (ref 4.0–10.5)

## 2013-01-26 LAB — HEPATIC FUNCTION PANEL
ALT: 79 U/L — ABNORMAL HIGH (ref 0–53)
Albumin: 1.8 g/dL — ABNORMAL LOW (ref 3.5–5.2)
Alkaline Phosphatase: 134 U/L — ABNORMAL HIGH (ref 39–117)
Total Protein: 4.9 g/dL — ABNORMAL LOW (ref 6.0–8.3)

## 2013-01-26 LAB — BASIC METABOLIC PANEL
CO2: 30 mEq/L (ref 19–32)
Calcium: 8.4 mg/dL (ref 8.4–10.5)
Chloride: 95 mEq/L — ABNORMAL LOW (ref 96–112)
Creatinine, Ser: 0.67 mg/dL (ref 0.50–1.35)
GFR calc Af Amer: >90 mL/min (ref >90)
GFR calc non Af Amer: >90 mL/min (ref >90)
Sodium: 135 mEq/L (ref 135–145)

## 2013-01-26 LAB — PROTIME-INR
INR: 1.27 (ref 0.00–1.49)
Prothrombin Time: 15.6 seconds — ABNORMAL HIGH (ref 11.6–15.2)

## 2013-01-26 LAB — PHOSPHORUS: Phosphorus: 3.5 mg/dL (ref 2.3–4.6)

## 2013-01-26 MED ORDER — POTASSIUM CHLORIDE CRYS ER 20 MEQ PO TBCR
40.0000 meq | EXTENDED_RELEASE_TABLET | Freq: Once | ORAL | Status: AC
  Administered 2013-01-26: 40 meq via ORAL
  Filled 2013-01-26: qty 2

## 2013-01-26 MED ORDER — BOOST / RESOURCE BREEZE PO LIQD
1.0000 | Freq: Two times a day (BID) | ORAL | Status: DC
  Administered 2013-01-26 – 2013-01-31 (×9): 1 via ORAL

## 2013-01-26 NOTE — Progress Notes (Addendum)
Subjective: No acute events.  He is eating better.  Objective: Vital signs in last 24 hours: Temp:  [98.3 F (36.8 C)-99.7 F (37.6 C)] 98.3 F (36.8 C) (09/29 1355) Pulse Rate:  [63-70] 70 (09/29 1355) Resp:  [19-20] 19 (09/29 1355) BP: (101-104)/(56-66) 104/60 mmHg (09/29 1355) SpO2:  [96 %-100 %] 100 % (09/29 1355) Weight:  [140 lb 10.5 oz (63.8 kg)] 140 lb 10.5 oz (63.8 kg) (09/29 0451) Last BM Date: 01/25/13  Intake/Output from previous day: 09/28 0701 - 09/29 0700 In: 240 [P.O.:240] Out: 325 [Urine:325] Intake/Output this shift: Total I/O In: 240 [P.O.:240] Out: 500 [Urine:500]  General appearance: alert and no distress GI: hepatomegaly, but feels smaller  Lab Results:  Recent Labs  01/26/13 0435  WBC 18.0*  HGB 11.2*  HCT 33.4*  PLT 266   BMET  Recent Labs  01/24/13 0603 01/25/13 0555 01/26/13 0435  NA 137  --  135  K 2.9* 3.5 3.3*  CL 97  --  95*  CO2 29  --  30  GLUCOSE 106*  --  106*  BUN 8  --  9  CREATININE 0.60  --  0.67  CALCIUM 8.5  --  8.4   LFT  Recent Labs  01/26/13 0435  PROT 4.9*  ALBUMIN 1.8*  AST 172*  ALT 79*  ALKPHOS 134*  BILITOT 28.3*  BILIDIR 21.6*  IBILI 6.7*   PT/INR  Recent Labs  01/24/13 0603 01/26/13 0435  LABPROT 15.9* 15.6*  INR 1.30 1.27   Hepatitis Panel No results found for this basename: HEPBSAG, HCVAB, HEPAIGM, HEPBIGM,  in the last 72 hours C-Diff No results found for this basename: CDIFFTOX,  in the last 72 hours Fecal Lactopherrin No results found for this basename: FECLLACTOFRN,  in the last 72 hours  Studies/Results: Dg Chest Port 1 View  01/26/2013   *RADIOLOGY REPORT*  Clinical Data: Cough and leukocytosis.  PORTABLE CHEST - 1 VIEW  Comparison: 01/19/2013 prior chest radiographs dating back to 05/10/2012  Findings: The cardiomediastinal silhouette is unremarkable. Mild peribronchial thickening is again noted. There is no evidence of focal airspace disease, pulmonary edema, suspicious  pulmonary nodule/mass, pleural effusion, or pneumothorax. No acute bony abnormalities are identified.  IMPRESSION: No evidence of active cardiopulmonary disease.   Original Report Authenticated By: Harmon Pier, M.D.    Medications:  Scheduled: . antiseptic oral rinse  15 mL Mouth Rinse BID  . enoxaparin (LOVENOX) injection  30 mg Subcutaneous Q24H  . feeding supplement  1 Container Oral BID BM  . folic acid  1 mg Intravenous Daily   Or  . folic acid  1 mg Oral Daily  . levETIRAcetam  1,000 mg Oral BID  . methylPREDNISolone (SOLU-MEDROL) injection  40 mg Intravenous Q24H  . multivitamin with minerals  1 tablet Oral Daily  . pantoprazole  40 mg Oral BID  . sodium chloride  3 mL Intravenous Q12H  . thiamine  100 mg Oral Daily   Or  . thiamine  100 mg Intravenous Daily   Continuous:   Assessment/Plan: 1) Severe ETOH hepatitis. 2) ETOH abuse.   Clinically he looks well, but I am concerned about his markedly elevated bilirubin.  The mainstay of therapy is nutrition and steroids.  His synthetic function is holding at 1.2.  Plan: 1) Continue with PO intake. 2) Continue with steroids.   LOS: 13 days   Amariana Mirando D 01/26/2013, 6:55 PM

## 2013-01-26 NOTE — Progress Notes (Addendum)
[facility] PROGRESS NOTE  Eric Mueller:[id number] DOB: 10-16-1985 DOA: 01/13/2013 PCP: Lucilla Edin, MD  HPI: Eric Mueller is a 27 y.o. male has a past medical history significant for alcohol abuse, with a prior hospitalization in January of 2014 for acute pancreatitis, another hospitalization in July for alcohol withdrawal seizures, presents to the emergency room with a chief complaint of yellowing of his eyes, fatigue, easy bruising, progressively over the past 2 weeks. Patient was admitted with alcoholic hepatitis and subsequently admitted to ICU for progressive acute encephalopathy/DTs. Mental status slowly improved and transferred to floor 01/20/2013.  Assessment/Plan:  Acute on chronic liver injury/alcoholic hepatitis - liver function stable today, not getting worse. Appreciate GI involvement. I am still concerned with his trajectory.  - Continues to be fatigued and does not appear to have good po intake.  - continue supportive treatment. Close monitoring for mental status changes.  Severe malnutrition - appreciate nutrition help Leukocytosis - afebrile, no evidence of infection, may be due to steroids - will however obtain UA and CXR Seizure-continue keppra. 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 No bleeding.  ETOH abuse/Severe DTs  resolved  DVT Prophylaxis - SCDs. Lovenox.  Code Status: Full Family Communication: parents last night Disposition Plan: inpatient  Consultants:  GI  SIGNIFICANT EVENTS / STUDIES:  9/16 - [place] abd/pelvis>>> Portal and hepatic veins are patent with normal flow. Hepatomegally, GB sludge.  9/19 - [place] abd>>>no ascites  9/19 - Echo>>>EF 45-55% diffuse hypokinesis  CULTURES:  MRSA PCR 9/16>>neg  Pleural Fluid Culture 9/20>>>   ANTIBIOTICS:   Unasyn 9/20>>>9/22  Anti-infectives   Start     Dose/Rate Route Frequency Ordered Stop   01/17/13 0900  ampicillin-sulbactam (UNASYN) 1.5 g in sodium chloride 0.9 % 50 mL IVPB  Status:  Discontinued     1.5 g 100 mL/hr over 30 Minutes Intravenous Every 6 hours 01/17/13 0819 01/19/13 1208   01/13/13 2215  cefTRIAXone (ROCEPHIN) 1 g in dextrose 5 % 50 mL IVPB     1 g 100 mL/hr over 30 Minutes Intravenous  Once 01/13/13 2209 01/14/13 0023     Antibiotics Given (last 72 hours)   None     HPI/Subjective: - denies specific complaints, flat affect this morning  Objective: Filed Vitals:   01/25/13 1300 01/25/13 2052 01/26/13 0451 01/26/13 0456  BP: 108/77 103/66  101/56  Pulse: 70 68  63  Temp: 98.6 F (37 C) 99.7 F (37.6 C)  98.6 F (37 C)  TempSrc: Oral Oral  Oral  Resp: 15 20  20   Height:      Weight:   63.8 kg (140 lb 10.5 oz)   SpO2: 99% 96%  100%    Intake/Output Summary (Last 24 hours) at 01/26/13 0842 Last data filed at 01/26/13 0450  Gross per 24 hour  Intake    240 ml  Output    325 ml  Net    -85 ml   Filed Weights   01/24/13 0501 01/25/13 0600 01/26/13 0451  Weight: 68.765 kg (151 lb 9.6 oz) 64.1 kg (141 lb 5 oz) 63.8 kg (140 lb 10.5 oz)   Exam:  General:  NAD, fatigued but alert and oriented, visibly jaundiced  Cardiovascular: regular rate and rhythm, without MRG  Respiratory: good air movement, clear to auscultation throughout, no wheezing, ronchi or rales  Abdomen: soft, not  tender to palpation, positive bowel sounds  MSK: no peripheral edema  Neuro: no asterixis, non focal  Data Reviewed: Basic Metabolic Panel:  Recent Labs Lab 01/19/13 1546 01/20/13 0325 01/21/13 0428 01/22/13 0419 01/23/13 0430 01/24/13 0603 01/25/13 0555 01/26/13 0435  NA 138 138 138 138 139 137  --  135  K 3.8 3.5 3.1* 2.6* 3.5 2.9* 3.5 3.3*  CL 99 101 100 100 100 97  --  95*  CO2 28 29 29 29 31 29   --  30  GLUCOSE 188* 86 79 103* 100* 106*  --  106*  BUN 7  7 7 8 7 8   --  9  CREATININE 0.55 0.52 0.61 0.56 0.71 0.60  --  0.67  CALCIUM 8.3* 8.4 8.6 8.4 8.6 8.5  --  8.4  MG 2.3 2.1  --  1.8  --  1.9  --  1.9  PHOS  --   --   --   --   --  3.4  --  3.5   Liver Function Tests:  Recent Labs Lab 01/22/13 0419 01/23/13 0430 01/24/13 0603 01/25/13 0555 01/26/13 0435  AST 187* 200* 195* 195* 172*  ALT 68* 78* 79* 83* 79*  ALKPHOS 159* 146* 144* 136* 134*  BILITOT 24.4* 27.1* 27.5* 29.1* 28.3*  PROT 4.8* 4.9* 4.8* 4.9* 4.9*  ALBUMIN 1.9* 2.0* 1.9* 1.9* 1.8*   CBC:  Recent Labs Lab 01/20/13 0325 01/21/13 0428 01/22/13 0419 01/23/13 0430 01/26/13 0435  WBC 8.2 7.9 8.8 9.9 18.0*  HGB 11.5* 11.3* 11.1* 11.3* 11.2*  HCT 34.3* 33.9* 33.0* 33.9* 33.4*  MCV 108.5* 109.0* 107.8* 108.0* 107.1*  PLT 174 174 195 222 266   BNP (last 3 results)  Recent Labs  01/16/13 1230 01/19/13 0350  PROBNP 282.4* 41.7   CBG:  Recent Labs Lab 01/20/13 0009 01/20/13 0308 01/20/13 0747 01/20/13 1158 01/20/13 1544  GLUCAP 82 84 77 129* 146*    Recent Results (from the past 240 hour(s))  BODY FLUID CULTURE     Status: None   Collection Time    01/17/13 12:50 PM      Result Value Range Status   Specimen Description PLEURAL   Final   Special Requests Normal   Final   Gram Stain     Final   Value: NO WBC SEEN     NO ORGANISMS SEEN     Performed at [facility]   Culture     Final   Value: NO GROWTH 3 DAYS     Performed at [facility]   Report Status 01/21/2013 FINAL   Final     Studies: No results found.  Scheduled Meds: . antiseptic oral rinse  15 mL Mouth Rinse BID  . enoxaparin (LOVENOX) injection  30 mg Subcutaneous Q24H  . feeding supplement  237 mL Oral BID BM  . folic acid  1 mg Intravenous Daily   Or  . folic acid  1 mg Oral Daily  . levETIRAcetam  1,000 mg Oral BID  . methylPREDNISolone (SOLU-MEDROL) injection  40 mg Intravenous Q24H  . multivitamin with minerals  1 tablet Oral Daily  . pantoprazole  40  mg Oral BID  . potassium chloride  40 mEq Oral Once  . sodium chloride  3 mL Intravenous Q12H  . thiamine  100 mg Oral Daily   Or  . thiamine  100 mg Intravenous Daily   Continuous Infusions:  Principal Problem:   Hepatitis Active Problems:  Seizure disorder   Alcohol abuse   Alcohol withdrawal   Thrombocytopenia   Transaminitis   Hypokalemia   Hypomagnesemia   Ulceration, tongue traumatic   Coagulopathy   Acute pulmonary edema   Alcoholic cardiomyopathy   Acute respiratory failure with hypoxia   Pleural effusion, bilateral   Acute liver disease  Time spent: 15  Pamella Pert, MD [facility] Pager [phone]. If 7 PM - 7 AM, please contact night-coverage at [email or web address], password [facility] 01/26/2013, 8:42 AM  LOS: 13 days

## 2013-01-26 NOTE — Progress Notes (Addendum)
INITIAL NUTRITION ASSESSMENT  DOCUMENTATION CODES Per approved criteria  -Severe malnutrition in the context of acute illness   INTERVENTION: Add Resource Breeze po BID, each supplement provides 250 kcal and 9 grams of protein. Discontinue Ensure Complete. Agree with Regular, liberalized diet.  Will continue to follow and discussed alternative oral nutrition supplements, however if intake does not improve, recommend initiation of short-term nutrition support. RD provided information on High Calorie, High Protein diet; reviewed with mom at bedside. RD to continue to follow nutrition care plan.  NUTRITION DIAGNOSIS: Inadequate oral intake related to poor appetite as evidenced by poor meal intake and ongoing weight loss.   Goal: Intake to meet >90% of estimated nutrition needs.  Monitor:  weight trends, lab trends, I/O's, PO intake, supplement tolerance  Reason for Assessment: MD Consult  27 y.o. male  Admitting Dx: Hepatitis  ASSESSMENT: PMHx significant for ETOH abuse, pancreatitis, seizures. Admitted with several falls, jaundice and poor appetite. Work-up reveals acute on chronic liver injury.  Pt underwent severe DTs and was transferred to ICU. Developed progressive hypoxia, pulmonary edema and pleural effusions while in ICU.  Underwent thoracentesis of bilateral pleural effusions on 9/20. Transferred to telemetry 9/23.  Began to develop worsening n/v on 9/24. Pt with worsening LFT's.  Pt with 7% wt loss since admission. Pt has been on a Regular diet x 8 days. Meal intake appears to be variable. Pt doesn't really care for the hospital foods, has been eating bites of what is being brought to him (discussed this with mom outside of room.) Was able to eat 100% of a peanut butter sandwich that his mom brought for him this morning. Did not eat much at home either, per mom. Pt with Ensure Completes at bedside, states that they are the "bain of his existence."  Discussed other  supplement options such as [name], pt asking about [name], stated that these are ok if family wants to provide them. Pt meets criteria for severe MALNUTRITION in the context of chronic illness as evidenced by 7% wt loss x <2 weeks and intake of <75% x at least 1 month.  Potassium is currently low. Most recent magnesium and phosphorus WNL.  Height: Ht Readings from Last 1 Encounters:  01/15/13 5\' 11"  (1.803 m)    Weight: Wt Readings from Last 1 Encounters:  01/26/13 140 lb 10.5 oz (63.8 kg)    Ideal Body Weight: 172 lb  % Ideal Body Weight: 81%  Wt Readings from Last 10 Encounters:  01/26/13 140 lb 10.5 oz (63.8 kg)  11/27/12 150 lb (68.04 kg)  09/18/12 149 lb 6.4 oz (67.767 kg)  05/09/12 152 lb 12.5 oz (69.3 kg)    Usual Body Weight: 150 lb  % Usual Body Weight: 93%  BMI:  Body mass index is 19.63 kg/(m^2). WNL  Estimated Nutritional Needs: Kcal: 1950-2150 Protein: at least 83 grams daily Fluid: 2 liters daily  Skin: intact  Diet Order: General  EDUCATION NEEDS: -Education needs addressed   Intake/Output Summary (Last 24 hours) at 01/26/13 1021 Last data filed at 01/26/13 0450  Gross per 24 hour  Intake    240 ml  Output    325 ml  Net    -85 ml    Last BM: 9/28   Labs:   Recent Labs Lab 01/22/13 0419 01/23/13 0430 01/24/13 0603 01/25/13 0555 01/26/13 0435  NA 138 139 137  --  135  K 2.6* 3.5 2.9* 3.5 3.3*  CL 100 100 97  --  95*  CO2 29 31 29   --  30  BUN 8 7 8   --  9  CREATININE 0.56 0.71 0.60  --  0.67  CALCIUM 8.4 8.6 8.5  --  8.4  MG 1.8  --  1.9  --  1.9  PHOS  --   --  3.4  --  3.5  GLUCOSE 103* 100* 106*  --  106*    CBG (last 3)  No results found for this basename: GLUCAP,  in the last 72 hours  Scheduled Meds: . antiseptic oral rinse  15 mL Mouth Rinse BID  . enoxaparin (LOVENOX) injection  30 mg Subcutaneous Q24H  . feeding supplement  237 mL Oral BID BM  . folic acid  1 mg Intravenous Daily   Or   . folic acid  1 mg Oral Daily  . levETIRAcetam  1,000 mg Oral BID  . methylPREDNISolone (SOLU-MEDROL) injection  40 mg Intravenous Q24H  . multivitamin with minerals  1 tablet Oral Daily  . pantoprazole  40 mg Oral BID  . potassium chloride  40 mEq Oral Once  . sodium chloride  3 mL Intravenous Q12H  . thiamine  100 mg Oral Daily   Or  . thiamine  100 mg Intravenous Daily    Continuous Infusions:   Past Medical History  Diagnosis Date  . Seizure disorder   . Seizures   . Allergy   . Anxiety     Past Surgical History  Procedure Laterality Date  . Wisdom tooth extraction      Eric Motto MS, RD, LDN Pager: [phone] After-hours pager: [phone]

## 2013-01-27 DIAGNOSIS — K7010 Alcoholic hepatitis without ascites: Principal | ICD-10-CM

## 2013-01-27 LAB — CBC WITH DIFFERENTIAL/PLATELET
Basophils Absolute: 0.1 10*3/uL (ref 0.0–0.1)
Basophils Relative: 0 % (ref 0–1)
Eosinophils Absolute: 0.1 10*3/uL (ref 0.0–0.7)
Eosinophils Relative: 1 % (ref 0–5)
MCH: 36.1 pg — ABNORMAL HIGH (ref 26.0–34.0)
MCHC: 33.9 g/dL (ref 30.0–36.0)
MCV: 106.5 fL — ABNORMAL HIGH (ref 78.0–100.0)
Neutro Abs: 13.5 10*3/uL — ABNORMAL HIGH (ref 1.7–7.7)
Neutrophils Relative %: 80 % — ABNORMAL HIGH (ref 43–77)
Platelets: 290 10*3/uL (ref 150–400)
RBC: 3.1 MIL/uL — ABNORMAL LOW (ref 4.22–5.81)
RDW: 14.3 % (ref 11.5–15.5)

## 2013-01-27 LAB — POTASSIUM: Potassium: 3 mEq/L — ABNORMAL LOW (ref 3.5–5.1)

## 2013-01-27 LAB — HEPATIC FUNCTION PANEL
ALT: 76 U/L — ABNORMAL HIGH (ref 0–53)
Albumin: 1.8 g/dL — ABNORMAL LOW (ref 3.5–5.2)
Indirect Bilirubin: 7 mg/dL — ABNORMAL HIGH (ref 0.3–0.9)
Total Protein: 5 g/dL — ABNORMAL LOW (ref 6.0–8.3)

## 2013-01-27 MED ORDER — DIPHENHYDRAMINE HCL 50 MG/ML IJ SOLN
25.0000 mg | Freq: Once | INTRAMUSCULAR | Status: DC
  Filled 2013-01-27: qty 1

## 2013-01-27 MED ORDER — KETOROLAC TROMETHAMINE 30 MG/ML IJ SOLN: 30.0000 mg | Freq: Once | INTRAMUSCULAR | Status: AC

## 2013-01-27 MED ORDER — LACTULOSE 10 GM/15ML PO SOLN
10.0000 g | Freq: Two times a day (BID) | ORAL | Status: DC
  Administered 2013-01-27 – 2013-01-28 (×3): 10 g via ORAL
  Filled 2013-01-27 (×4): qty 15

## 2013-01-27 MED ORDER — FENTANYL CITRATE 0.05 MG/ML IJ SOLN
12.5000 ug | Freq: Four times a day (QID) | INTRAMUSCULAR | Status: DC | PRN
  Administered 2013-01-27 – 2013-01-30 (×2): 12.5 ug via INTRAVENOUS
  Filled 2013-01-27 (×2): qty 2

## 2013-01-27 MED ORDER — METOCLOPRAMIDE HCL 5 MG/ML IJ SOLN: 10.0000 mg | Freq: Once | INTRAMUSCULAR | Status: DC

## 2013-01-27 MED ORDER — POTASSIUM CHLORIDE CRYS ER 20 MEQ PO TBCR
40.0000 meq | EXTENDED_RELEASE_TABLET | Freq: Two times a day (BID) | ORAL | Status: AC
  Administered 2013-01-27 (×2): 40 meq via ORAL
  Filled 2013-01-27 (×2): qty 2

## 2013-01-27 MED ORDER — POTASSIUM CHLORIDE 20 MEQ PO PACK: 40.0000 meq | PACK | Freq: Two times a day (BID) | ORAL | Status: DC

## 2013-01-27 MED ORDER — ENOXAPARIN SODIUM 40 MG/0.4ML ~~LOC~~ SOLN
40.0000 mg | SUBCUTANEOUS | Status: DC
  Administered 2013-01-27 – 2013-02-02 (×7): 40 mg via SUBCUTANEOUS
  Filled 2013-01-27 (×7): qty 0.4

## 2013-01-27 NOTE — Progress Notes (Signed)
Subjective: Feeling well.  Objective: Vital signs in last 24 hours: Temp:  [98.3 F (36.8 C)-98.9 F (37.2 C)] 98.9 F (37.2 C) (09/30 0537) Pulse Rate:  [51-70] 51 (09/30 0537) Resp:  [18-19] 18 (09/30 0537) BP: (102-110)/(60-73) 102/62 mmHg (09/30 0537) SpO2:  [99 %-100 %] 99 % (09/30 0537) Weight:  [140 lb 6.9 oz (63.7 kg)] 140 lb 6.9 oz (63.7 kg) (09/30 0537) Last BM Date: 01/26/13  Intake/Output from previous day: 09/29 0701 - 09/30 0700 In: 480 [P.O.:480] Out: 500 [Urine:500] Intake/Output this shift:    General appearance: alert and no distress GI: hepatomegaly  Lab Results:  Recent Labs  01/26/13 0435 01/27/13 0455  WBC 18.0* 16.8*  HGB 11.2* 11.2*  HCT 33.4* 33.0*  PLT 266 290   BMET  Recent Labs  01/25/13 0555 01/26/13 0435 01/27/13 0455  NA  --  135  --   K 3.5 3.3* 3.0*  CL  --  95*  --   CO2  --  30  --   GLUCOSE  --  106*  --   BUN  --  9  --   CREATININE  --  0.67  --   CALCIUM  --  8.4  --    LFT  Recent Labs  01/27/13 0455  PROT 5.0*  ALBUMIN 1.8*  AST 148*  ALT 76*  ALKPHOS 135*  BILITOT 28.9*  BILIDIR 21.9*  IBILI 7.0*   PT/INR  Recent Labs  01/26/13 0435  LABPROT 15.6*  INR 1.27   Hepatitis Panel No results found for this basename: HEPBSAG, HCVAB, HEPAIGM, HEPBIGM,  in the last 72 hours C-Diff No results found for this basename: CDIFFTOX,  in the last 72 hours Fecal Lactopherrin No results found for this basename: FECLLACTOFRN,  in the last 72 hours  Studies/Results: Dg Chest Port 1 View  01/26/2013   *RADIOLOGY REPORT*  Clinical Data: Cough and leukocytosis.  PORTABLE CHEST - 1 VIEW  Comparison: 01/19/2013 prior chest radiographs dating back to 05/10/2012  Findings: The cardiomediastinal silhouette is unremarkable. Mild peribronchial thickening is again noted. There is no evidence of focal airspace disease, pulmonary edema, suspicious pulmonary nodule/mass, pleural effusion, or pneumothorax. No acute bony  abnormalities are identified.  IMPRESSION: No evidence of active cardiopulmonary disease.   Original Report Authenticated By: Harmon Pier, M.D.    Medications:  Scheduled: . antiseptic oral rinse  15 mL Mouth Rinse BID  . enoxaparin (LOVENOX) injection  40 mg Subcutaneous Q24H  . feeding supplement  1 Container Oral BID BM  . folic acid  1 mg Intravenous Daily   Or  . folic acid  1 mg Oral Daily  . levETIRAcetam  1,000 mg Oral BID  . methylPREDNISolone (SOLU-MEDROL) injection  40 mg Intravenous Q24H  . multivitamin with minerals  1 tablet Oral Daily  . pantoprazole  40 mg Oral BID  . potassium chloride  40 mEq Oral BID  . sodium chloride  3 mL Intravenous Q12H  . thiamine  100 mg Oral Daily   Or  . thiamine  100 mg Intravenous Daily   Continuous:   Assessment/Plan: 1) Severe ETOH Hepatitis. 2) ETOH abuse.   No significant change with his bilirubin.  Clinically he appears to be stable.  His [place] score is at 0.98, which is a score for responsiveness to steroids.  A score >.45 shows that he is not responding to steroids and his 6 month mortality risk is 75%.  Pentoxifylline is not helpful of the patient is  not responding to steroids, however, he will be maintained on the medication.  Plan: 1) Continue with nutritional support. 2) Continue with steroids.  LOS: 14 days   Eric Mueller D 01/27/2013, 12:22 PM

## 2013-01-27 NOTE — Progress Notes (Signed)
[facility] PROGRESS NOTE  Eric Mueller ZOX:[id number] DOB: 1985-12-29 DOA: 01/13/2013 PCP: Lucilla Edin, MD  HPI: Eric Mueller is a 27 y.o. male has a past medical history significant for alcohol abuse, with a prior hospitalization in January of 2014 for acute pancreatitis, another hospitalization in July for alcohol withdrawal seizures, presents to the emergency room with a chief complaint of yellowing of his eyes, fatigue, easy bruising, progressively over the past 2 weeks. Patient was admitted with alcoholic hepatitis and subsequently admitted to ICU for progressive acute encephalopathy/DTs. Mental status slowly improved and transferred to floor 01/20/2013.  Assessment/Plan:  Acute on chronic liver injury/alcoholic hepatitis - I am still concerned with his trajectory. Appreciate GI input, seems like he is not responding to steroids.  - Continues to be fatigued and does not appear to have good po intake..  - feeling more unsteady on his feet today, his mother appreciates very minimal confusion. Start lactulose. Severe malnutrition - appreciate nutrition help Leukocytosis - afebrile, no evidence of infection, may be due to steroids - UA and CXR unremarkable Seizure-continue keppra. 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 No bleeding.  ETOH abuse/Severe DTs  resolved  DVT Prophylaxis - SCDs. Lovenox.  Code Status: Full Family Communication: parents last night Disposition Plan: inpatient  Consultants:  GI  SIGNIFICANT EVENTS / STUDIES:  9/16 - [place] abd/pelvis>>> Portal and hepatic veins are patent with normal flow. Hepatomegally, GB sludge.  9/19 - [place] abd>>>no ascites  9/19 - Echo>>>EF 45-55% diffuse hypokinesis  CULTURES:  MRSA PCR 9/16>>neg  Pleural Fluid Culture 9/20>>>    ANTIBIOTICS:  Unasyn 9/20>>>9/22  Anti-infectives   Start     Dose/Rate Route Frequency Ordered Stop   01/17/13 0900  ampicillin-sulbactam (UNASYN) 1.5 g in sodium chloride 0.9 % 50 mL IVPB  Status:  Discontinued     1.5 g 100 mL/hr over 30 Minutes Intravenous Every 6 hours 01/17/13 0819 01/19/13 1208   01/13/13 2215  cefTRIAXone (ROCEPHIN) 1 g in dextrose 5 % 50 mL IVPB     1 g 100 mL/hr over 30 Minutes Intravenous  Once 01/13/13 2209 01/14/13 0023     Antibiotics Given (last 72 hours)   None     HPI/Subjective: - continue to be fatigued  Objective: Filed Vitals:   01/26/13 0456 01/26/13 1355 01/26/13 2113 01/27/13 0537  BP: 101/56 104/60 110/73 102/62  Pulse: 63 70 62 51  Temp: 98.6 F (37 C) 98.3 F (36.8 C) 98.9 F (37.2 C) 98.9 F (37.2 C)  TempSrc: Oral Oral Oral Oral  Resp: 20 19 18 18   Height:      Weight:    63.7 kg (140 lb 6.9 oz)  SpO2: 100% 100% 100% 99%    Intake/Output Summary (Last 24 hours) at 01/27/13 0718 Last data filed at 01/26/13 1636  Gross per 24 hour  Intake    240 ml  Output    500 ml  Net   -260 ml   Filed Weights   01/25/13 0600 01/26/13 0451 01/27/13 0537  Weight: 64.1 kg (141 lb 5 oz) 63.8 kg (140 lb 10.5 oz) 63.7 kg (140 lb 6.9 oz)   Exam:  General:  NAD, fatigued but alert and oriented, visibly jaundiced  Cardiovascular: regular rate and rhythm, without MRG  Respiratory: good air movement, clear to auscultation throughout, no wheezing, ronchi or rales  Abdomen: soft, not tender to palpation, positive bowel sounds  MSK: no peripheral edema  Neuro: no asterixis, non focal  Data Reviewed: Basic Metabolic Panel:  Recent Labs Lab 01/21/13 0428 01/22/13 0419 01/23/13 0430 01/24/13 0603 01/25/13 0555 01/26/13 0435 01/27/13 0455  NA 138 138 139 137  --  135  --   K 3.1* 2.6* 3.5 2.9* 3.5 3.3* 3.0*  CL 100 100 100 97  --  95*  --   CO2 29 29 31 29   --  30  --   GLUCOSE 79 103* 100* 106*  --  106*  --   BUN 7 8 7  8   --  9  --   CREATININE 0.61 0.56 0.71 0.60  --  0.67  --   CALCIUM 8.6 8.4 8.6 8.5  --  8.4  --   MG  --  1.8  --  1.9  --  1.9  --   PHOS  --   --   --  3.4  --  3.5  --    Liver Function Tests:  Recent Labs Lab 01/23/13 0430 01/24/13 0603 01/25/13 0555 01/26/13 0435 01/27/13 0455  AST 200* 195* 195* 172* 148*  ALT 78* 79* 83* 79* 76*  ALKPHOS 146* 144* 136* 134* 135*  BILITOT 27.1* 27.5* 29.1* 28.3* 28.9*  PROT 4.9* 4.8* 4.9* 4.9* 5.0*  ALBUMIN 2.0* 1.9* 1.9* 1.8* 1.8*   CBC:  Recent Labs Lab 01/21/13 0428 01/22/13 0419 01/23/13 0430 01/26/13 0435 01/27/13 0455  WBC 7.9 8.8 9.9 18.0* 16.8*  NEUTROABS  --   --   --   --  13.5*  HGB 11.3* 11.1* 11.3* 11.2* 11.2*  HCT 33.9* 33.0* 33.9* 33.4* 33.0*  MCV 109.0* 107.8* 108.0* 107.1* 106.5*  PLT 174 195 222 266 290   BNP (last 3 results)  Recent Labs  01/16/13 1230 01/19/13 0350  PROBNP 282.4* 41.7   CBG:  Recent Labs Lab 01/20/13 0747 01/20/13 1158 01/20/13 1544  GLUCAP 77 129* 146*    Recent Results (from the past 240 hour(s))  BODY FLUID CULTURE     Status: None   Collection Time    01/17/13 12:50 PM      Result Value Range Status   Specimen Description PLEURAL   Final   Special Requests Normal   Final   Gram Stain     Final   Value: NO WBC SEEN     NO ORGANISMS SEEN     Performed at [facility]   Culture     Final   Value: NO GROWTH 3 DAYS     Performed at [facility]   Report Status 01/21/2013 FINAL   Final     Studies: Dg Chest Port 1 View  01/26/2013   *RADIOLOGY REPORT*  Clinical Data: Cough and leukocytosis.  PORTABLE CHEST - 1 VIEW  Comparison: 01/19/2013 prior chest radiographs dating back to 05/10/2012  Findings: The cardiomediastinal silhouette is unremarkable. Mild peribronchial thickening is again noted. There is no evidence of focal airspace disease, pulmonary edema, suspicious pulmonary nodule/mass, pleural effusion, or pneumothorax. No acute bony  abnormalities are identified.  IMPRESSION: No evidence of active cardiopulmonary disease.   Original Report Authenticated By: Harmon Pier, M.D.    Scheduled Meds: . antiseptic oral rinse  15 mL Mouth Rinse BID  . enoxaparin (LOVENOX) injection  30 mg Subcutaneous Q24H  . feeding supplement  1 Container Oral BID BM  . folic acid  1 mg Intravenous Daily  Or  . folic acid  1 mg Oral Daily  . levETIRAcetam  1,000 mg Oral BID  . methylPREDNISolone (SOLU-MEDROL) injection  40 mg Intravenous Q24H  . multivitamin with minerals  1 tablet Oral Daily  . pantoprazole  40 mg Oral BID  . sodium chloride  3 mL Intravenous Q12H  . thiamine  100 mg Oral Daily   Or  . thiamine  100 mg Intravenous Daily   Continuous Infusions:  Principal Problem:   Hepatitis Active Problems:   Seizure disorder   Alcohol abuse   Alcohol withdrawal   Thrombocytopenia   Transaminitis   Hypokalemia   Hypomagnesemia   Ulceration, tongue traumatic   Coagulopathy   Acute pulmonary edema   Alcoholic cardiomyopathy   Acute respiratory failure with hypoxia   Pleural effusion, bilateral   Acute liver disease   Protein-calorie malnutrition, severe  Time spent: 25  Pamella Pert, MD [facility] Pager [phone]. If 7 PM - 7 AM, please contact night-coverage at [email or web address], password [email or web address] 01/27/2013, 7:18 AM  LOS: 14 days

## 2013-01-27 NOTE — Progress Notes (Signed)
NUTRITION FOLLOW-UP  DOCUMENTATION CODES Per approved criteria  -Severe malnutrition in the context of acute illness   INTERVENTION: Continue Resource Breeze po BID, each supplement provides 250 kcal and 9 grams of protein. Discontinue Ensure Complete. Agree with Regular, liberalized diet. Oral intake appears to be improving daily. RD to continue to follow nutrition care plan.  NUTRITION DIAGNOSIS: Inadequate oral intake related to poor appetite as evidenced by poor meal intake and ongoing weight loss. Improved.  Goal: Intake to meet >90% of estimated nutrition needs. Improved.  Monitor:  weight trends, lab trends, I/O's, PO intake, supplement tolerance  ASSESSMENT: PMHx significant for ETOH abuse, pancreatitis, seizures. Admitted with several falls, jaundice and poor appetite. Work-up reveals acute on chronic liver injury. Pt underwent severe DTs and was transferred to ICU. Developed progressive hypoxia, pulmonary edema and pleural effusions while in ICU.  Underwent thoracentesis of bilateral pleural effusions on 9/20. Transferred to telemetry 9/23. Began to develop worsening n/v on 9/24. Pt with worsening LFT's.  Pt ate much better yesterday! Confirmed with nurse tech - pt consumed entire salad yesterday, had grilled chicken [employer] for dinner, and is consuming [name] mixed with Gingerale (confirms he prefers this to Ensure.) Encouraged pt to continue with supplements and variety in meal intake. Typically does not eat breakfast 2/2 sleepiness with Ativan.  Pt meets criteria for severe MALNUTRITION in the context of chronic illness as evidenced by 7% wt loss x <2 weeks and intake of <75% x at least 1 month.  Potassium is currently low. Most recent magnesium and phosphorus WNL.  Height: Ht Readings from Last 1 Encounters:  01/15/13 5\' 11"  (1.803 m)    Weight: Wt Readings from Last 1 Encounters:  01/27/13 140 lb 6.9 oz (63.7 kg)  Admit wt 151 lb.  BMI:  Body mass index is  19.6 kg/(m^2). WNL  Estimated Nutritional Needs: Kcal: 1950-2150 Protein: at least 83 grams daily Fluid: 2 liters daily  Skin: intact  Diet Order: General  EDUCATION NEEDS: -Education needs addressed   Intake/Output Summary (Last 24 hours) at 01/27/13 1029 Last data filed at 01/26/13 2145  Gross per 24 hour  Intake    480 ml  Output    500 ml  Net    -20 ml    Last BM: 9/29  Labs:   Recent Labs Lab 01/22/13 0419 01/23/13 0430 01/24/13 0603 01/25/13 0555 01/26/13 0435 01/27/13 0455  NA 138 139 137  --  135  --   K 2.6* 3.5 2.9* 3.5 3.3* 3.0*  CL 100 100 97  --  95*  --   CO2 29 31 29   --  30  --   BUN 8 7 8   --  9  --   CREATININE 0.56 0.71 0.60  --  0.67  --   CALCIUM 8.4 8.6 8.5  --  8.4  --   MG 1.8  --  1.9  --  1.9  --   PHOS  --   --  3.4  --  3.5  --   GLUCOSE 103* 100* 106*  --  106*  --     CBG (last 3)  No results found for this basename: GLUCAP,  in the last 72 hours  Scheduled Meds: . antiseptic oral rinse  15 mL Mouth Rinse BID  . enoxaparin (LOVENOX) injection  40 mg Subcutaneous Q24H  . feeding supplement  1 Container Oral BID BM  . folic acid  1 mg Intravenous Daily   Or  .  folic acid  1 mg Oral Daily  . levETIRAcetam  1,000 mg Oral BID  . methylPREDNISolone (SOLU-MEDROL) injection  40 mg Intravenous Q24H  . multivitamin with minerals  1 tablet Oral Daily  . pantoprazole  40 mg Oral BID  . potassium chloride  40 mEq Oral BID  . sodium chloride  3 mL Intravenous Q12H  . thiamine  100 mg Oral Daily   Or  . thiamine  100 mg Intravenous Daily    Continuous Infusions:  none  Jarold Motto MS, RD, LDN Pager: [phone] After-hours pager: [phone]

## 2013-01-27 NOTE — Progress Notes (Signed)
Clinical Social Work  CSW met with patient at bedside. Patient was sitting in chair and watching TV when CSW arrived. Patient reports he is feeling better and has more of an appetite. Patient was reviewing menu in order to determine what he wanted to order for lunch.   Patient reports he has been in the hospital for several days and is unsure of DC date. Patient reports he has been trying to stay busy in order to keep his spirits up and stay motivated to get better. Patient reports he has been reading books and his parents have been visiting on a daily basis. Patient feels that going outside would help his mood as well. CSW explained that MD has to approve leaving the unit and unsure if it would be allowed. Patient reports he will discuss with RN.  CSW attempted to discuss substance abuse treatment options with patient but patient continues to report he is undecided on plans. Patient wants to wait until closer to DC to consider options. CSW will continue to follow and will assist as needed.  [place], [place] [phone]

## 2013-01-28 DIAGNOSIS — K769 Liver disease, unspecified: Secondary | ICD-10-CM

## 2013-01-28 DIAGNOSIS — K859 Acute pancreatitis without necrosis or infection, unspecified: Secondary | ICD-10-CM

## 2013-01-28 LAB — CBC
Hemoglobin: 11.3 g/dL — ABNORMAL LOW (ref 13.0–17.0)
MCV: 107 fL — ABNORMAL HIGH (ref 78.0–100.0)
Platelets: 293 10*3/uL (ref 150–400)
RDW: 14 % (ref 11.5–15.5)
WBC: 17.9 10*3/uL — ABNORMAL HIGH (ref 4.0–10.5)

## 2013-01-28 LAB — COMPREHENSIVE METABOLIC PANEL
AST: 149 U/L — ABNORMAL HIGH (ref 0–37)
Albumin: 1.9 g/dL — ABNORMAL LOW (ref 3.5–5.2)
BUN: 9 mg/dL (ref 6–23)
Calcium: 8.6 mg/dL (ref 8.4–10.5)
Chloride: 97 mEq/L (ref 96–112)
Creatinine, Ser: 0.66 mg/dL (ref 0.50–1.35)
GFR calc Af Amer: >90 mL/min (ref >90)
Glucose, Bld: 101 mg/dL — ABNORMAL HIGH (ref 70–99)
Total Bilirubin: 28.6 mg/dL (ref 0.3–1.2)

## 2013-01-28 LAB — PHOSPHORUS: Phosphorus: 3.1 mg/dL (ref 2.3–4.6)

## 2013-01-28 LAB — MAGNESIUM: Magnesium: 2.1 mg/dL (ref 1.5–2.5)

## 2013-01-28 LAB — PROTIME-INR: INR: 1.2 (ref 0.00–1.49)

## 2013-01-28 MED ORDER — POTASSIUM CHLORIDE CRYS ER 20 MEQ PO TBCR
40.0000 meq | EXTENDED_RELEASE_TABLET | Freq: Once | ORAL | Status: AC
  Administered 2013-01-31: 09:00:00 40 meq via ORAL
  Filled 2013-01-28: qty 2

## 2013-01-28 MED ORDER — LACTULOSE 10 GM/15ML PO SOLN
10.0000 g | Freq: Four times a day (QID) | ORAL | Status: DC
  Administered 2013-01-28 – 2013-02-02 (×20): 10 g via ORAL
  Filled 2013-01-28 (×22): qty 15

## 2013-01-28 MED ORDER — RIFAXIMIN 550 MG PO TABS
550.0000 mg | ORAL_TABLET | Freq: Three times a day (TID) | ORAL | Status: DC
  Administered 2013-01-28 – 2013-02-02 (×14): 550 mg via ORAL
  Filled 2013-01-28 (×16): qty 1

## 2013-01-28 NOTE — Progress Notes (Signed)
Unassigned patient Subjective: Since I last evaluated the patient, he seems to be less jaundiced even though his TB is not much lower. He however feels his coordination is "out of line' His aunt is at the bedside and I am told his mother also visited him this afternoon. He has been ambulating in the hallways but is very fatigued.   Objective: Vital signs in last 24 hours: Temp:  [98.1 F (36.7 C)-99 F (37.2 C)] 98.4 F (36.9 C) (10/01 1420) Pulse Rate:  [48-58] 55 (10/01 1420) Resp:  [18] 18 (10/01 1420) BP: (95-116)/(60-71) 116/70 mmHg (10/01 1420) SpO2:  [98 %-100 %] 100 % (10/01 1420) Last BM Date: 01/27/13  Intake/Output from previous day: 09/30 0701 - 10/01 0700 In: 1200 [P.O.:1200] Out: 570 [Urine:570] Intake/Output this shift: Total I/O In: 480 [P.O.:480] Out: 250 [Urine:250]  General appearance: cooperative, appears stated age, fatigued, icteric, no distress and slowed mentation; asterixis present Resp: clear to auscultation bilaterally Cardio: regular rate and rhythm, S1, S2 normal, no murmur, click, rub or gallop GI: soft, non-tender; bowel sounds normal; no masses,  no organomegaly Extremities: extremities normal, atraumatic, no cyanosis or edema  Lab Results:  Recent Labs  01/26/13 0435 01/27/13 0455 01/28/13 0500  WBC 18.0* 16.8* 17.9*  HGB 11.2* 11.2* 11.3*  HCT 33.4* 33.0* 33.7*  PLT 266 290 293   BMET  Recent Labs  01/26/13 0435 01/27/13 0455 01/28/13 0500  NA 135  --  135  K 3.3* 3.0* 3.1*  CL 95*  --  97  CO2 30  --  31  GLUCOSE 106*  --  101*  BUN 9  --  9  CREATININE 0.67  --  0.66  CALCIUM 8.4  --  8.6   LFT  Recent Labs  01/27/13 0455 01/28/13 0500  PROT 5.0* 5.2*  ALBUMIN 1.8* 1.9*  AST 148* 149*  ALT 76* 81*  ALKPHOS 135* 137*  BILITOT 28.9* 28.6*  BILIDIR 21.9*  --   IBILI 7.0*  --    PT/INR  Recent Labs  01/26/13 0435 01/28/13 0500  LABPROT 15.6* 14.9  INR 1.27 1.20   No results found.  Medications: I have  reviewed the patient's current medications.  Assessment/Plan: Acute alcoholic hepatitis: LFT's not much different over the last few days. He is now on Lactulose. I will add Xifaxan to his treatment regimen and see if this help him. Continue present care.    LOS: 15 days   Eric Mueller 01/28/2013, 6:24 PM

## 2013-01-28 NOTE — Progress Notes (Signed)
Patient ID: Eric Mueller, male   DOB: 1985/10/08, 27 y.o.   MRN: [id number]  [facility] PROGRESS NOTE  Eric Mueller [id number]:[id number] DOB: November 25, 1985 DOA: 01/13/2013 PCP: Lucilla Edin, MD  Brief Narrative: Eric Mueller is a 27 y.o. male has a past medical history significant for alcohol abuse, with a prior hospitalization in January of 2014 for acute pancreatitis, another hospitalization in July for alcohol withdrawal seizures, presented to the emergency room with a chief complaint of yellowing of his eyes, fatigue, easy bruising, progressively worsening over the past 2 weeks. Patient was admitted with alcoholic hepatitis and subsequently admitted to ICU for progressive acute encephalopathy/DTs. Mental status slowly improved and transferred to floor 01/20/2013.   Assessment/Plan:  Acute on chronic liver injury/alcoholic hepatitis  - pt not responding to steroids, continue same regimen for now and nutritional support, appreciate GI input - Continues to be fatigued but able to ambulate in hallway this AM - confusion is now improved and pt feels more at his baseline  Severe malnutrition  - appreciate nutrition help  Leukocytosis  - afebrile, no evidence of infection, may be due to steroids  - UA and CXR unremarkable  Seizure - continue keppra. 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 poor nutritional status/alcohol, supplement today and repeat electrolyte panel in AM Coagulopathy  - INR normalized to 1.36 on 9/21. No bleeding.  ETOH abuse/Severe DTs  - resolved  DVT Prophylaxis  - SCDs. Lovenox.   Code Status: Full  Family Communication: Pt at bedside, no other fa Disposition Plan: inpatient   Consultants:  GI SIGNIFICANT EVENTS / STUDIES:  9/16 - [place] abd/pelvis>>> Portal and hepatic veins are patent with normal flow. Hepatomegally, GB sludge.   9/19 - [place] abd>>>no ascites  9/19 - Echo>>>EF 45-55% diffuse hypokinesis  CULTURES:  MRSA PCR 9/16>>neg  Pleural Fluid Culture 9/20>>>  ANTIBIOTICS:  Unasyn 9/20>>>9/22  HPI/Subjective: No events overnight.   Objective: Filed Vitals:   01/27/13 2135 01/28/13 0425 01/28/13 0700 01/28/13 1420  BP: 116/71 107/66 95/60 116/70  Pulse: 54 58 48 55  Temp: 98.8 F (37.1 C) 98.1 F (36.7 C) 99 F (37.2 C) 98.4 F (36.9 C)  TempSrc: Oral Oral Oral Oral  Resp: 18 18  18   Height:      Weight:      SpO2: 100% 99% 98% 100%    Intake/Output Summary (Last 24 hours) at 01/28/13 1438 Last data filed at 01/28/13 1421  Gross per 24 hour  Intake   1080 ml  Output    820 ml  Net    260 ml    Exam:   General:  Pt is alert, follows commands appropriately, not in acute distress, jaundiced sclear   Cardiovascular: Regular rate and rhythm, S1/S2, no murmurs, no rubs, no gallops  Respiratory: Clear to auscultation bilaterally, no wheezing, no crackles, no rhonchi  Abdomen: Soft, non tender, slightly distended, bowel sounds present, no guarding  Extremities: pulses DP and PT palpable bilaterally  Neuro: Grossly nonfocal  Data Reviewed: Basic Metabolic Panel:  Recent Labs Lab 01/22/13 0419 01/23/13 0430 01/24/13 0603 01/25/13 0555 01/26/13 0435 01/27/13 0455 01/28/13 0500  NA 138 139 137  --  135  --  135  K 2.6* 3.5 2.9* 3.5 3.3* 3.0* 3.1*  CL 100 100 97  --  95*  --  97  CO2 29 31 29   --  30  --  31  GLUCOSE 103* 100* 106*  --  106*  --  101*  BUN 8 7 8   --  9  --  9  CREATININE 0.56 0.71 0.60  --  0.67  --  0.66  CALCIUM 8.4 8.6 8.5  --  8.4  --  8.6  MG 1.8  --  1.9  --  1.9  --  2.1  PHOS  --   --  3.4  --  3.5  --  3.1   Liver Function Tests:  Recent Labs Lab 01/24/13 0603 01/25/13 0555 01/26/13 0435 01/27/13 0455 01/28/13 0500  AST 195* 195* 172* 148* 149*  ALT 79* 83* 79* 76* 81*  ALKPHOS 144* 136* 134* 135* 137*  BILITOT 27.5* 29.1* 28.3* 28.9* 28.6*   PROT 4.8* 4.9* 4.9* 5.0* 5.2*  ALBUMIN 1.9* 1.9* 1.8* 1.8* 1.9*   CBC:  Recent Labs Lab 01/22/13 0419 01/23/13 0430 01/26/13 0435 01/27/13 0455 01/28/13 0500  WBC 8.8 9.9 18.0* 16.8* 17.9*  NEUTROABS  --   --   --  13.5*  --   HGB 11.1* 11.3* 11.2* 11.2* 11.3*  HCT 33.0* 33.9* 33.4* 33.0* 33.7*  MCV 107.8* 108.0* 107.1* 106.5* 107.0*  PLT 195 222 266 290 293   Scheduled Meds: . antiseptic oral rinse  15 mL Mouth Rinse BID  . diphenhydrAMINE  25 mg Intravenous Once  . enoxaparin (LOVENOX) injection  40 mg Subcutaneous Q24H  . feeding supplement  1 Container Oral BID BM  . folic acid  1 mg Intravenous Daily   Or  . folic acid  1 mg Oral Daily  . ketorolac  30 mg Intravenous Once  . lactulose  10 g Oral BID  . levETIRAcetam  1,000 mg Oral BID  . methylPREDNISolone (SOLU-MEDROL) injection  40 mg Intravenous Q24H  . metoCLOPramide (REGLAN) injection  10 mg Intravenous Once  . multivitamin with minerals  1 tablet Oral Daily  . pantoprazole  40 mg Oral BID  . sodium chloride  3 mL Intravenous Q12H  . thiamine  100 mg Oral Daily   Or  . thiamine  100 mg Intravenous Daily   Continuous Infusions:   Debbora Presto, MD  [facility] Pager [phone]  If 7PM-7AM, please contact night-coverage [email or web address] Password [email or web address] 01/28/2013, 2:38 PM   LOS: 15 days

## 2013-01-29 LAB — CBC
HCT: 33.7 % — ABNORMAL LOW (ref 39.0–52.0)
Hemoglobin: 11.5 g/dL — ABNORMAL LOW (ref 13.0–17.0)
MCH: 36.4 pg — ABNORMAL HIGH (ref 26.0–34.0)
Platelets: 299 10*3/uL (ref 150–400)
RBC: 3.16 MIL/uL — ABNORMAL LOW (ref 4.22–5.81)
WBC: 18 10*3/uL — ABNORMAL HIGH (ref 4.0–10.5)

## 2013-01-29 LAB — COMPREHENSIVE METABOLIC PANEL
ALT: 87 U/L — ABNORMAL HIGH (ref 0–53)
AST: 151 U/L — ABNORMAL HIGH (ref 0–37)
CO2: 31 mEq/L (ref 19–32)
Chloride: 96 mEq/L (ref 96–112)
Creatinine, Ser: 0.7 mg/dL (ref 0.50–1.35)
GFR calc non Af Amer: >90 mL/min (ref >90)
Glucose, Bld: 108 mg/dL — ABNORMAL HIGH (ref 70–99)
Potassium: 3 mEq/L — ABNORMAL LOW (ref 3.5–5.1)
Sodium: 136 mEq/L (ref 135–145)
Total Bilirubin: 27.5 mg/dL (ref 0.3–1.2)

## 2013-01-29 MED ORDER — LORAZEPAM 2 MG/ML IJ SOLN
1.0000 mg | Freq: Once | INTRAMUSCULAR | Status: AC
  Administered 2013-01-29: 07:00:00 1 mg via INTRAVENOUS
  Filled 2013-01-29: qty 1

## 2013-01-29 MED ORDER — POTASSIUM CHLORIDE CRYS ER 20 MEQ PO TBCR
40.0000 meq | EXTENDED_RELEASE_TABLET | Freq: Two times a day (BID) | ORAL | Status: AC
  Administered 2013-01-29 (×2): 40 meq via ORAL
  Filled 2013-01-29 (×2): qty 2

## 2013-01-29 MED ORDER — DIPHENHYDRAMINE HCL 25 MG PO CAPS
25.0000 mg | ORAL_CAPSULE | Freq: Three times a day (TID) | ORAL | Status: DC | PRN
  Administered 2013-01-29 – 2013-02-01 (×6): 25 mg via ORAL
  Filled 2013-01-29 (×6): qty 1

## 2013-01-29 NOTE — Progress Notes (Signed)
Patient ID: Eric Mueller, male   DOB: 16-Mar-1986, 27 y.o.   MRN: [id number]  [facility] PROGRESS NOTE  ROYDEN BULMAN WUJ:[id number] DOB: 01/20/1986 DOA: 01/13/2013 PCP: Lucilla Edin, MD  Brief Narrative:  Eric Mueller is a 26 y.o. male has a past medical history significant for alcohol abuse, with a prior hospitalization in January of 2014 for acute pancreatitis, another hospitalization in July for alcohol withdrawal seizures, presented to the emergency room with a chief complaint of yellowing of his eyes, fatigue, easy bruising, progressively worsening over the past 2 weeks. Patient was admitted with alcoholic hepatitis and subsequently admitted to ICU for progressive acute encephalopathy/DTs. Mental status slowly improved and transferred to floor 01/20/2013.   Assessment/Plan:  Acute on chronic liver injury/alcoholic hepatitis  - pt not responding to steroids, continue same regimen for now and nutritional support, appreciate GI input  - Continues to be fatigued but able to ambulate in hallway, I have encourage continuing PT while inpatient   - confusion is now improved and pt feels more at his baseline  Severe malnutrition  - appreciate nutrition help  Leukocytosis  - afebrile, no evidence of infection, may be due to steroids  - UA and CXR unremarkable  Seizure  - continue keppra. 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 poor nutritional status/alcohol, continue to supplement and repeat BMP in AM Coagulopathy  - INR normalized to 1.36 on 9/21. No bleeding.  ETOH abuse/Severe DTs  - resolved  DVT Prophylaxis  - SCDs. Lovenox  Code Status: Full  Family Communication: Pt at bedside, no other fa  Disposition Plan: discussed disposition plan, awaiting GI clearance for d/c as pt wants to know if the GI is fine with d/c    Consultants:   GI SIGNIFICANT EVENTS / STUDIES:  9/16 - [place] abd/pelvis>>> Portal and hepatic veins are patent with normal flow. Hepatomegally, GB sludge.  9/19 - [place] abd>>>no ascites  9/19 - Echo>>>EF 45-55% diffuse hypokinesis  CULTURES:  MRSA PCR 9/16>>neg  Pleural Fluid Culture 9/20>>>  ANTIBIOTICS:  Unasyn 9/20>>>9/22  HPI/Subjective: No events overnight.   Objective: Filed Vitals:   01/28/13 0700 01/28/13 1420 01/28/13 2105 01/29/13 0651  BP: 95/60 116/70 111/73 112/75  Pulse: 48 55 57 63  Temp: 99 F (37.2 C) 98.4 F (36.9 C) 98.5 F (36.9 C) 97.9 F (36.6 C)  TempSrc: Oral Oral Oral Oral  Resp:  18 18 18   Height:      Weight:      SpO2: 98% 100% 100% 100%    Intake/Output Summary (Last 24 hours) at 01/29/13 0912 Last data filed at 01/29/13 0700  Gross per 24 hour  Intake    840 ml  Output      0 ml  Net    840 ml    Exam:   General:  Pt is alert, follows commands appropriately, not in acute distress, jaundiced sclera  Cardiovascular: Regular rate and rhythm, S1/S2, no murmurs, no rubs, no gallops  Respiratory: Clear to auscultation bilaterally, no wheezing, no crackles, no rhonchi  Abdomen: Slightly tender in epigastric area, mildly distended, bowel sounds present, no guarding  Extremities: Pulses DP and PT palpable bilaterally  Neuro: Grossly nonfocal  Data Reviewed: Basic Metabolic Panel:  Recent Labs Lab 01/23/13 0430 01/24/13 0603 01/25/13 0555 01/26/13 0435 01/27/13 0455 01/28/13 0500 01/29/13 0505  NA 139 137  --  135  --  135 136  K 3.5 2.9* 3.5 3.3* 3.0* 3.1* 3.0*  CL 100 97  --  95*  --  97 96  CO2 31 29  --  30  --  31 31  GLUCOSE 100* 106*  --  106*  --  101* 108*  BUN 7 8  --  9  --  9 9  CREATININE 0.71 0.60  --  0.67  --  0.66 0.70  CALCIUM 8.6 8.5  --  8.4  --  8.6 8.6  MG  --  1.9  --  1.9  --  2.1  --   PHOS  --  3.4  --  3.5  --  3.1  --    Liver Function Tests:  Recent Labs Lab 01/25/13 0555 01/26/13 0435 01/27/13 0455  01/28/13 0500 01/29/13 0505  AST 195* 172* 148* 149* 151*  ALT 83* 79* 76* 81* 87*  ALKPHOS 136* 134* 135* 137* 141*  BILITOT 29.1* 28.3* 28.9* 28.6* 27.5*  PROT 4.9* 4.9* 5.0* 5.2* 5.3*  ALBUMIN 1.9* 1.8* 1.8* 1.9* 1.8*   CBC:  Recent Labs Lab 01/23/13 0430 01/26/13 0435 01/27/13 0455 01/28/13 0500 01/29/13 0505  WBC 9.9 18.0* 16.8* 17.9* 18.0*  NEUTROABS  --   --  13.5*  --   --   HGB 11.3* 11.2* 11.2* 11.3* 11.5*  HCT 33.9* 33.4* 33.0* 33.7* 33.7*  MCV 108.0* 107.1* 106.5* 107.0* 106.6*  PLT 222 266 290 293 299   Scheduled Meds: . antiseptic oral rinse  15 mL Mouth Rinse BID  . diphenhydrAMINE  25 mg Intravenous Once  . enoxaparin (LOVENOX) injection  40 mg Subcutaneous Q24H  . feeding supplement  1 Container Oral BID BM  . folic acid  1 mg Intravenous Daily   Or  . folic acid  1 mg Oral Daily  . ketorolac  30 mg Intravenous Once  . lactulose  10 g Oral QID  . levETIRAcetam  1,000 mg Oral BID  . methylPREDNISolone (SOLU-MEDROL) injection  40 mg Intravenous Q24H  . metoCLOPramide (REGLAN) injection  10 mg Intravenous Once  . multivitamin with minerals  1 tablet Oral Daily  . pantoprazole  40 mg Oral BID  . potassium chloride  40 mEq Oral Once  . rifaximin  550 mg Oral TID  . sodium chloride  3 mL Intravenous Q12H  . thiamine  100 mg Oral Daily   Or  . thiamine  100 mg Intravenous Daily   Continuous Infusions:    Debbora Presto, MD  [facility] Pager [phone]  If 7PM-7AM, please contact night-coverage [email or web address] Password [email or web address] 01/29/2013, 9:12 AM   LOS: 16 days

## 2013-01-29 NOTE — Progress Notes (Signed)
I had a very long discussion with the patient, his mother, and ?grandmother.  I answered their questions about the patient and his prognosis.  I was frank with them and told them that he has a poor prognosis.  He has a 75% risk of mortality within the next 6 months.  I also answer questions about Wilson's Disease and Hemochromatosis.  These are valid questions, but in the context of ETOH I do not feel that it is worthwhile to evaluate these etiologies.  If the patient recovers and he has persistent hepatic issues in the absence of ETOH, I will pursue further work up at that time.    The question is the determination for discharge.  He remains stable at this time.  There is no significant change with his bilirubin and I do not know if it will drop down.  At some point he will need to be discharged.  I will discuss the situation with Dr. Izola Price.  I can follow up with him in the office, but I was clear that any missed appointments without a legitimate reason will constitute an automatic discharge from the practice.  It would be best for him to enroll in an inpatient rehab facility as soon as possible after discharge.    Plan: 1) Continue with prednisolone. 2) Continue with nutrition support.

## 2013-01-30 LAB — COMPREHENSIVE METABOLIC PANEL
ALT: 94 U/L — ABNORMAL HIGH (ref 0–53)
AST: 157 U/L — ABNORMAL HIGH (ref 0–37)
Calcium: 8.7 mg/dL (ref 8.4–10.5)
Chloride: 95 mEq/L — ABNORMAL LOW (ref 96–112)
Creatinine, Ser: 0.67 mg/dL (ref 0.50–1.35)
GFR calc Af Amer: >90 mL/min (ref >90)
Glucose, Bld: 106 mg/dL — ABNORMAL HIGH (ref 70–99)
Sodium: 135 mEq/L (ref 135–145)
Total Bilirubin: 27.7 mg/dL (ref 0.3–1.2)
Total Protein: 5.4 g/dL — ABNORMAL LOW (ref 6.0–8.3)

## 2013-01-30 LAB — CBC
MCH: 36 pg — ABNORMAL HIGH (ref 26.0–34.0)
MCHC: 34.3 g/dL (ref 30.0–36.0)
MCV: 104.7 fL — ABNORMAL HIGH (ref 78.0–100.0)
Platelets: 322 10*3/uL (ref 150–400)

## 2013-01-30 MED ORDER — TRAMADOL HCL 50 MG PO TABS: 50.0000 mg | ORAL_TABLET | Freq: Two times a day (BID) | ORAL | Status: DC | PRN

## 2013-01-30 MED ORDER — THIAMINE HCL 100 MG PO TABS: 100.0000 mg | ORAL_TABLET | Freq: Every day | ORAL | Status: DC

## 2013-01-30 MED ORDER — LACTULOSE 10 GM/15ML PO SOLN: 10.0000 g | Freq: Four times a day (QID) | ORAL | Status: DC

## 2013-01-30 MED ORDER — PANTOPRAZOLE SODIUM 40 MG PO TBEC: 40.0000 mg | DELAYED_RELEASE_TABLET | Freq: Two times a day (BID) | ORAL | Status: DC

## 2013-01-30 MED ORDER — FOLIC ACID 1 MG PO TABS: 1.0000 mg | ORAL_TABLET | Freq: Every day | ORAL | Status: DC

## 2013-01-30 MED ORDER — RIFAXIMIN 550 MG PO TABS: 550.0000 mg | ORAL_TABLET | Freq: Three times a day (TID) | ORAL | Status: DC

## 2013-01-30 MED ORDER — DIPHENHYDRAMINE HCL 25 MG PO CAPS: 25.0000 mg | ORAL_CAPSULE | Freq: Three times a day (TID) | ORAL | Status: DC | PRN

## 2013-01-30 MED ORDER — ONDANSETRON HCL 4 MG PO TABS: 4.0000 mg | ORAL_TABLET | Freq: Four times a day (QID) | ORAL | Status: DC | PRN

## 2013-01-30 MED ORDER — POTASSIUM CHLORIDE CRYS ER 20 MEQ PO TBCR
40.0000 meq | EXTENDED_RELEASE_TABLET | Freq: Once | ORAL | Status: AC
  Administered 2013-01-30: 40 meq via ORAL
  Filled 2013-01-30: qty 2

## 2013-01-30 MED ORDER — LORAZEPAM 0.5 MG PO TABS: 0.5000 mg | ORAL_TABLET | Freq: Three times a day (TID) | ORAL | Status: DC | PRN

## 2013-01-30 MED ORDER — HYDROMORPHONE HCL 2 MG PO TABS
1.0000 mg | ORAL_TABLET | Freq: Two times a day (BID) | ORAL | Status: DC | PRN
  Administered 2013-01-30 – 2013-02-02 (×5): 1 mg via ORAL
  Filled 2013-01-30 (×6): qty 1

## 2013-01-30 MED ORDER — DIPHENHYDRAMINE-ZINC ACETATE 2-0.1 % EX CREA: TOPICAL_CREAM | Freq: Two times a day (BID) | CUTANEOUS | Status: DC | PRN

## 2013-01-30 NOTE — Progress Notes (Signed)
Clinical Social Work  CSW met with patient at bedside. Patient sitting in bed when CSW arrived and agreeable to session. Patient reports that he feels he is "being pushed out" in regards to DC plans. Patient is aware that he has been hospitalized for a couple of weeks but reports he has not made definite plans at DC.   Patient's parents are divorced and patient currently lives with mom. Mom works during the day so patient would be at home alone. Patient reports that he has a good rapport with dad and has been discussing plans with him. Patient spoke with CSW about when he met with the MD yesterday who told him he has a 75% chance of dying within the next 6 months. Patient reports this was a shock but does not elaborate on feelings beyond that statement. CSW inquired if patient would remain compliant with treatment and if he felt he could return home without drinking alcohol. Patient denies any current cravings and reports he feels he does not want to drink anymore.   CSW asked about substance abuse treatment options again. Patient reports that he needs to discuss plans with family. CSW left contact information and encouraged patient to call CSW if he wanted assistance with treatment. CSW offered to meet with patient and family but patient is unsure when family will arrive.  CSW encouraged patient to consider therapy to deal with emotions about possible end of life. CSW suggested individual therapy or support groups. Patient reports he will consider options. CSW will continue to follow.  [place], [place] [phone]

## 2013-01-30 NOTE — Progress Notes (Signed)
Clinical Social Work  CSW met with patient and father at bedside. Patient agreeable for CSW to discuss plans in front of father. Father reports he does not feel that patient can return home because he is worried that he will relapse. CSW explained that CSW had met and spoken with patient several times regarding treatment but patient was not decisive on plans. Father reports that he has spoken to patient and that he will have to go to rehab.   CSW provided substance abuse resources and father is interested in [facility]. Patient agreeable to sign ROI and CSW called [facility] and spoke with Chrissie Noa. [facility] reports they would have to get authorization from insurance company and would have to review information prior to accepting patient. CSW faxed referral to [facility] and provided father with contact information.  CSW explained if [facility] is unable to accept then they could choose another facility listed on resource list. CSW also encouraged patient to call insurance company to determine other facilties that accept his insurance. Father agreeable to plan and to assist patient as needed.  CSW updated MD on treatment plans. CSW explained that [facility] cannot get authorization today and has not formally accepted patient but that patient does not have to be hospitalized to be accepted.  CSW will continue to follow.  [place], [place] [phone]

## 2013-01-30 NOTE — Progress Notes (Signed)
Subjective: No acute events.  Feels well.  Objective: Vital signs in last 24 hours: Temp:  [98 F (36.7 C)-98.4 F (36.9 C)] 98.4 F (36.9 C) (10/03 1353) Pulse Rate:  [52-66] 66 (10/03 1353) Resp:  [16-20] 16 (10/03 1353) BP: (110-118)/(68-76) 112/72 mmHg (10/03 1353) SpO2:  [100 %] 100 % (10/03 1353) Last BM Date: 01/28/13  Intake/Output from previous day:   Intake/Output this shift: Total I/O In: 120 [P.O.:120] Out: -   General appearance: alert and no distress GI: soft, hepatomegaly  Lab Results:  Recent Labs  01/28/13 0500 01/29/13 0505 01/30/13 0440  WBC 17.9* 18.0* 17.5*  HGB 11.3* 11.5* 11.4*  HCT 33.7* 33.7* 33.2*  PLT 293 299 322   BMET  Recent Labs  01/28/13 0500 01/29/13 0505 01/30/13 0440  NA 135 136 135  K 3.1* 3.0* 3.3*  CL 97 96 95*  CO2 31 31 30   GLUCOSE 101* 108* 106*  BUN 9 9 10   CREATININE 0.66 0.70 0.67  CALCIUM 8.6 8.6 8.7   LFT  Recent Labs  01/30/13 0440  PROT 5.4*  ALBUMIN 1.9*  AST 157*  ALT 94*  ALKPHOS 136*  BILITOT 27.7*   PT/INR  Recent Labs  01/28/13 0500  LABPROT 14.9  INR 1.20   Hepatitis Panel No results found for this basename: HEPBSAG, HCVAB, HEPAIGM, HEPBIGM,  in the last 72 hours C-Diff No results found for this basename: CDIFFTOX,  in the last 72 hours Fecal Lactopherrin No results found for this basename: FECLLACTOFRN,  in the last 72 hours  Studies/Results: No results found.  Medications:  Scheduled: . antiseptic oral rinse  15 mL Mouth Rinse BID  . diphenhydrAMINE  25 mg Intravenous Once  . enoxaparin (LOVENOX) injection  40 mg Subcutaneous Q24H  . feeding supplement  1 Container Oral BID BM  . folic acid  1 mg Intravenous Daily   Or  . folic acid  1 mg Oral Daily  . ketorolac  30 mg Intravenous Once  . lactulose  10 g Oral QID  . levETIRAcetam  1,000 mg Oral BID  . methylPREDNISolone (SOLU-MEDROL) injection  40 mg Intravenous Q24H  . metoCLOPramide (REGLAN) injection  10 mg  Intravenous Once  . multivitamin with minerals  1 tablet Oral Daily  . pantoprazole  40 mg Oral BID  . potassium chloride  40 mEq Oral Once  . potassium chloride  40 mEq Oral Once  . rifaximin  550 mg Oral TID  . sodium chloride  3 mL Intravenous Q12H  . thiamine  100 mg Oral Daily   Or  . thiamine  100 mg Intravenous Daily   Continuous:   Assessment/Plan: 1) Severe ETOH hepatitis. 2) ETOH abuse.   No significant changes.  Blood work still demonstrates a high bilirubin.  Plan: 1) Okay to D/C home. 2) He needs to complete a 28 day course of prednisolone 40 mg and then taper. 3) Follow up in the office in 1 week.   LOS: 17 days   Neema Barreira D 01/30/2013, 4:22 PM

## 2013-01-30 NOTE — Progress Notes (Signed)
Patient ID: Eric Mueller, male   DOB: 1986/04/06, 27 y.o.   MRN: [id number] [facility] PROGRESS NOTE  TIGE MEAS ONG:[id number] DOB: Jul 27, 1985 DOA: 01/13/2013 PCP: Eric Edin, MD  Brief Narrative:  Eric Mueller is a 27 y.o. male has a past medical history significant for alcohol abuse, with a prior hospitalization in January of 2014 for acute pancreatitis, another hospitalization in July for alcohol withdrawal seizures, presented to the emergency room with a chief complaint of yellowing of his eyes, fatigue, easy bruising, progressively worsening over the past 2 weeks. Patient was admitted with alcoholic hepatitis and subsequently admitted to ICU for progressive acute encephalopathy/DTs. Mental status slowly improved and transferred to floor 01/20/2013.   Assessment/Plan:  Acute on chronic liver injury/alcoholic hepatitis  - pt not responding to steroids, continue same regimen for now and nutritional support, appreciate GI input  - I have encouraged continuing PT while inpatient  - confusion is now improved and pt feels more at his baseline  Severe malnutrition  - appreciate nutrition help  Leukocytosis  - afebrile, no evidence of infection, may be due to steroids  - UA and CXR unremarkable  Seizure  - continue keppra. 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 poor nutritional status/alcohol, continue to supplement and repeat BMP in AM  Coagulopathy  - INR normalized to 1.36 on 9/21. No bleeding.  ETOH abuse/Severe DTs  - resolved  DVT Prophylaxis  - SCDs. Lovenox   Code Status: Full  Family Communication: Pt at bedside, no other fa  Disposition Plan: discussed disposition plan, awaiting GI clearance for d/c as pt wants to know if the GI is fine with d/c   Consultants:  GI SIGNIFICANT EVENTS / STUDIES:  9/16 - [place] abd/pelvis>>>  Portal and hepatic veins are patent with normal flow. Hepatomegally, GB sludge.  9/19 - [place] abd>>>no ascites  9/19 - Echo>>>EF 45-55% diffuse hypokinesis  CULTURES:  MRSA PCR 9/16>>neg  Pleural Fluid Culture 9/20>>>  ANTIBIOTICS:  Unasyn 9/20>>>9/22   HPI/Subjective: No events overnight.   Objective: Filed Vitals:   01/29/13 1300 01/29/13 2216 01/30/13 0647 01/30/13 1353  BP: 113/72 118/76 110/68 112/72  Pulse: 60 62 52 66  Temp: 98 F (36.7 C) 98.3 F (36.8 C) 98 F (36.7 C) 98.4 F (36.9 C)  TempSrc: Oral Oral Oral Oral  Resp: 17 18 20 16   Height:      Weight:      SpO2: 100% 100% 100% 100%    Intake/Output Summary (Last 24 hours) at 01/30/13 1553 Last data filed at 01/30/13 1300  Gross per 24 hour  Intake    120 ml  Output      0 ml  Net    120 ml    Exam:   General:  Pt is alert, follows commands appropriately, not in acute distress  Cardiovascular: Regular rate and rhythm, S1/S2, no murmurs, no rubs, no gallops  Respiratory: Clear to auscultation bilaterally, no wheezing, no crackles, no rhonchi  Abdomen: Soft, non tender, non distended, bowel sounds present, no guarding  Extremities: No edema, pulses DP and PT palpable bilaterally  Neuro: Grossly nonfocal  Data Reviewed: Basic Metabolic Panel:  Recent Labs Lab 01/24/13 0603  01/26/13 0435 01/27/13 0455 01/28/13 0500 01/29/13 0505 01/30/13 0440  NA 137  --  135  --  135 136 135  K 2.9*  < >  3.3* 3.0* 3.1* 3.0* 3.3*  CL 97  --  95*  --  97 96 95*  CO2 29  --  30  --  31 31 30   GLUCOSE 106*  --  106*  --  101* 108* 106*  BUN 8  --  9  --  9 9 10   CREATININE 0.60  --  0.67  --  0.66 0.70 0.67  CALCIUM 8.5  --  8.4  --  8.6 8.6 8.7  MG 1.9  --  1.9  --  2.1  --   --   PHOS 3.4  --  3.5  --  3.1  --   --   < > = values in this interval not displayed. Liver Function Tests:  Recent Labs Lab 01/26/13 0435 01/27/13 0455 01/28/13 0500 01/29/13 0505 01/30/13 0440  AST 172* 148* 149* 151*  157*  ALT 79* 76* 81* 87* 94*  ALKPHOS 134* 135* 137* 141* 136*  BILITOT 28.3* 28.9* 28.6* 27.5* 27.7*  PROT 4.9* 5.0* 5.2* 5.3* 5.4*  ALBUMIN 1.8* 1.8* 1.9* 1.8* 1.9*   CBC:  Recent Labs Lab 01/26/13 0435 01/27/13 0455 01/28/13 0500 01/29/13 0505 01/30/13 0440  WBC 18.0* 16.8* 17.9* 18.0* 17.5*  NEUTROABS  --  13.5*  --   --   --   HGB 11.2* 11.2* 11.3* 11.5* 11.4*  HCT 33.4* 33.0* 33.7* 33.7* 33.2*  MCV 107.1* 106.5* 107.0* 106.6* 104.7*  PLT 266 290 293 299 322   Scheduled Meds: . antiseptic oral rinse  15 mL Mouth Rinse BID  . diphenhydrAMINE  25 mg Intravenous Once  . enoxaparin (LOVENOX) injection  40 mg Subcutaneous Q24H  . feeding supplement  1 Container Oral BID BM  . folic acid  1 mg Intravenous Daily   Or  . folic acid  1 mg Oral Daily  . ketorolac  30 mg Intravenous Once  . lactulose  10 g Oral QID  . levETIRAcetam  1,000 mg Oral BID  . methylPREDNISolone (SOLU-MEDROL) injection  40 mg Intravenous Q24H  . metoCLOPramide (REGLAN) injection  10 mg Intravenous Once  . multivitamin with minerals  1 tablet Oral Daily  . pantoprazole  40 mg Oral BID  . potassium chloride  40 mEq Oral Once  . rifaximin  550 mg Oral TID  . sodium chloride  3 mL Intravenous Q12H  . thiamine  100 mg Oral Daily   Or  . thiamine  100 mg Intravenous Daily   Continuous Infusions:  Debbora Presto, MD  [facility] Pager [phone]  If 7PM-7AM, please contact night-coverage [email or web address] Password [email or web address] 01/30/2013, 3:53 PM   LOS: 17 days

## 2013-01-31 LAB — COMPREHENSIVE METABOLIC PANEL
ALT: 98 U/L — ABNORMAL HIGH (ref 0–53)
AST: 156 U/L — ABNORMAL HIGH (ref 0–37)
Albumin: 1.9 g/dL — ABNORMAL LOW (ref 3.5–5.2)
Alkaline Phosphatase: 137 U/L — ABNORMAL HIGH (ref 39–117)
CO2: 30 mEq/L (ref 19–32)
Calcium: 8.6 mg/dL (ref 8.4–10.5)
Chloride: 96 mEq/L (ref 96–112)
Creatinine, Ser: 0.83 mg/dL (ref 0.50–1.35)
GFR calc non Af Amer: >90 mL/min (ref >90)
Potassium: 2.9 mEq/L — ABNORMAL LOW (ref 3.5–5.1)
Sodium: 135 mEq/L (ref 135–145)
Total Bilirubin: 23.8 mg/dL (ref 0.3–1.2)

## 2013-01-31 LAB — CBC
MCH: 36.4 pg — ABNORMAL HIGH (ref 26.0–34.0)
MCHC: 34.5 g/dL (ref 30.0–36.0)
MCV: 105.5 fL — ABNORMAL HIGH (ref 78.0–100.0)
Platelets: 322 10*3/uL (ref 150–400)
RBC: 3.3 MIL/uL — ABNORMAL LOW (ref 4.22–5.81)
RDW: 13.2 % (ref 11.5–15.5)

## 2013-01-31 MED ORDER — TRAMADOL HCL 50 MG PO TABS
50.0000 mg | ORAL_TABLET | Freq: Four times a day (QID) | ORAL | Status: DC | PRN
  Administered 2013-01-31 – 2013-02-02 (×4): 50 mg via ORAL
  Filled 2013-01-31 (×4): qty 1

## 2013-01-31 MED ORDER — LORAZEPAM 1 MG PO TABS
1.0000 mg | ORAL_TABLET | Freq: Four times a day (QID) | ORAL | Status: DC | PRN
  Administered 2013-01-31 – 2013-02-02 (×3): 1 mg via ORAL
  Filled 2013-01-31 (×3): qty 1

## 2013-01-31 MED ORDER — POTASSIUM CHLORIDE CRYS ER 20 MEQ PO TBCR
40.0000 meq | EXTENDED_RELEASE_TABLET | Freq: Two times a day (BID) | ORAL | Status: AC
  Administered 2013-01-31: 40 meq via ORAL
  Filled 2013-01-31 (×2): qty 2

## 2013-01-31 NOTE — Progress Notes (Signed)
Subjective: No acute events.  Feeling well.  Objective: Vital signs in last 24 hours: Temp:  [97.7 F (36.5 C)-98.4 F (36.9 C)] 97.7 F (36.5 C) (10/04 0441) Pulse Rate:  [50-66] 55 (10/04 0441) Resp:  [16] 16 (10/04 0441) BP: (111-125)/(70-77) 111/70 mmHg (10/04 0441) SpO2:  [100 %] 100 % (10/04 0441) Weight:  [136 lb 11 oz (62 kg)] 136 lb 11 oz (62 kg) (10/04 0453) Last BM Date: 01/28/13  Intake/Output from previous day: 10/03 0701 - 10/04 0700 In: 243 [P.O.:240; I.V.:3] Out: 325 [Urine:325] Intake/Output this shift:    General appearance: alert and no distress GI: soft, hepatomegaly  Lab Results:  Recent Labs  01/29/13 0505 01/30/13 0440 01/31/13 0518  WBC 18.0* 17.5* 16.8*  HGB 11.5* 11.4* 12.0*  HCT 33.7* 33.2* 34.8*  PLT 299 322 322   BMET  Recent Labs  01/29/13 0505 01/30/13 0440 01/31/13 0518  NA 136 135 135  K 3.0* 3.3* 2.9*  CL 96 95* 96  CO2 31 30 30   GLUCOSE 108* 106* 107*  BUN 9 10 9   CREATININE 0.70 0.67 0.83  CALCIUM 8.6 8.7 8.6   LFT  Recent Labs  01/31/13 0518  PROT 5.5*  ALBUMIN 1.9*  AST 156*  ALT 98*  ALKPHOS 137*  BILITOT 23.8*   PT/INR No results found for this basename: LABPROT, INR,  in the last 72 hours Hepatitis Panel No results found for this basename: HEPBSAG, HCVAB, HEPAIGM, HEPBIGM,  in the last 72 hours C-Diff No results found for this basename: CDIFFTOX,  in the last 72 hours Fecal Lactopherrin No results found for this basename: FECLLACTOFRN,  in the last 72 hours  Studies/Results: No results found.  Medications:  Scheduled: . antiseptic oral rinse  15 mL Mouth Rinse BID  . diphenhydrAMINE  25 mg Intravenous Once  . enoxaparin (LOVENOX) injection  40 mg Subcutaneous Q24H  . feeding supplement  1 Container Oral BID BM  . folic acid  1 mg Intravenous Daily   Or  . folic acid  1 mg Oral Daily  . ketorolac  30 mg Intravenous Once  . lactulose  10 g Oral QID  . levETIRAcetam  1,000 mg Oral BID  .  methylPREDNISolone (SOLU-MEDROL) injection  40 mg Intravenous Q24H  . metoCLOPramide (REGLAN) injection  10 mg Intravenous Once  . multivitamin with minerals  1 tablet Oral Daily  . pantoprazole  40 mg Oral BID  . potassium chloride  40 mEq Oral Once  . rifaximin  550 mg Oral TID  . sodium chloride  3 mL Intravenous Q12H  . thiamine  100 mg Oral Daily   Or  . thiamine  100 mg Intravenous Daily   Continuous:   Assessment/Plan: 1) Severe ETOH hepatitis. 2) ETOH abuse.    His bilirubin has dropped and this is very encouraging.  Arrangements are also being made for him to go directly to [facility] for in facility rehabilitation.  Plan: 1) Continue with prednisolone. 2) Anticipate discharge to [facility] on Monday. 3) Continue with PO intake.  LOS: 18 days   Fayth Trefry D 01/31/2013, 7:56 AM

## 2013-01-31 NOTE — Progress Notes (Signed)
Patient ID: Eric Mueller, male   DOB: 18-Jan-1986, 27 y.o.   MRN: [id number]  [facility] PROGRESS NOTE  [name] [id number]:[id number] DOB: June 14, 1985 DOA: 01/13/2013 PCP: Lucilla Edin, MD  Brief Narrative:  Eric Mueller is a 27 y.o. male has a past medical history significant for alcohol abuse, with a prior hospitalization in January of 2014 for acute pancreatitis, another hospitalization in July for alcohol withdrawal seizures, presented to the emergency room with a chief complaint of yellowing of his eyes, fatigue, easy bruising, progressively worsening over the past 2 weeks. Patient was admitted with alcoholic hepatitis and subsequently admitted to ICU for progressive acute encephalopathy/DTs. Mental status slowly improved and transferred to floor 01/20/2013.   Assessment/Plan:  Acute on chronic liver injury/alcoholic hepatitis  - pt not responding to steroids, continue same regimen for now and nutritional support, appreciate GI input  - I have encouraged continuing PT while inpatient  - confusion is now improved and at pt's baseline - bilirubin trending down  Severe malnutrition  - appreciate nutritionist recommendations - pt tolerating PO intake  Leukocytosis  - afebrile, no evidence of infection, may be due to steroids  - WBC trending down  - UA and CXR unremarkable  Seizure  - continue keppra. 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 poor nutritional status/alcohol, continue to supplement and repeat BMP in AM  Coagulopathy  - INR normalized to 1.36 on 9/21. No bleeding.  ETOH abuse/Severe DTs  - resolved  DVT Prophylaxis  - SCDs. Lovenox  Code Status: Full  Family Communication: Pt at bedside, no other fa  [facility]   Consultants:  GI SIGNIFICANT EVENTS / STUDIES:  9/16 - [place]  abd/pelvis>>> Portal and hepatic veins are patent with normal flow. Hepatomegally, GB sludge.  9/19 - [place] abd>>>no ascites  9/19 - Echo>>>EF 45-55% diffuse hypokinesis  CULTURES:  MRSA PCR 9/16>>neg  Pleural Fluid Culture 9/20>>>  ANTIBIOTICS:  Unasyn 9/20>>>9/22   HPI/Subjective: No events overnight.   Objective: Filed Vitals:   01/30/13 1353 01/30/13 2137 01/31/13 0441 01/31/13 0453  BP: 112/72 125/77 111/70   Pulse: 66 50 55   Temp: 98.4 F (36.9 C) 98.1 F (36.7 C) 97.7 F (36.5 C)   TempSrc: Oral Oral Oral   Resp: 16 16 16    Height:      Weight:    62 kg (136 lb 11 oz)  SpO2: 100% 100% 100%     Intake/Output Summary (Last 24 hours) at 01/31/13 0807 Last data filed at 01/31/13 0442  Gross per 24 hour  Intake    243 ml  Output    325 ml  Net    -82 ml    Exam:   General:  Pt is alert, follows commands appropriately, not in acute distress, jaundiced   Cardiovascular: Regular rate and rhythm, S1/S2, no murmurs, no rubs, no gallops  Respiratory: Clear to auscultation bilaterally, no wheezing, no crackles, no rhonchi  Abdomen: Soft, non tender, slightly distended, bowel sounds present, no guarding  Extremities: No edema, pulses DP and PT palpable bilaterally  Neuro: Grossly nonfocal  Data Reviewed: Basic Metabolic Panel:  Recent Labs Lab 01/26/13 0435 01/27/13 0455 01/28/13 0500 01/29/13 0505 01/30/13 0440 01/31/13 0518  NA 135  --  135 136 135 135  K 3.3* 3.0* 3.1* 3.0* 3.3* 2.9*  CL  95*  --  97 96 95* 96  CO2 30  --  31 31 30 30   GLUCOSE 106*  --  101* 108* 106* 107*  BUN 9  --  9 9 10 9   CREATININE 0.67  --  0.66 0.70 0.67 0.83  CALCIUM 8.4  --  8.6 8.6 8.7 8.6  MG 1.9  --  2.1  --   --   --   PHOS 3.5  --  3.1  --   --   --    Liver Function Tests:  Recent Labs Lab 01/27/13 0455 01/28/13 0500 01/29/13 0505 01/30/13 0440 01/31/13 0518  AST 148* 149* 151* 157* 156*  ALT 76* 81* 87* 94* 98*  ALKPHOS 135* 137* 141* 136* 137*  BILITOT  28.9* 28.6* 27.5* 27.7* 23.8*  PROT 5.0* 5.2* 5.3* 5.4* 5.5*  ALBUMIN 1.8* 1.9* 1.8* 1.9* 1.9*   CBC:  Recent Labs Lab 01/27/13 0455 01/28/13 0500 01/29/13 0505 01/30/13 0440 01/31/13 0518  WBC 16.8* 17.9* 18.0* 17.5* 16.8*  NEUTROABS 13.5*  --   --   --   --   HGB 11.2* 11.3* 11.5* 11.4* 12.0*  HCT 33.0* 33.7* 33.7* 33.2* 34.8*  MCV 106.5* 107.0* 106.6* 104.7* 105.5*  PLT 290 293 299 322 322   Scheduled Meds: . antiseptic oral rinse  15 mL Mouth Rinse BID  . diphenhydrAMINE  25 mg Intravenous Once  . enoxaparin (LOVENOX) injection  40 mg Subcutaneous Q24H  . feeding supplement  1 Container Oral BID BM  . folic acid  1 mg Intravenous Daily   Or  . folic acid  1 mg Oral Daily  . ketorolac  30 mg Intravenous Once  . lactulose  10 g Oral QID  . levETIRAcetam  1,000 mg Oral BID  . methylPREDNISolone (SOLU-MEDROL) injection  40 mg Intravenous Q24H  . metoCLOPramide (REGLAN) injection  10 mg Intravenous Once  . multivitamin with minerals  1 tablet Oral Daily  . pantoprazole  40 mg Oral BID  . potassium chloride  40 mEq Oral Once  . rifaximin  550 mg Oral TID  . sodium chloride  3 mL Intravenous Q12H  . thiamine  100 mg Oral Daily   Or  . thiamine  100 mg Intravenous Daily   Continuous Infusions:  Debbora Presto, MD  [facility] Pager [phone]  If 7PM-7AM, please contact night-coverage [email or web address] Password [email or web address] 01/31/2013, 8:07 AM   LOS: 18 days

## 2013-02-01 LAB — CBC
HCT: 30.8 % — ABNORMAL LOW (ref 39.0–52.0)
MCHC: 34.1 g/dL (ref 30.0–36.0)
MCV: 106.6 fL — ABNORMAL HIGH (ref 78.0–100.0)
Platelets: 257 10*3/uL (ref 150–400)
RDW: 13.2 % (ref 11.5–15.5)
WBC: 15.3 10*3/uL — ABNORMAL HIGH (ref 4.0–10.5)

## 2013-02-01 LAB — COMPREHENSIVE METABOLIC PANEL
ALT: 104 U/L — ABNORMAL HIGH (ref 0–53)
Alkaline Phosphatase: 131 U/L — ABNORMAL HIGH (ref 39–117)
BUN: 9 mg/dL (ref 6–23)
CO2: 27 mEq/L (ref 19–32)
Chloride: 97 mEq/L (ref 96–112)
Creatinine, Ser: 0.66 mg/dL (ref 0.50–1.35)
GFR calc Af Amer: >90 mL/min (ref >90)
GFR calc non Af Amer: >90 mL/min (ref >90)
Glucose, Bld: 98 mg/dL (ref 70–99)
Potassium: 3.5 mEq/L (ref 3.5–5.1)
Sodium: 134 mEq/L — ABNORMAL LOW (ref 135–145)
Total Bilirubin: 20.2 mg/dL (ref 0.3–1.2)

## 2013-02-01 NOTE — Progress Notes (Signed)
Lengthy conversation had with Pt and family.   Family concerned that Pt may not be medically stable enough for [facility] tomorrow.  Family wanting SNF as a back up.  Pt gave CSW permission to begin the SNF search.  CSW notified RN and MD.  CSW asked MD to order a PT eval, per family request.  Weekday CSW to follow.  Providence Crosby, LCSWA Clinical Social Work [phone]

## 2013-02-01 NOTE — Progress Notes (Signed)
Subjective: No complaints.  Feeling well.  Objective: Vital signs in last 24 hours: Temp:  [98 F (36.7 C)-98.2 F (36.8 C)] 98.2 F (36.8 C) (10/05 0449) Pulse Rate:  [53-62] 53 (10/05 0449) Resp:  [14-16] 14 (10/05 0449) BP: (97-124)/(62-90) 97/62 mmHg (10/05 0449) SpO2:  [99 %-100 %] 99 % (10/05 0449) Weight:  [137 lb 5.6 oz (62.3 kg)] 137 lb 5.6 oz (62.3 kg) (10/05 0451) Last BM Date: 01/31/13  Intake/Output from previous day: 10/04 0701 - 10/05 0700 In: 840 [P.O.:840] Out: 475 [Urine:475] Intake/Output this shift:    General appearance: alert and no distress GI: soft, non-tender; bowel sounds normal; no masses,  no organomegaly  Lab Results:  Recent Labs  01/30/13 0440 01/31/13 0518 02/01/13 0518  WBC 17.5* 16.8* 15.3*  HGB 11.4* 12.0* 10.5*  HCT 33.2* 34.8* 30.8*  PLT 322 322 257   BMET  Recent Labs  01/30/13 0440 01/31/13 0518 02/01/13 0518  NA 135 135 134*  K 3.3* 2.9* 3.5  CL 95* 96 97  CO2 30 30 27   GLUCOSE 106* 107* 98  BUN 10 9 9   CREATININE 0.67 0.83 0.66  CALCIUM 8.7 8.6 8.3*   LFT  Recent Labs  02/01/13 0518  PROT 5.2*  ALBUMIN 1.8*  AST 159*  ALT 104*  ALKPHOS 131*  BILITOT 20.2*   PT/INR No results found for this basename: LABPROT, INR,  in the last 72 hours Hepatitis Panel No results found for this basename: HEPBSAG, HCVAB, HEPAIGM, HEPBIGM,  in the last 72 hours C-Diff No results found for this basename: CDIFFTOX,  in the last 72 hours Fecal Lactopherrin No results found for this basename: FECLLACTOFRN,  in the last 72 hours  Studies/Results: No results found.  Medications:  Scheduled: . antiseptic oral rinse  15 mL Mouth Rinse BID  . diphenhydrAMINE  25 mg Intravenous Once  . enoxaparin (LOVENOX) injection  40 mg Subcutaneous Q24H  . feeding supplement  1 Container Oral BID BM  . folic acid  1 mg Oral Daily  . ketorolac  30 mg Intravenous Once  . lactulose  10 g Oral QID  . levETIRAcetam  1,000 mg Oral BID  .  methylPREDNISolone (SOLU-MEDROL) injection  40 mg Intravenous Q24H  . metoCLOPramide (REGLAN) injection  10 mg Intravenous Once  . multivitamin with minerals  1 tablet Oral Daily  . pantoprazole  40 mg Oral BID  . rifaximin  550 mg Oral TID  . sodium chloride  3 mL Intravenous Q12H  . thiamine  100 mg Oral Daily   Continuous:   Assessment/Plan: 1) Severe Alcoholic Hepatitis. 2) ETOH abuse.   His bilirubin continues to trend downwards.  Clinically he looks better.  He is able to ambulate, but he requires the assistance of a cane.  Plan: 1) Planning for D/C to [facility] tomorrow. 2) Follow up in the office in 1-2 weeks.   LOS: 19 days   Jamira Barfuss D 02/01/2013, 8:22 AM

## 2013-02-01 NOTE — Progress Notes (Signed)
Patient ID: Eric Mueller, male   DOB: 07/31/1985, 27 y.o.   MRN: [id number] [facility] PROGRESS NOTE  LINO WICKLIFF WUJ:[id number] DOB: 12-15-1985 DOA: 01/13/2013 PCP: Eric Edin, MD  Brief Narrative:  Eric ALTLAND is a 27 y.o. male has a past medical history significant for alcohol abuse, with a prior hospitalization in January of 2014 for acute pancreatitis, another hospitalization in July for alcohol withdrawal seizures, presented to the emergency room with a chief complaint of yellowing of his eyes, fatigue, easy bruising, progressively worsening over the past 2 weeks. Patient was admitted with alcoholic hepatitis and subsequently admitted to ICU for progressive acute encephalopathy/DTs. Mental status slowly improved and transferred to floor 01/20/2013.   Assessment/Plan:  Acute on chronic liver injury/alcoholic hepatitis  - pcontinue same regimen for now and nutritional support, appreciate GI input  - I have encouraged continuing PT while inpatient  - confusion is now improved and at pt's baseline  - bilirubin continues to trend down  Severe malnutrition  - appreciate nutritionist recommendations  - pt tolerating PO intake  Leukocytosis  - afebrile, no evidence of infection, may be due to steroids  - WBC trending down  - UA and CXR unremarkable  Seizure  - continue keppra. 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 poor nutritional status/alcohol, supplemented and K is within normal limits this AM Coagulopathy  - INR normalized to 1.36 on 9/21. No bleeding.  ETOH abuse/Severe DTs  - resolved  DVT Prophylaxis  - SCDs. Lovenox   Code Status: Full  Family Communication: Pt at bedside, no other fa  Disposition Plan: plan d/c Monday ? SNF  Consultants:  GI SIGNIFICANT EVENTS / STUDIES:  9/16 - [place] abd/pelvis>>> Portal and hepatic veins  are patent with normal flow. Hepatomegally, GB sludge.  9/19 - [place] abd>>>no ascites  9/19 - Echo>>>EF 45-55% diffuse hypokinesis  CULTURES:  MRSA PCR 9/16>>neg  Pleural Fluid Culture 9/20>>>  ANTIBIOTICS:  Unasyn 9/20>>>9/22   HPI/Subjective: No events overnight.   Objective: Filed Vitals:   01/31/13 1324 01/31/13 2029 02/01/13 0449 02/01/13 0451  BP: 108/72 124/90 97/62   Pulse: 62 53 53   Temp: 98 F (36.7 C) 98 F (36.7 C) 98.2 F (36.8 C)   TempSrc: Oral Oral Oral   Resp: 16 16 14    Height:      Weight:    62.3 kg (137 lb 5.6 oz)  SpO2: 99% 100% 99%     Intake/Output Summary (Last 24 hours) at 02/01/13 0738 Last data filed at 02/01/13 0449  Gross per 24 hour  Intake    840 ml  Output    475 ml  Net    365 ml    Exam:   General:  Pt is alert, follows commands appropriately, not in acute distress, jaundiced skin and sclera improving   Cardiovascular: Regular rate and rhythm, S1/S2, no murmurs, no rubs, no gallops  Respiratory: Clear to auscultation bilaterally, no wheezing, no crackles, no rhonchi  Abdomen: Soft, non tender, non distended, bowel sounds present, no guarding  Extremities: No edema, pulses DP and PT palpable bilaterally  Neuro: Grossly nonfocal  Data Reviewed: Basic Metabolic Panel:  Recent Labs Lab 01/26/13 0435  01/28/13 0500 01/29/13 0505 01/30/13 0440 01/31/13 0518 02/01/13 0518  NA 135  --  135 136 135 135 134*  K 3.3*  < > 3.1*  3.0* 3.3* 2.9* 3.5  CL 95*  --  97 96 95* 96 97  CO2 30  --  31 31 30 30 27   GLUCOSE 106*  --  101* 108* 106* 107* 98  BUN 9  --  9 9 10 9 9   CREATININE 0.67  --  0.66 0.70 0.67 0.83 0.66  CALCIUM 8.4  --  8.6 8.6 8.7 8.6 8.3*  MG 1.9  --  2.1  --   --   --   --   PHOS 3.5  --  3.1  --   --   --   --   < > = values in this interval not displayed. Liver Function Tests:  Recent Labs Lab 01/28/13 0500 01/29/13 0505 01/30/13 0440 01/31/13 0518 02/01/13 0518  AST 149* 151* 157* 156* 159*  ALT  81* 87* 94* 98* 104*  ALKPHOS 137* 141* 136* 137* 131*  BILITOT 28.6* 27.5* 27.7* 23.8* 20.2*  PROT 5.2* 5.3* 5.4* 5.5* 5.2*  ALBUMIN 1.9* 1.8* 1.9* 1.9* 1.8*   CBC:  Recent Labs Lab 01/27/13 0455 01/28/13 0500 01/29/13 0505 01/30/13 0440 01/31/13 0518 02/01/13 0518  WBC 16.8* 17.9* 18.0* 17.5* 16.8* 15.3*  NEUTROABS 13.5*  --   --   --   --   --   HGB 11.2* 11.3* 11.5* 11.4* 12.0* 10.5*  HCT 33.0* 33.7* 33.7* 33.2* 34.8* 30.8*  MCV 106.5* 107.0* 106.6* 104.7* 105.5* 106.6*  PLT 290 293 299 322 322 257     Scheduled Meds: . antiseptic oral rinse  15 mL Mouth Rinse BID  . diphenhydrAMINE  25 mg Intravenous Once  . enoxaparin (LOVENOX) injection  40 mg Subcutaneous Q24H  . feeding supplement  1 Container Oral BID BM  . folic acid  1 mg Oral Daily  . ketorolac  30 mg Intravenous Once  . lactulose  10 g Oral QID  . levETIRAcetam  1,000 mg Oral BID  . methylPREDNISolone (SOLU-MEDROL) injection  40 mg Intravenous Q24H  . metoCLOPramide (REGLAN) injection  10 mg Intravenous Once  . multivitamin with minerals  1 tablet Oral Daily  . pantoprazole  40 mg Oral BID  . rifaximin  550 mg Oral TID  . sodium chloride  3 mL Intravenous Q12H  . thiamine  100 mg Oral Daily   Continuous Infusions:    Debbora Presto, MD  [facility] Pager [phone]  If 7PM-7AM, please contact night-coverage [email or web address] Password [email or web address] 02/01/2013, 7:38 AM   LOS: 19 days

## 2013-02-01 NOTE — Progress Notes (Signed)
Clinical Social Work Department CLINICAL SOCIAL WORK PLACEMENT NOTE 02/01/2013  Patient:  Eric Mueller, Eric Mueller  Account Number:  [id number] Admit date:  01/13/2013  Clinical Social Worker:  Doroteo Glassman  Date/time:  02/01/2013 03:27 PM  Clinical Social Work is seeking post-discharge placement for this patient at the following level of care:   SKILLED NURSING   (*CSW will update this form in Epic as items are completed)   02/01/2013  Patient/family provided with [facility] Department of Clinical Social Work's list of facilities offering this level of care within the geographic area requested by the patient (or if unable, by the patient's family).  02/01/2013  Patient/family informed of their freedom to choose among providers that offer the needed level of care, that participate in Medicare, Medicaid or managed care program needed by the patient, have an available bed and are willing to accept the patient.  02/01/2013  Patient/family informed of [facility]' ownership interest in [facility], as well as of the fact that they are under no obligation to receive care at this facility.  PASARR submitted to EDS on 02/01/2013 PASARR number received from EDS on 02/01/2013  FL2 transmitted to all facilities in geographic area requested by pt/family on  02/01/2013 FL2 transmitted to all facilities within larger geographic area on   Patient informed that his/her managed care company has contracts with or will negotiate with  certain facilities, including the following:     Patient/family informed of bed offers received:   Patient chooses bed at  Physician recommends and patient chooses bed at    Patient to be transferred to  on   Patient to be transferred to facility by   The following physician request were entered in Epic:   Additional Comments:  Providence Crosby, Theresia Majors Clinical Social Work [phone]

## 2013-02-02 LAB — COMPREHENSIVE METABOLIC PANEL
ALT: 112 U/L — ABNORMAL HIGH (ref 0–53)
AST: 172 U/L — ABNORMAL HIGH (ref 0–37)
Alkaline Phosphatase: 132 U/L — ABNORMAL HIGH (ref 39–117)
CO2: 28 mEq/L (ref 19–32)
Calcium: 8.4 mg/dL (ref 8.4–10.5)
GFR calc non Af Amer: >90 mL/min (ref >90)
Glucose, Bld: 119 mg/dL — ABNORMAL HIGH (ref 70–99)
Potassium: 3.3 mEq/L — ABNORMAL LOW (ref 3.5–5.1)
Sodium: 134 mEq/L — ABNORMAL LOW (ref 135–145)
Total Bilirubin: 18.7 mg/dL (ref 0.3–1.2)
Total Protein: 5.4 g/dL — ABNORMAL LOW (ref 6.0–8.3)

## 2013-02-02 LAB — CBC
Hemoglobin: 10.4 g/dL — ABNORMAL LOW (ref 13.0–17.0)
MCH: 35.9 pg — ABNORMAL HIGH (ref 26.0–34.0)
MCHC: 33.7 g/dL (ref 30.0–36.0)
Platelets: 270 10*3/uL (ref 150–400)
RBC: 2.9 MIL/uL — ABNORMAL LOW (ref 4.22–5.81)
RDW: 13 % (ref 11.5–15.5)

## 2013-02-02 MED ORDER — THIAMINE HCL 100 MG PO TABS: 100.0000 mg | ORAL_TABLET | Freq: Every day | ORAL | Status: DC

## 2013-02-02 MED ORDER — TRAMADOL HCL 50 MG PO TABS: 50.0000 mg | ORAL_TABLET | Freq: Two times a day (BID) | ORAL | Status: DC | PRN

## 2013-02-02 MED ORDER — LORAZEPAM 0.5 MG PO TABS: 0.5000 mg | ORAL_TABLET | Freq: Three times a day (TID) | ORAL | Status: DC | PRN

## 2013-02-02 MED ORDER — POTASSIUM CHLORIDE CRYS ER 20 MEQ PO TBCR
40.0000 meq | EXTENDED_RELEASE_TABLET | Freq: Once | ORAL | Status: AC
  Administered 2013-02-02: 14:00:00 40 meq via ORAL
  Filled 2013-02-02: qty 2

## 2013-02-02 MED ORDER — ONDANSETRON HCL 4 MG PO TABS: 4.0000 mg | ORAL_TABLET | Freq: Four times a day (QID) | ORAL | Status: DC | PRN

## 2013-02-02 MED ORDER — RIFAXIMIN 550 MG PO TABS: 550.0000 mg | ORAL_TABLET | Freq: Three times a day (TID) | ORAL | Status: DC

## 2013-02-02 MED ORDER — DIPHENHYDRAMINE-ZINC ACETATE 2-0.1 % EX CREA: TOPICAL_CREAM | Freq: Two times a day (BID) | CUTANEOUS | Status: DC | PRN

## 2013-02-02 MED ORDER — LACTULOSE 10 GM/15ML PO SOLN: 10.0000 g | Freq: Four times a day (QID) | ORAL | Status: DC

## 2013-02-02 MED ORDER — HYDROMORPHONE HCL 2 MG PO TABS: 1.0000 mg | ORAL_TABLET | Freq: Two times a day (BID) | ORAL | Status: DC | PRN

## 2013-02-02 MED ORDER — FOLIC ACID 1 MG PO TABS: 1.0000 mg | ORAL_TABLET | Freq: Every day | ORAL | Status: DC

## 2013-02-02 MED ORDER — PANTOPRAZOLE SODIUM 40 MG PO TBEC: 40.0000 mg | DELAYED_RELEASE_TABLET | Freq: Two times a day (BID) | ORAL | Status: DC

## 2013-02-02 MED ORDER — DIPHENHYDRAMINE HCL 25 MG PO CAPS: 25.0000 mg | ORAL_CAPSULE | Freq: Three times a day (TID) | ORAL | Status: DC | PRN

## 2013-02-02 NOTE — Progress Notes (Signed)
Clinical Social Work  CSW met with patient, mom and grandmother at bedside. Patient and family aware of DC plans today. CSW explained information has been sent to [facility] but awaiting a return call to determine if patient has been accepted. CSW explained if [facility] is unable to accept then resources have been given several times for other SA treatment options.   CSW will continue to follow.  [place], [place] [phone]

## 2013-02-02 NOTE — Discharge Summary (Signed)
Physician Discharge Summary  Eric Mueller [id number]:[id number] DOB: Feb 02, 1986 DOA: 01/13/2013  PCP: Lucilla Edin, MD  Admit date: 01/13/2013 Discharge date: 02/02/2013  Recommendations for Outpatient Follow-up:  1. Pt will need to follow up with PCP in 2-3 weeks post discharge 2. Please obtain BMP to evaluate electrolytes and kidney function 3. Please also check CBC to evaluate Hg and Hct levels 4. He function test checked as well  Discharge Diagnoses: Acute alcoholic hepatitis Principal Problem:   Hepatitis Active Problems:   Seizure disorder   Transaminitis   Hypokalemia   Alcohol abuse   Alcohol withdrawal   Thrombocytopenia   Hypomagnesemia   Ulceration, tongue traumatic   Coagulopathy   Acute pulmonary edema   Alcoholic cardiomyopathy   Acute respiratory failure with hypoxia   Pleural effusion, bilateral   Acute liver disease   Protein-calorie malnutrition, severe    Discharge Condition: Stable  Diet recommendation: Heart healthy diet discussed in details   History of present illness:  Eric Mueller is a 27 y.o. male has a past medical history significant for alcohol abuse, with a prior hospitalization in January of 2014 for acute pancreatitis, another hospitalization in July for alcohol withdrawal seizures, presented to the emergency room with a chief complaint of yellowing of his eyes, fatigue, easy bruising, progressively worsening over the past 2 weeks. Patient was admitted with alcoholic hepatitis and subsequently admitted to ICU for progressive acute encephalopathy/DTs. Mental status slowly improved and transferred to floor 01/20/2013.   Assessment/Plan:  Acute on chronic liver injury/alcoholic hepatitis  - Bilirubin trending down, patient clinically he is stable this morning and continues to improve - I have encouraged continuing PT while inpatient  And patient has tolerated well - confusion is now improved and at pt's baseline  - Plan on discharging  today Severe malnutrition  - appreciate nutritionist recommendations  - pt tolerating PO intake  Leukocytosis  - afebrile, no evidence of infection, may be due to steroids  - WBC trending down  - UA and CXR unremarkable  Seizure  - continue keppra. 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 poor nutritional status/alcohol, supplemented and K is within normal limits this AM  Coagulopathy  - INR normalized to 1.36 on 9/21. No bleeding.  ETOH abuse/Severe DTs  - resolved  DVT Prophylaxis  - SCDs. Lovenox   Code Status: Full  Family Communication: Pt at bedside, no other family Disposition Plan: plan d/c home as patient was denied admission to rehabilitation center  Consultants:  GI SIGNIFICANT EVENTS / STUDIES:  9/16 - [place] abd/pelvis>>> Portal and hepatic veins are patent with normal flow. Hepatomegally, GB sludge.  9/19 - [place] abd>>>no ascites  9/19 - Echo>>>EF 45-55% diffuse hypokinesis  CULTURES:  MRSA PCR 9/16>>neg  Pleural Fluid Culture 9/20>>>  ANTIBIOTICS:  Unasyn 9/20>>>9/22    Discharge Exam: Filed Vitals:   02/02/13 0502  BP: 111/68  Pulse: 64  Temp: 98.1 F (36.7 C)  Resp: 14   Filed Vitals:   02/01/13 1442 02/01/13 2132 02/02/13 0502 02/02/13 0503  BP: 123/80 127/60 111/68   Pulse: 63 68 64   Temp: 98 F (36.7 C) 98.7 F (37.1 C) 98.1 F (36.7 C)   TempSrc: Oral Oral Oral   Resp: 16 18 14    Height:      Weight:    61.8 kg (136 lb 3.9 oz)  SpO2:  100% 100% 99%     General: Pt is alert, follows commands appropriately, not in acute distress, jaundice sclera but significantly improved Cardiovascular: Regular rate and rhythm, S1/S2 +, no murmurs, no rubs, no gallops Respiratory: Clear to auscultation bilaterally, no wheezing, no crackles, no rhonchi Abdominal: Soft, non tender, non distended, bowel sounds +, no  guarding Extremities: no edema, no cyanosis, pulses palpable bilaterally DP and PT Neuro: Grossly nonfocal  Discharge Instructions  Discharge Orders   Future Orders Complete By Expires   Diet - low sodium heart healthy  As directed    Increase activity slowly  As directed        Medication List    STOP taking these medications       ibuprofen 200 MG tablet  Commonly known as:  ADVIL,MOTRIN      TAKE these medications       diphenhydrAMINE 25 mg capsule  Commonly known as:  BENADRYL  Take 1 capsule (25 mg total) by mouth every 8 (eight) hours as needed for itching.     diphenhydrAMINE-zinc acetate cream  Commonly known as:  BENADRYL  Apply topically 2 (two) times daily as needed for itching.     folic acid 1 MG tablet  Commonly known as:  FOLVITE  Take 1 tablet (1 mg total) by mouth daily.     HYDROmorphone 2 MG tablet  Commonly known as:  DILAUDID  Take 0.5 tablets (1 mg total) by mouth every 12 (twelve) hours as needed.     lactulose 10 GM/15ML solution  Commonly known as:  CHRONULAC  Take 15 mLs (10 g total) by mouth 4 (four) times daily.     levETIRAcetam 1000 MG tablet  Commonly known as:  KEPPRA  Take 1 tablet (1,000 mg total) by mouth every 12 (twelve) hours.     LORazepam 0.5 MG tablet  Commonly known as:  ATIVAN  Take 1 tablet (0.5 mg total) by mouth every 8 (eight) hours as needed for anxiety.     multivitamin with minerals Tabs tablet  Take 1 tablet by mouth daily.     ondansetron 4 MG tablet  Commonly known as:  ZOFRAN  Take 1 tablet (4 mg total) by mouth every 6 (six) hours as needed for nausea.     pantoprazole 40 MG tablet  Commonly known as:  PROTONIX  Take 1 tablet (40 mg total) by mouth 2 (two) times daily.     potassium chloride SA 20 MEQ tablet  Commonly known as:  K-DUR,KLOR-CON  Take 1 tablet (20 mEq total) by mouth 2 (two) times daily.     rifaximin 550 MG Tabs tablet  Commonly known as:  XIFAXAN  Take 1 tablet (550 mg total)  by mouth 3 (three) times daily.     thiamine 100 MG tablet  Take 1 tablet (100 mg total) by mouth daily.     traMADol 50 MG tablet  Commonly known as:  ULTRAM  Take 1 tablet (50 mg total) by mouth every 12 (twelve) hours as needed.           Follow-up Information   Follow up with DAUB, STEVE A, MD In 2 weeks.   Specialty:  Family Medicine   Contact information:   [address] [place] [address] [phone]       Follow up with HUNG,PATRICK D, MD In 2 weeks.   Specialty:  Gastroenterology   Contact information:   [address] [place] [address] [phone]  The results of significant diagnostics from this hospitalization (including imaging, microbiology, ancillary and laboratory) are listed below for reference.     Microbiology: No results found for this or any previous visit (from the past 240 hour(s)).   Labs: Basic Metabolic Panel:  Recent Labs Lab 01/28/13 0500 01/29/13 0505 01/30/13 0440 01/31/13 0518 02/01/13 0518 02/02/13 0440  NA 135 136 135 135 134* 134*  K 3.1* 3.0* 3.3* 2.9* 3.5 3.3*  CL 97 96 95* 96 97 97  CO2 31 31 30 30 27 28   GLUCOSE 101* 108* 106* 107* 98 119*  BUN 9 9 10 9 9 10   CREATININE 0.66 0.70 0.67 0.83 0.66 0.65  CALCIUM 8.6 8.6 8.7 8.6 8.3* 8.4  MG 2.1  --   --   --   --   --   PHOS 3.1  --   --   --   --   --    Liver Function Tests:  Recent Labs Lab 01/29/13 0505 01/30/13 0440 01/31/13 0518 02/01/13 0518 02/02/13 0440  AST 151* 157* 156* 159* 172*  ALT 87* 94* 98* 104* 112*  ALKPHOS 141* 136* 137* 131* 132*  BILITOT 27.5* 27.7* 23.8* 20.2* 18.7*  PROT 5.3* 5.4* 5.5* 5.2* 5.4*  ALBUMIN 1.8* 1.9* 1.9* 1.8* 1.9*   CBC:  Recent Labs Lab 01/27/13 0455  01/29/13 0505 01/30/13 0440 01/31/13 0518 02/01/13 0518 02/02/13 0440  WBC 16.8*  < > 18.0* 17.5* 16.8* 15.3* 15.9*  NEUTROABS 13.5*  --   --   --   --   --   --   HGB 11.2*  < > 11.5* 11.4* 12.0* 10.5* 10.4*  HCT 33.0*  < >  33.7* 33.2* 34.8* 30.8* 30.9*  MCV 106.5*  < > 106.6* 104.7* 105.5* 106.6* 106.6*  PLT 290  < > 299 322 322 257 270  < > = values in this interval not displayed. : BNP (last 3 results)  Recent Labs  01/16/13 1230 01/19/13 0350  PROBNP 282.4* 41.7   SIGNED: Time coordinating discharge: Over 30 minutes  Debbora Presto, MD  [facility] 02/02/2013, 12:18 PM Pager [phone]  If 7PM-7AM, please contact night-coverage [email or web address] Password [email or web address]

## 2013-02-02 NOTE — Progress Notes (Signed)
Clinical Social Work  CSW spoke with [facility] who reports that they cannot accept patient due to being too medically complex. [facility] recommended [facility] in [place] or [facility] in [place] who take patients with medical needs that also want treatment.  CSW met with patient and family at bedside in order to discuss DC plans. Patient and family aware that [facility] is not an option at this time. CSW provided other referrals and spoke with them about intensive outpatient resources again. At this time, patient got up and left the room. CSW provided mom with list and encouraged her to assist patient with resources.   Mom is understanding that CSW cannot force patient to go for treatment. Family reports no further needs at this time.  [place], [place] [phone]

## 2013-02-02 NOTE — Progress Notes (Signed)
Pt discharged home via family; Pt and family given and explained all discharge instructions, carenotes, and prescriptions; pt and family stated understanding and denied questions/concerns; all f/u appointments in place; IV removed without complicaitons; pt stable at time of discharge  

## 2013-02-02 NOTE — Progress Notes (Signed)
Informed patient and his aunt at bedside that patient was discharged; pt did not seem happy about this decision. MD at spoken to patient in great detail that am about being discharged and about options for discharge. Pt started tapping his drum sticks and would not make eye contact or answer questions about why he did not want to go home. [facility] at this time.

## 2013-02-02 NOTE — Progress Notes (Signed)
NUTRITION FOLLOW-UP  DOCUMENTATION CODES Per approved criteria  -Severe malnutrition in the context of acute illness   INTERVENTION: Continue Resource Breeze po BID, each supplement provides 250 kcal and 9 grams of protein. Discontinue Ensure Complete. Agree with Regular, liberalized diet. Oral intake is adequate at this time. Encouraged adequate protein intake when d/c. RD to continue to follow nutrition care plan.  NUTRITION DIAGNOSIS: Inadequate oral intake related to poor appetite as evidenced by poor meal intake and ongoing weight loss. Resolved.  Goal: Intake to meet >90% of estimated nutrition needs. Improved.  Monitor:  weight trends, lab trends, I/O's, PO intake, supplement tolerance  ASSESSMENT: PMHx significant for ETOH abuse, pancreatitis, seizures. Admitted with several falls, jaundice and poor appetite. Work-up reveals acute on chronic liver injury. Pt underwent severe DTs and was transferred to ICU. Developed progressive hypoxia, pulmonary edema and pleural effusions while in ICU.  Underwent thoracentesis of bilateral pleural effusions on 9/20. Transferred to telemetry 9/23. Began to develop worsening n/v on 9/24. Lactulose added 10/1. Per chart review, GI MD had long discussion with patient and family regarding prognosis - with 75% chance of mortality within the next 6 months. Discussed patient during progression rounds.  Pt was pursuing d/c to [facility], however not medically stable at this time; dispo plans pending. Per MD, likely to d/c today.  Continues with orders for Regular diet and Resource Breeze oral nutrition supplements. Pt states that his appetite is doing well. Ate an entire footlong meatball sub from [employer] last night. Encouraged protein intake.  Pt meets criteria for severe MALNUTRITION in the context of chronic illness as evidenced by 7% wt loss x <2 weeks and intake of <75% x at least 1 month.  Potassium is currently low. Most recent magnesium  and phosphorus WNL.  Height: Ht Readings from Last 1 Encounters:  01/15/13 5\' 11"  (1.803 m)    Weight: Wt Readings from Last 1 Encounters:  02/02/13 136 lb 3.9 oz (61.8 kg)  Admit wt 151 lb.  BMI:  Body mass index is 19.01 kg/(m^2). WNL  Estimated Nutritional Needs: Kcal: 1950-2150 Protein: at least 83 grams daily Fluid: 2 liters daily  Skin: intact  Diet Order: General  EDUCATION NEEDS: -Education needs addressed   Intake/Output Summary (Last 24 hours) at 02/02/13 1017 Last data filed at 02/02/13 1000  Gross per 24 hour  Intake    663 ml  Output    650 ml  Net     13 ml    Last BM: 10/5  Labs:   Recent Labs Lab 01/28/13 0500  01/31/13 0518 02/01/13 0518 02/02/13 0440  NA 135  < > 135 134* 134*  K 3.1*  < > 2.9* 3.5 3.3*  CL 97  < > 96 97 97  CO2 31  < > 30 27 28   BUN 9  < > 9 9 10   CREATININE 0.66  < > 0.83 0.66 0.65  CALCIUM 8.6  < > 8.6 8.3* 8.4  MG 2.1  --   --   --   --   PHOS 3.1  --   --   --   --   GLUCOSE 101*  < > 107* 98 119*  < > = values in this interval not displayed.  CBG (last 3)  No results found for this basename: GLUCAP,  in the last 72 hours  Scheduled Meds: . antiseptic oral rinse  15 mL Mouth Rinse BID  . diphenhydrAMINE  25 mg Intravenous Once  .  enoxaparin (LOVENOX) injection  40 mg Subcutaneous Q24H  . feeding supplement  1 Container Oral BID BM  . folic acid  1 mg Oral Daily  . lactulose  10 g Oral QID  . levETIRAcetam  1,000 mg Oral BID  . methylPREDNISolone (SOLU-MEDROL) injection  40 mg Intravenous Q24H  . metoCLOPramide (REGLAN) injection  10 mg Intravenous Once  . multivitamin with minerals  1 tablet Oral Daily  . pantoprazole  40 mg Oral BID  . rifaximin  550 mg Oral TID  . sodium chloride  3 mL Intravenous Q12H  . thiamine  100 mg Oral Daily    Continuous Infusions:  none  Jarold Motto MS, RD, LDN Pager: [phone] After-hours pager: [phone]

## 2013-02-02 NOTE — Progress Notes (Signed)
Clinical Social Work  CSW received a call from [facility] who reports that they are still holding patient's bed but need updated clinicals. Information faxed. CSW will continue to follow.  [place], [place] [phone]

## 2013-02-02 NOTE — Progress Notes (Signed)
Physical Therapy Discharge Patient Details Name: Eric Mueller MRN: [id number] DOB: 12-Jul-1985 Today's Date: 02/02/2013 Time:  -     Patient discharged from PT services secondary to Pt screened for  Acute needs. Pt actually observed on 6E yesterday  ambulating and took himself down the steps as pt did not stop and return to the elevator when asked to do so. of the pt was. Pt can obtain a cane at DC. Pt reports ambulating outside with father yesterday. No acute needs..  Please see latest therapy progress note for current level of functioning and progress toward goals.  PT had been evaluated recently and no needs.   Progress and discharge plan discussed with patient .  GP     Sharen Heck PT [phone]  02/02/2013, 12:05 PM

## 2013-02-06 ENCOUNTER — Ambulatory Visit (INDEPENDENT_AMBULATORY_CARE_PROVIDER_SITE_OTHER): Payer: BC Managed Care – PPO | Admitting: Physician Assistant

## 2013-02-06 ENCOUNTER — Other Ambulatory Visit: Payer: Self-pay | Admitting: Physician Assistant

## 2013-02-06 VITALS — BP 112/68 | HR 60 | Temp 98.6°F | Ht 70.0 in | Wt 139.0 lb

## 2013-02-06 DIAGNOSIS — B370 Candidal stomatitis: Secondary | ICD-10-CM

## 2013-02-06 DIAGNOSIS — R160 Hepatomegaly, not elsewhere classified: Secondary | ICD-10-CM

## 2013-02-06 DIAGNOSIS — E876 Hypokalemia: Secondary | ICD-10-CM

## 2013-02-06 DIAGNOSIS — D696 Thrombocytopenia, unspecified: Secondary | ICD-10-CM

## 2013-02-06 DIAGNOSIS — K769 Liver disease, unspecified: Secondary | ICD-10-CM

## 2013-02-06 DIAGNOSIS — B3781 Candidal esophagitis: Secondary | ICD-10-CM

## 2013-02-06 DIAGNOSIS — Z111 Encounter for screening for respiratory tuberculosis: Secondary | ICD-10-CM

## 2013-02-06 DIAGNOSIS — D72829 Elevated white blood cell count, unspecified: Secondary | ICD-10-CM

## 2013-02-06 LAB — POCT CBC
Granulocyte percent: 83 %G — AB (ref 37–80)
Hemoglobin: 11.2 g/dL — AB (ref 14.1–18.1)
MCH, POC: 35.1 pg — AB (ref 27–31.2)
MCV: 112.4 fL — AB (ref 80–97)
MID (cbc): 1.2 — AB (ref 0–0.9)
MPV: 9.6 fL (ref 0–99.8)
POC Granulocyte: 14.6 — AB (ref 2–6.9)
POC LYMPH PERCENT: 10 %L (ref 10–50)
POC MID %: 7 %M (ref 0–12)
Platelet Count, POC: 251 10*3/uL (ref 142–424)
RBC: 3.19 M/uL — AB (ref 4.69–6.13)
RDW, POC: 12.3 %
WBC: 17.6 10*3/uL — AB (ref 4.6–10.2)

## 2013-02-06 LAB — AMYLASE: Amylase: 13 U/L (ref 0–105)

## 2013-02-06 MED ORDER — NYSTATIN 100000 UNIT/ML MT SUSP: 500000.0000 [IU] | Freq: Four times a day (QID) | OROMUCOSAL | Status: DC

## 2013-02-06 NOTE — Progress Notes (Signed)
Subjective:    Patient ID: Eric Mueller, male    DOB: 05-02-1985, 27 y.o.   MRN: [id number]  PCP: Lucilla Edin, MD  HPI  This 27 y.o. male presents for follow-up after recent hospitalization (9/16-10/06) for acute alcoholic hepatitis. Needs labs: CBC, BMET and LFTs.  Additionally, he needs TB test and FL-2 for placement in a SNF.  Needs physical rehab and nutrition, medication administration.  Unable to perform ADLs independently. Already Detoxed during hospitalization. His father works full-time as a Radiation protection practitioner.  His mother and step-mother and aunt are also very involved in his care and work full-time. The family is having a difficult time finding placement in a facility to provide care. Plan to see Dr. Cleta Alberts on 02/08/2013 and then have an appointment with Dr. Elnoria Howard next week   Review of Systems HA, fatigue, jaundice. No CP, SOB, dizziness.    Objective:   Physical Exam  Vitals reviewed. Constitutional: He is oriented to person, place, and time. Vital signs are normal. He appears well-developed. He is cooperative. He appears ill. No distress.  Jaundiced.  The patient's father states that the current jaundice is improved compared with hospital discharge. He is weak, and moves slowly.  HENT:  Head: Normocephalic and atraumatic.  Right Ear: External ear normal.  Left Ear: External ear normal.  Nose: Nose normal.  Mouth/Throat: Oropharyngeal exudate (white "stuck on" appearing white plaques and satellite lesions on the posterior pharynx and soft palate.) present.  Eyes: Pupils are equal, round, and reactive to light.  Neck: Normal range of motion. Neck supple. No thyromegaly present.  Cardiovascular: Normal rate, normal heart sounds and intact distal pulses.   Pulmonary/Chest: Effort normal and breath sounds normal.  Abdominal: Soft. Bowel sounds are normal. There is hepatomegaly (liver palpable approximately 15 cm below the lower rib margin). There is no splenomegaly. There is no  tenderness. There is no rebound and no CVA tenderness. No hernia.  Lymphadenopathy:    He has no cervical adenopathy.  Neurological: He is alert and oriented to person, place, and time.  Skin: Skin is warm and dry. Abrasion and ecchymosis noted. No rash noted. No pallor.  Skin lesions consistent with multiple injections and lab draws, as well as associated adhesive dressing residue.  Psychiatric: Thought content normal. His affect is not inappropriate.  He has a somewhat odd but pleasant affect.  Slowed in general, but not inappropriate.   BP 112/68  Pulse 60  Temp(Src) 98.6 F (37 C) (Oral)  Ht 5\' 10"  (1.778 m)  Wt 139 lb (63.05 kg)  BMI 19.94 kg/m2  SpO2 99%     Results for orders placed in visit on 02/06/13  POCT CBC      Result Value Range   WBC 17.6 (*) 4.6 - 10.2 K/uL   Lymph, poc 1.8  0.6 - 3.4   POC LYMPH PERCENT 10.0  10 - 50 %L   MID (cbc) 1.2 (*) 0 - 0.9   POC MID % 7.0  0 - 12 %M   POC Granulocyte 14.6 (*) 2 - 6.9   Granulocyte percent 83.0 (*) 37 - 80 %G   RBC 3.19 (*) 4.69 - 6.13 M/uL   Hemoglobin 11.2 (*) 14.1 - 18.1 g/dL   HCT, POC 40.9 (*) 81.1 - 53.7 %   MCV 112.4 (*) 80 - 97 fL   MCH, POC 35.1 (*) 27 - 31.2 pg   MCHC 31.2 (*) 31.8 - 35.4 g/dL   RDW, POC 12.3  Platelet Count, POC 251  142 - 424 K/uL   MPV 9.6  0 - 99.8 fL       Assessment & Plan:  Acute liver disease - Plan: Basic metabolic panel, Hepatic Function Panel  Hepatomegaly  Hypokalemia  Thrombocytopenia - Plan: POCT CBC  Screening-pulmonary TB - Plan: TB Skin Test  Leukocytosis, unspecified - Likely due to recent steroid use; overall stable from recent hospitalization. Plan: Amylase, Lipase  Thrush of mouth and esophagus - Plan: nystatin (MYCOSTATIN) 100000 UNIT/ML suspension  Follow-up as planned with Dr. Cleta Alberts.  The father has a blank FL-2 form for completion and will bring it with them upon return for PPD reading.  Discussed with Dr. Merla Riches.  Fernande Bras,  PA-C Physician Assistant-Certified [facility]

## 2013-02-07 LAB — BASIC METABOLIC PANEL
BUN: 7 mg/dL (ref 6–23)
Chloride: 97 mEq/L (ref 96–112)
Potassium: 4 mEq/L (ref 3.5–5.3)
Sodium: 135 mEq/L (ref 135–145)

## 2013-02-07 LAB — HEPATIC FUNCTION PANEL
ALT: 147 U/L — ABNORMAL HIGH (ref 0–53)
AST: 216 U/L — ABNORMAL HIGH (ref 0–37)
Bilirubin, Direct: 8.2 mg/dL — ABNORMAL HIGH (ref 0.0–0.3)
Indirect Bilirubin: 9.1 mg/dL — ABNORMAL HIGH (ref 0.0–0.9)
Total Bilirubin: 17.3 mg/dL — ABNORMAL HIGH (ref 0.3–1.2)
Total Protein: 5.7 g/dL — ABNORMAL LOW (ref 6.0–8.3)

## 2013-02-08 ENCOUNTER — Ambulatory Visit (INDEPENDENT_AMBULATORY_CARE_PROVIDER_SITE_OTHER): Payer: BC Managed Care – PPO | Admitting: Emergency Medicine

## 2013-02-08 ENCOUNTER — Telehealth: Payer: Self-pay | Admitting: Radiology

## 2013-02-08 VITALS — BP 120/78 | HR 68 | Temp 98.2°F | Resp 16 | Ht 69.5 in | Wt 139.4 lb

## 2013-02-08 DIAGNOSIS — K769 Liver disease, unspecified: Secondary | ICD-10-CM

## 2013-02-08 DIAGNOSIS — R569 Unspecified convulsions: Secondary | ICD-10-CM

## 2013-02-08 DIAGNOSIS — K7290 Hepatic failure, unspecified without coma: Secondary | ICD-10-CM

## 2013-02-08 NOTE — Progress Notes (Addendum)
Subjective:  This chart was scribed for Eric Gobble, MD by Eric Mueller, ED Scribe. This patient was seen in room Room 13 and the patient's care was started 2:14 PM.    Patient ID: Eric Mueller, male    DOB: [birth year], 28 y.o.   MRN: [id number]  HPI HPI Comments: Eric Mueller is a 27 y.o. male who presents to [facility] seeking follow up today from his hospital visit on 9/16. Pt states he is planning to seek help via a rehabilitation clinic. Pt states he hasn't been drinking a lot in the last year. He says the last time he drank was in February before he was diagnosed with pancreatitis. Pt states he gets nauseated at times, but is able to eat. He states he is trying to eat more. Pt states he hasn't had any abdominal pain, or tension in his abdominal region. He says he is doing well processing information. He states he was walking with a cane in the hospital, and he says it has encouraged and motivated him to walk more on his own. He states he has been here working at the [employer]. He states his friend that owns the business just recently had a stroke, and he has been helping him get things organized in the store.  Review of Systems  Respiratory: Negative.   Cardiovascular: Negative.     Past Medical History  Diagnosis Date  . Seizure disorder   . Seizures   . Allergy   . Anxiety     History   Social History  . Marital Status: Single    Spouse Name: n/a    Number of Children: 0  . Years of Education: N/A   Occupational History  . musician     session work; especially percussion  .     Social History Main Topics  . Smoking status: Never Smoker   . Smokeless tobacco: Never Used  . Alcohol Use: No     Comment: no EtOH since 01/13/2013  . Drug Use: No  . Sexual Activity: Yes   Other Topics Concern  . Not on file   Social History Narrative   Lives with his mother. Father lives nearby.    Past Surgical History  Procedure Laterality Date  . Wisdom tooth extraction       Results for orders placed in visit on 02/06/13  BASIC METABOLIC PANEL      Result Value Range   Sodium 135  135 - 145 mEq/L   Potassium 4.0  3.5 - 5.3 mEq/L   Chloride 97  96 - 112 mEq/L   CO2 27  19 - 32 mEq/L   Glucose, Bld 133 (*) 70 - 99 mg/dL   BUN 7  6 - 23 mg/dL   Creat 6.96  2.95 - 2.84 mg/dL   Calcium 8.5  8.4 - 13.2 mg/dL  HEPATIC FUNCTION PANEL      Result Value Range   Total Bilirubin 17.3 (*) 0.3 - 1.2 mg/dL   Bilirubin, Direct 8.2 (*) 0.0 - 0.3 mg/dL   Indirect Bilirubin 9.1 (*) 0.0 - 0.9 mg/dL   Alkaline Phosphatase 126 (*) 39 - 117 U/L   AST 216 (*) 0 - 37 U/L   ALT 147 (*) 0 - 53 U/L   Total Protein 5.7 (*) 6.0 - 8.3 g/dL   Albumin 3.2 (*) 3.5 - 5.2 g/dL  AMYLASE      Result Value Range   Amylase 13  0 - 105 U/L  LIPASE      Result Value Range   Lipase <10  0 - 75 U/L  POCT CBC      Result Value Range   WBC 17.6 (*) 4.6 - 10.2 K/uL   Lymph, poc 1.8  0.6 - 3.4   POC LYMPH PERCENT 10.0  10 - 50 %L   MID (cbc) 1.2 (*) 0 - 0.9   POC MID % 7.0  0 - 12 %M   POC Granulocyte 14.6 (*) 2 - 6.9   Granulocyte percent 83.0 (*) 37 - 80 %G   RBC 3.19 (*) 4.69 - 6.13 M/uL   Hemoglobin 11.2 (*) 14.1 - 18.1 g/dL   HCT, POC 11.9 (*) 14.7 - 53.7 %   MCV 112.4 (*) 80 - 97 fL   MCH, POC 35.1 (*) 27 - 31.2 pg   MCHC 31.2 (*) 31.8 - 35.4 g/dL   RDW, POC 82.9     Platelet Count, POC 251  142 - 424 K/uL   MPV 9.6  0 - 99.8 fL       Objective:   Physical Exam  Constitutional: He is oriented to person, place, and time. He appears well-developed and well-nourished. No distress.  Mildly wide base gait on ambulation in the hall  HENT:  Head: Normocephalic.  Marked scleral icterus  Thrush in mouth  Eyes: Conjunctivae are normal. Pupils are equal, round, and reactive to light. No scleral icterus.  Neck: Normal range of motion. Neck supple. No thyromegaly present.  Cardiovascular: Normal rate and regular rhythm.  Exam reveals no gallop and no friction rub.   No murmur  heard. Pulmonary/Chest: Effort normal and breath sounds normal. No respiratory distress. He has no wheezes. He has no rales.  Abdominal: Soft. Bowel sounds are normal. He exhibits no distension. There is no tenderness. There is no rebound.  Liver down 4 finger breasts Non tender Spleen not palpable   Musculoskeletal: Normal range of motion.  Extremities without edema  Neurological: He is alert and oriented to person, place, and time.  Skin: Skin is warm and dry. No rash noted.  Psychiatric: He has a normal mood and affect. His behavior is normal.  Mild tremor bilaterally Oriented time place person No asterixis           Assessment & Plan:  Fl2 form was completed today. PPD reading was negative. His lab test are improved except for mild increase in SGOT and SGPT. His bilirubin has continued to improve his white count remains elevated but it has been so during his entire hospitalization. See if  we can get him admitted to the skilled nursing facility to work on nutrition,and  physical therapy to work on strengthening and get his liver time to recover so we can get him to a rehabilitation facility. His seizure disorders currently under control with Keppra. I did add a ceruloplasmin level . I was able to pull his records from January 2013. At that time he was referred to neurology at [facility] and also referred to [facility] cardiology. At that time we attempted to have an iron, TIBC, ferritin, and ceruloplasmin performed  but the test could not be added. He was advised at that time to return in one month for repeat blood work. He did not return in one month. I reviewed the CT scan that was done the earlier in the year when the patient was admitted with pancreatitis. At that time the liver images showed severe steatosis that would be consistent  with alcohol damage to the liver

## 2013-02-08 NOTE — Telephone Encounter (Signed)
Spoke with [facility] and added ceruloplasmin test with liver failure as the diagnosis per Dr Cleta Alberts

## 2013-02-09 ENCOUNTER — Telehealth: Payer: Self-pay | Admitting: Radiology

## 2013-02-09 ENCOUNTER — Other Ambulatory Visit: Payer: Self-pay | Admitting: Emergency Medicine

## 2013-02-09 ENCOUNTER — Telehealth: Payer: Self-pay | Admitting: Family Medicine

## 2013-02-09 ENCOUNTER — Telehealth: Payer: Self-pay

## 2013-02-09 DIAGNOSIS — Z0100 Encounter for examination of eyes and vision without abnormal findings: Secondary | ICD-10-CM

## 2013-02-09 NOTE — Telephone Encounter (Signed)
Faxed a copy of lab results from 02/06/2013 to Dr. Charna Elizabeth

## 2013-02-09 NOTE — Telephone Encounter (Signed)
This patient needs home health Nurse /PT / and OT evaluations done please, order in epic.

## 2013-02-09 NOTE — Addendum Note (Signed)
Addended byCaffie Damme on: 02/09/2013 02:27 PM   Modules accepted: Orders

## 2013-02-09 NOTE — Telephone Encounter (Signed)
Message copied by Caffie Damme on Mon Feb 09, 2013  4:05 PM ------      Message from: Theador Hawthorne      Created: Mon Feb 09, 2013  2:45 PM      Regarding: home health       Hey Amy      We are not taking [employer] again yet.  I am so sorry!              ~Betsy ------

## 2013-02-09 NOTE — Telephone Encounter (Signed)
Dr. Cleta Alberts wanted a Cerroloplasmia added to patients labs from 02/06/13.  Spoke with Tresa Endo at [facility], test added.

## 2013-02-09 NOTE — Telephone Encounter (Signed)
Spoke to [employer] for contact with [facility], phone [phone] will call.

## 2013-02-10 NOTE — Telephone Encounter (Signed)
Eric Mueller has sent this for [place]. Dr Cleta Alberts advised.

## 2013-02-10 NOTE — Telephone Encounter (Signed)
Referral form being faxed.

## 2013-02-11 LAB — CERULOPLASMIN: Ceruloplasmin: 48 mg/dL (ref 20–60)

## 2013-02-13 ENCOUNTER — Telehealth: Payer: Self-pay

## 2013-02-13 NOTE — Telephone Encounter (Signed)
Nurse Katherina Right with [facility] set up patient for PT / Occupational Therapy.   Was not able to get Home Health Care Aid - insurance will not cover this.  Will be out to see him on regular basis.  Wanted Dr. Cleta Alberts to know this has been taken care of and will keep him posted.

## 2013-02-13 NOTE — Telephone Encounter (Signed)
To you FYI  

## 2013-02-13 NOTE — Telephone Encounter (Signed)
Call patient and advised him he needs to come in next week for blood work. Please verify this with his father so he knows when to come in. He did come in on Thursday to have his blood work done.

## 2013-02-16 NOTE — Telephone Encounter (Signed)
Patient is still trying to establish at either [facility] or [facility], father is to call with intake information, so DrDaub can advise facility of his needs. FL2 form was filled out by Dr Cleta Alberts

## 2013-02-17 NOTE — Telephone Encounter (Signed)
He is at [facility]. Dr Cleta Alberts awar.

## 2013-02-20 ENCOUNTER — Non-Acute Institutional Stay: Payer: Self-pay | Admitting: Nurse Practitioner

## 2013-02-20 ENCOUNTER — Encounter: Payer: Self-pay | Admitting: Nurse Practitioner

## 2013-02-23 ENCOUNTER — Encounter: Payer: Self-pay | Admitting: Internal Medicine

## 2013-02-23 ENCOUNTER — Non-Acute Institutional Stay: Payer: BC Managed Care – PPO | Admitting: Internal Medicine

## 2013-02-23 DIAGNOSIS — K219 Gastro-esophageal reflux disease without esophagitis: Secondary | ICD-10-CM

## 2013-02-23 DIAGNOSIS — R531 Weakness: Secondary | ICD-10-CM

## 2013-02-23 DIAGNOSIS — F411 Generalized anxiety disorder: Secondary | ICD-10-CM

## 2013-02-23 DIAGNOSIS — R5381 Other malaise: Secondary | ICD-10-CM

## 2013-02-23 DIAGNOSIS — K7290 Hepatic failure, unspecified without coma: Secondary | ICD-10-CM

## 2013-02-23 DIAGNOSIS — K7010 Alcoholic hepatitis without ascites: Secondary | ICD-10-CM

## 2013-02-23 DIAGNOSIS — E876 Hypokalemia: Secondary | ICD-10-CM

## 2013-02-23 DIAGNOSIS — G40909 Epilepsy, unspecified, not intractable, without status epilepticus: Secondary | ICD-10-CM

## 2013-02-23 DIAGNOSIS — E43 Unspecified severe protein-calorie malnutrition: Secondary | ICD-10-CM

## 2013-02-23 NOTE — Progress Notes (Signed)
Patient ID: Eric Mueller, male   DOB: 02-07-1986, 27 y.o.   MRN: [id number]  [facility]    PCP: Lucilla Edin, MD  Code Status: full code  Allergies  Allergen Reactions  . Oxycodone Nausea And Vomiting    Chief Complaint  Patient presents with  . Medical Managment of Chronic Issues    new admit     HPI:  27 y/o male patient is her for STR post hospital admission from 01/13/13- 02/02/13 for alcoholic hepatitis and seizure disorder. He was then discharged home and has come to SNF for STR due to his weakness and poor nutritional status He was seen in his room today with nursing supervisor present. He denies any complaints. He has been working with therapy team. No recent seizure episodes  Review of Systems  Constitutional: Negative for fever, chills, weight loss, malaise/fatigue and diaphoresis.  HENT: Negative for congestion, hearing loss and sore throat.   Eyes: Negative for blurred vision, double vision and discharge.  Respiratory: Negative for cough, sputum production, shortness of breath and wheezing.   Cardiovascular: Negative for chest pain, palpitations, orthopnea and leg swelling.  Gastrointestinal: Negative for heartburn, nausea, vomiting, abdominal pain, diarrhea and constipation.  Genitourinary: Negative for dysuria, urgency, frequency and flank pain.  Musculoskeletal: Negative for back pain, falls, joint pain and myalgias.  Skin: Negative for itching and rash.  Neurological: Positive for weakness. Negative for dizziness, tingling, focal weakness and headaches.  Psychiatric/Behavioral: Negative for depression and memory loss. The patient is not nervous/anxious.     Past Medical History  Diagnosis Date  . Seizure disorder   . Seizures   . Allergy   . Anxiety    Past Surgical History  Procedure Laterality Date  . Wisdom tooth extraction     Social History:   reports that he has never smoked. He has never used smokeless tobacco. He reports that he does not  drink alcohol or use illicit drugs.  Family History  Problem Relation Age of Onset  . Heart disease      cardiovascular disease  . Cancer      Medications: Patient's Medications  New Prescriptions   No medications on file  Previous Medications   DIPHENHYDRAMINE (BENADRYL) 25 MG CAPSULE    Take 1 capsule (25 mg total) by mouth every 8 (eight) hours as needed for itching.   FOLIC ACID (FOLVITE) 1 MG TABLET    Take 1 tablet (1 mg total) by mouth daily.   LACTULOSE (CHRONULAC) 10 GM/15ML SOLUTION    Take 15 mLs (10 g total) by mouth 4 (four) times daily.   LEVETIRACETAM (KEPPRA) 1000 MG TABLET    Take 1 tablet (1,000 mg total) by mouth every 12 (twelve) hours.   LORAZEPAM (ATIVAN) 0.5 MG TABLET    Take 1 tablet (0.5 mg total) by mouth every 8 (eight) hours as needed for anxiety.   MULTIPLE VITAMIN (MULTI-VITAMINS) TABS    Take 1 tablet by mouth daily.   ONDANSETRON (ZOFRAN) 4 MG TABLET    Take 1 tablet (4 mg total) by mouth every 6 (six) hours as needed for nausea.   POTASSIUM CHLORIDE SA (K-DUR,KLOR-CON) 20 MEQ TABLET    Take 1 tablet (20 mEq total) by mouth 2 (two) times daily.   RIFAXIMIN (XIFAXAN) 550 MG TABS TABLET    Take 1 tablet (550 mg total) by mouth 3 (three) times daily.   THIAMINE 100 MG TABLET    Take 1 tablet (100 mg total) by mouth  daily.   TRAMADOL (ULTRAM) 50 MG TABLET    Take 1 tablet (50 mg total) by mouth every 12 (twelve) hours as needed.  Modified Medications   Modified Medication Previous Medication   PANTOPRAZOLE (PROTONIX) 40 MG TABLET pantoprazole (PROTONIX) 40 MG tablet      Take 40 mg by mouth daily.    Take 1 tablet (40 mg total) by mouth 2 (two) times daily.  Discontinued Medications   HYDROMORPHONE (DILAUDID) 2 MG TABLET    Take 0.5 tablets (1 mg total) by mouth every 12 (twelve) hours as needed.   NYSTATIN (MYCOSTATIN) 100000 UNIT/ML SUSPENSION    Take 5 mLs (500,000 Units total) by mouth 4 (four) times daily. Swish and spit   POTASSIUM PHOSPHATE,  MONOBASIC, (K-PHOS ORIGINAL) 500 MG TABLET    Take 500 mg by mouth 2 (two) times daily.     Physical Exam: Filed Vitals:   02/23/13 1528  BP: 125/69  Pulse: 74  Temp: 98.2 F (36.8 C)  Resp: 16  SpO2: 96%   General- young adult male in no acute distress Head- atraumatic, normocephalic Eyes- PERRLA, EOMI, pallor present, icterus present Neck- no lymphadenopathy, no thyromegaly, no jugular vein distension, no carotid bruit Chest- no chest wall deformities, no chest wall tenderness Cardiovascular- normal s1,s2, no murmurs/ rubs/ gallops Respiratory- bilateral clear to auscultation, no wheeze, no rhonchi, no crackles Abdomen- bowel sounds present, soft, non tender, hepatomegaly present Musculoskeletal- able to move all 4 extremities, no spinal and paraspinal tenderness, steady gait, weakness present, mild tremor bilaterally, no asterexis Neurological- no focal deficit Skin- warm and dry, no rash noted Psychiatry- alert and oriented to person, place and time, normal mood and affect   Labs reviewed: Basic Metabolic Panel:  Recent Labs  16/10/96 0603  01/26/13 0435  01/28/13 0500  02/01/13 0518 02/02/13 0440 02/06/13 1426  NA 137  --  135  --  135  < > 134* 134* 135  K 2.9*  < > 3.3*  < > 3.1*  < > 3.5 3.3* 4.0  CL 97  --  95*  --  97  < > 97 97 97  CO2 29  --  30  --  31  < > 27 28 27   GLUCOSE 106*  --  106*  --  101*  < > 98 119* 133*  BUN 8  --  9  --  9  < > 9 10 7   CREATININE 0.60  --  0.67  --  0.66  < > 0.66 0.65 0.74  CALCIUM 8.5  --  8.4  --  8.6  < > 8.3* 8.4 8.5  MG 1.9  --  1.9  --  2.1  --   --   --   --   PHOS 3.4  --  3.5  --  3.1  --   --   --   --   < > = values in this interval not displayed. Liver Function Tests:  Recent Labs  02/01/13 0518 02/02/13 0440 02/06/13 1426  AST 159* 172* 216*  ALT 104* 112* 147*  ALKPHOS 131* 132* 126*  BILITOT 20.2* 18.7* 17.3*  PROT 5.2* 5.4* 5.7*  ALBUMIN 1.8* 1.9* 3.2*    Recent Labs  11/27/12 0038  01/13/13 1949 02/06/13 1501  LIPASE 27 14 <10  AMYLASE  --   --  13    Recent Labs  01/13/13 2015  AMMONIA 43   CBC:  Recent Labs  11/27/12 0348 01/13/13 1949  01/27/13 0455  01/31/13 0518 02/01/13 0518 02/02/13 0440 02/06/13 1428  WBC 8.0 6.2  < > 16.8*  < > 16.8* 15.3* 15.9* 17.6*  NEUTROABS  --  4.5  --  13.5*  --   --   --   --   --   HGB 12.2* 10.9*  < > 11.2*  < > 12.0* 10.5* 10.4* 11.2*  HCT 34.1* 31.0*  < > 33.0*  < > 34.8* 30.8* 30.9* 35.9*  MCV 100.0 102.0*  < > 106.5*  < > 105.5* 106.6* 106.6* 112.4*  PLT 110* 98*  < > 290  < > 322 257 270  --   < > = values in this interval not displayed.  02/18/13 wbc 8.2, hb 9.5, hct 29.1, plt 190, na 138, k 3.7, glu 112, bun 10, cr 0.6, t.pro 5, alb 2.7, t.bil 7.1, alp 121, ast 87, alt 51   Assessment/Plan  Alcoholic hepatitis No signs of encephalopathy at present. Will continue his lactulose and rifaxamin along with folic acid and thiamine supplement. Abstaining from alcohol. Monitor lft periodically. Monitor mental status  Generalized weakness Needs to work with PT and OT for regaining muscle strength. Also to have his po intake monitored. Will have him on nutritional supplement. Continue thiamin, folic acid and MVI. Fall precautions  Hepatic encephalopathy Currently pt aao x3, no focal deficit. Continue his lactulose and rifaxamin for now and monitor clinically  Protein calorie malnutrition In setting of his alcohol use. Continue folic acid supplement and MVI. Continue nutritonal supplements  Seizure Alcohol related, remains seizure free. Continue his keppra at present and monitor  Anxiety Well controlled this visit. Continue his ativan  GERD Continue omeprazole and monitor clinically  Hypokalemia Continue kcl supplement, check bmp   Goals of care: to return home  Family/ staff Communication: reviewed care plan with patient and nursing supervisor   Labs/tests ordered- periodic lft, cbc

## 2013-02-23 NOTE — Progress Notes (Signed)
   February 20, 2013  Room 704, CODE STATUS full code   Patient is a 27 year old Caucasian male sent to ask in place for the purpose of strengthening and mobility training. The patient was hospitalized for an extended period of time it [facility] related to acute alcoholic encephalopathy.  He had a long and complicated hospital course.  Physical examination: Neurological:  No acute distress. He is alert verbally appropriate, oriented x3. Easily conversant. Noted with occasional hand tremor, bilaterally. Pupils are equally round and reactive to light. Unable to fully visualize the optic disc. TMs are unremarkable. Oropharynx, teeth are in excellent repair. This encounter was created in error - please disregard.

## 2013-03-08 DIAGNOSIS — K219 Gastro-esophageal reflux disease without esophagitis: Secondary | ICD-10-CM | POA: Insufficient documentation

## 2013-03-08 DIAGNOSIS — K7010 Alcoholic hepatitis without ascites: Secondary | ICD-10-CM | POA: Insufficient documentation

## 2013-03-08 DIAGNOSIS — K7290 Hepatic failure, unspecified without coma: Secondary | ICD-10-CM | POA: Insufficient documentation

## 2013-03-08 DIAGNOSIS — R531 Weakness: Secondary | ICD-10-CM | POA: Insufficient documentation

## 2013-03-08 DIAGNOSIS — F411 Generalized anxiety disorder: Secondary | ICD-10-CM | POA: Insufficient documentation

## 2013-03-13 ENCOUNTER — Telehealth: Payer: Self-pay | Admitting: Radiology

## 2013-03-13 NOTE — Telephone Encounter (Signed)
Patient states he is much better, very pleased with his progress. He is going to call when his dad is back in town, to make appt. He states he would like a Friday, so he may need to come into the urgent care, he sounds great, states he feels great.

## 2013-03-13 NOTE — Telephone Encounter (Signed)
I have called patient per Dr Cleta Alberts, he is improving s/p acute alcoholic hepatitis. Dr Cleta Alberts wants him to make appt to see him, I have left message for patient to call me back, let me know how he doing, and to make an appt.

## 2013-03-19 NOTE — Progress Notes (Unsigned)
Patient ID: Eric Mueller, male   DOB: 03-28-1986, 27 y.o.   MRN: [id number]   February 20, 2013  Room 704, CODE STATUS full code  Patient is a 27 year old Caucasian male sent to ask in place for the purpose of strengthening and mobility training. The patient was hospitalized for an extended period of time it [facility] related to acute alcoholic encephalopathy.  He had a long and complicated hospital course.  Physical examination:  Neurological: No acute distress.  He is alert verbally appropriate, oriented x3. Easily conversant. Noted with occasional hand tremor, bilaterally. Pupils are equally round and reactive to light. Unable to fully visualize the optic disc. TMs are unremarkable. Oropharynx, teeth are in excellent repair.

## 2013-03-25 ENCOUNTER — Encounter: Payer: Self-pay | Admitting: Nurse Practitioner

## 2013-03-25 ENCOUNTER — Non-Acute Institutional Stay: Payer: BC Managed Care – PPO | Admitting: Nurse Practitioner

## 2013-03-25 DIAGNOSIS — F411 Generalized anxiety disorder: Secondary | ICD-10-CM

## 2013-03-25 DIAGNOSIS — R531 Weakness: Secondary | ICD-10-CM

## 2013-03-25 DIAGNOSIS — E43 Unspecified severe protein-calorie malnutrition: Secondary | ICD-10-CM

## 2013-03-25 DIAGNOSIS — G40909 Epilepsy, unspecified, not intractable, without status epilepticus: Secondary | ICD-10-CM

## 2013-03-25 DIAGNOSIS — E876 Hypokalemia: Secondary | ICD-10-CM

## 2013-03-25 DIAGNOSIS — K219 Gastro-esophageal reflux disease without esophagitis: Secondary | ICD-10-CM

## 2013-03-25 DIAGNOSIS — K7290 Hepatic failure, unspecified without coma: Secondary | ICD-10-CM

## 2013-03-25 DIAGNOSIS — R5381 Other malaise: Secondary | ICD-10-CM

## 2013-03-25 DIAGNOSIS — K7010 Alcoholic hepatitis without ascites: Secondary | ICD-10-CM

## 2013-03-29 NOTE — Progress Notes (Signed)
Patient ID: Eric Mueller, male   DOB: May 06, 1985, 27 y.o.   MRN: [id number]  Date of visit: 03/25/2013  [facility]  CODE STATUS: Full code   Allergies  Allergen Reactions  . Oxycodone Nausea And Vomiting  . Chief Complaint  Patient presents with  . Discharge Note    HPI:  Patient is a 27 year old Caucasian male who is preparing for discharge to the [facility] for physical and occupational therapy for strength building and mobility training. The patient had been admitted after a lengthy hospital stay severe alcohol related encephalopathy, cardiomyopathy, and hepatitis.  Current Outpatient Prescriptions on File Prior to Visit  Medication Sig Dispense Refill  . diphenhydrAMINE (BENADRYL) 25 mg capsule Take 1 capsule (25 mg total) by mouth every 8 (eight) hours as needed for itching.  30 capsule  0  . folic acid (FOLVITE) 1 MG tablet Take 1 tablet (1 mg total) by mouth daily.  30 tablet  1  . levETIRAcetam (KEPPRA) 1000 MG tablet Take 1 tablet (1,000 mg total) by mouth every 12 (twelve) hours.  60 tablet  3  . Multiple Vitamin (MULTI-VITAMINS) TABS Take 1 tablet by mouth daily.      . ondansetron (ZOFRAN) 4 MG tablet Take 1 tablet (4 mg total) by mouth every 6 (six) hours as needed for nausea.  45 tablet  0  . pantoprazole (PROTONIX) 40 MG tablet Take 40 mg by mouth daily.      . potassium chloride SA (K-DUR,KLOR-CON) 20 MEQ tablet Take 1 tablet (20 mEq total) by mouth 2 (two) times daily.  60 tablet  2  . rifaximin (XIFAXAN) 550 MG TABS tablet Take 1 tablet (550 mg total) by mouth 3 (three) times daily.  90 tablet  1  . thiamine 100 MG tablet Take 1 tablet (100 mg total) by mouth daily.  30 tablet  1   No current facility-administered medications on file prior to visit.   Past Medical History  Diagnosis Date  . Seizure disorder   . Seizures   . Allergy   . Anxiety   Alcohol abuse Acute alcoholic encephalopathy Alcoholic cardiomyopathy  Past Surgical History  Procedure  Laterality Date  . Wisdom tooth extraction       Recent labs: November the 60,014 WBC 6.9 RBC 3.1  hemoglobin 10.6 Hematocrit 31.5 MCV 100.6 MCH 33.9 MCHC 33.7 RDW 12.2 Dilates 223 Total protein 6.1 Albumin 3.5 Globulin 2.6 Total bilirubin 3.5 Direct bilirubin 1.7 ALP 91 AST 48 ALT 28 03/17/2013 Folic acid greater than 25 Vitamin B12 554 03/03/2013 Iron, total, 94 BP 108/65  Pulse 86  Resp 18  SpO2 98%   Physical examination: General: Patient is alert, verbally appropriate, oriented x3, in no apparent distress. Neurological: Positive tremor with hands outstretched, apparently only intermittently present. Pupils are equally round and reactive to light, extraocular movements are intact, unable to fully visualize the optic disc. Sclera are non-icteric Oral pharynx teeth are in good repair, no grossly apparent oral lesion. No distinctly palpable cervical adenopathy. No thyromegaly. Apical pulse with a regular rate and rhythm. Bilateral breath sounds are entirely and completely clear. Abdomen is soft and on undistended No lower extremity edema  Psychiatric: No verbalization of any suicidal ideation.  Impression: 27 year old Caucasian male with history of multiple alcohol-induced medical disorders, status post skilled nursing facility care for physical therapy and rehabilitation.  Plan: Patient to be discharged to rehabilitation facility on 03/28/2013.

## 2013-03-29 NOTE — Progress Notes (Signed)
Patient ID: KYIAN OBST, male   DOB: [birth year], 27 y.o.   MRN: [id number]  This encounter was created in error - please disregard.

## 2013-06-22 ENCOUNTER — Encounter (HOSPITAL_COMMUNITY): Payer: Self-pay | Admitting: Emergency Medicine

## 2013-06-22 ENCOUNTER — Inpatient Hospital Stay (HOSPITAL_COMMUNITY)
Admission: EM | Admit: 2013-06-22 | Discharge: 2013-06-25 | DRG: 440 | Disposition: A | Discharge status: Home / Self Care | Payer: BC Managed Care – PPO | Attending: Family Medicine | Admitting: Family Medicine

## 2013-06-22 DIAGNOSIS — IMO0002 Reserved for concepts with insufficient information to code with codable children: Secondary | ICD-10-CM

## 2013-06-22 DIAGNOSIS — E43 Unspecified severe protein-calorie malnutrition: Secondary | ICD-10-CM

## 2013-06-22 DIAGNOSIS — J90 Pleural effusion, not elsewhere classified: Secondary | ICD-10-CM

## 2013-06-22 DIAGNOSIS — J9601 Acute respiratory failure with hypoxia: Secondary | ICD-10-CM

## 2013-06-22 DIAGNOSIS — F10239 Alcohol dependence with withdrawal, unspecified: Secondary | ICD-10-CM

## 2013-06-22 DIAGNOSIS — K7682 Hepatic encephalopathy: Secondary | ICD-10-CM

## 2013-06-22 DIAGNOSIS — Z79899 Other long term (current) drug therapy: Secondary | ICD-10-CM

## 2013-06-22 DIAGNOSIS — K859 Acute pancreatitis without necrosis or infection, unspecified: Principal | ICD-10-CM | POA: Diagnosis present

## 2013-06-22 DIAGNOSIS — K769 Liver disease, unspecified: Secondary | ICD-10-CM

## 2013-06-22 DIAGNOSIS — G40909 Epilepsy, unspecified, not intractable, without status epilepticus: Secondary | ICD-10-CM | POA: Diagnosis present

## 2013-06-22 DIAGNOSIS — E876 Hypokalemia: Secondary | ICD-10-CM

## 2013-06-22 DIAGNOSIS — J810 Acute pulmonary edema: Secondary | ICD-10-CM

## 2013-06-22 DIAGNOSIS — R531 Weakness: Secondary | ICD-10-CM

## 2013-06-22 DIAGNOSIS — K140 Glossitis: Secondary | ICD-10-CM

## 2013-06-22 DIAGNOSIS — K7290 Hepatic failure, unspecified without coma: Secondary | ICD-10-CM

## 2013-06-22 DIAGNOSIS — D689 Coagulation defect, unspecified: Secondary | ICD-10-CM

## 2013-06-22 DIAGNOSIS — F1010 Alcohol abuse, uncomplicated: Secondary | ICD-10-CM

## 2013-06-22 DIAGNOSIS — I426 Alcoholic cardiomyopathy: Secondary | ICD-10-CM

## 2013-06-22 DIAGNOSIS — K7010 Alcoholic hepatitis without ascites: Secondary | ICD-10-CM

## 2013-06-22 DIAGNOSIS — D72829 Elevated white blood cell count, unspecified: Secondary | ICD-10-CM

## 2013-06-22 DIAGNOSIS — F10939 Alcohol use, unspecified with withdrawal, unspecified: Secondary | ICD-10-CM

## 2013-06-22 DIAGNOSIS — F411 Generalized anxiety disorder: Secondary | ICD-10-CM

## 2013-06-22 DIAGNOSIS — K219 Gastro-esophageal reflux disease without esophagitis: Secondary | ICD-10-CM

## 2013-06-22 DIAGNOSIS — K759 Inflammatory liver disease, unspecified: Secondary | ICD-10-CM

## 2013-06-22 DIAGNOSIS — R740 Nonspecific elevation of levels of transaminase and lactic acid dehydrogenase [LDH]: Secondary | ICD-10-CM

## 2013-06-22 DIAGNOSIS — R7401 Elevation of levels of liver transaminase levels: Secondary | ICD-10-CM

## 2013-06-22 DIAGNOSIS — F1021 Alcohol dependence, in remission: Secondary | ICD-10-CM | POA: Diagnosis present

## 2013-06-22 DIAGNOSIS — Z885 Allergy status to narcotic agent status: Secondary | ICD-10-CM

## 2013-06-22 DIAGNOSIS — D696 Thrombocytopenia, unspecified: Secondary | ICD-10-CM

## 2013-06-22 HISTORY — DX: Inflammatory liver disease, unspecified: K75.9

## 2013-06-22 LAB — COMPREHENSIVE METABOLIC PANEL
ALBUMIN: 4.9 g/dL (ref 3.5–5.2)
ALT: 106 U/L — ABNORMAL HIGH (ref 0–53)
AST: 140 U/L — AB (ref 0–37)
Alkaline Phosphatase: 105 U/L (ref 39–117)
BUN: 18 mg/dL (ref 6–23)
CO2: 25 mEq/L (ref 19–32)
CREATININE: 0.84 mg/dL (ref 0.50–1.35)
Calcium: 10.1 mg/dL (ref 8.4–10.5)
Chloride: 95 mEq/L — ABNORMAL LOW (ref 96–112)
GFR calc Af Amer: >90 mL/min (ref >90)
GFR calc non Af Amer: >90 mL/min (ref >90)
Glucose, Bld: 132 mg/dL — ABNORMAL HIGH (ref 70–99)
Potassium: 4.1 mEq/L (ref 3.7–5.3)
Sodium: 138 mEq/L (ref 137–147)
TOTAL PROTEIN: 8.3 g/dL (ref 6.0–8.3)
Total Bilirubin: 2.9 mg/dL — ABNORMAL HIGH (ref 0.3–1.2)

## 2013-06-22 LAB — CBC WITH DIFFERENTIAL/PLATELET
BASOS PCT: 0 % (ref 0–1)
Basophils Absolute: 0 10*3/uL (ref 0.0–0.1)
EOS ABS: 0.1 10*3/uL (ref 0.0–0.7)
Eosinophils Relative: 1 % (ref 0–5)
HEMATOCRIT: 42.3 % (ref 39.0–52.0)
Hemoglobin: 15.4 g/dL (ref 13.0–17.0)
Lymphocytes Relative: 10 % — ABNORMAL LOW (ref 12–46)
Lymphs Abs: 1.8 10*3/uL (ref 0.7–4.0)
MCH: 32.2 pg (ref 26.0–34.0)
MCHC: 36.4 g/dL — AB (ref 30.0–36.0)
MCV: 88.3 fL (ref 78.0–100.0)
MONO ABS: 1.9 10*3/uL — AB (ref 0.1–1.0)
Monocytes Relative: 11 % (ref 3–12)
NEUTROS ABS: 14 10*3/uL — AB (ref 1.7–7.7)
Neutrophils Relative %: 79 % — ABNORMAL HIGH (ref 43–77)
Platelets: 229 10*3/uL (ref 150–400)
RBC: 4.79 MIL/uL (ref 4.22–5.81)
RDW: 12.5 % (ref 11.5–15.5)
WBC: 17.8 10*3/uL — ABNORMAL HIGH (ref 4.0–10.5)

## 2013-06-22 LAB — LIPASE, BLOOD: LIPASE: 463 U/L — AB (ref 11–59)

## 2013-06-22 NOTE — ED Notes (Signed)
Pt presents with c/o vomiting and abdominal pain that started at 5 am this morning. Pt has a hx of pancreatitis, last seen for that January of last year. Pt reports he has vomited about 3-4 times today, mostly bile. Hx of seizures as well.

## 2013-06-23 ENCOUNTER — Encounter (HOSPITAL_COMMUNITY): Payer: Self-pay

## 2013-06-23 ENCOUNTER — Inpatient Hospital Stay (HOSPITAL_COMMUNITY): Payer: BC Managed Care – PPO

## 2013-06-23 DIAGNOSIS — K859 Acute pancreatitis without necrosis or infection, unspecified: Secondary | ICD-10-CM | POA: Diagnosis present

## 2013-06-23 DIAGNOSIS — G40909 Epilepsy, unspecified, not intractable, without status epilepticus: Secondary | ICD-10-CM

## 2013-06-23 LAB — URINALYSIS, ROUTINE W REFLEX MICROSCOPIC
GLUCOSE, UA: NEGATIVE mg/dL
Hgb urine dipstick: NEGATIVE
Ketones, ur: NEGATIVE mg/dL
LEUKOCYTES UA: NEGATIVE
NITRITE: NEGATIVE
PH: 5.5 (ref 5.0–8.0)
Protein, ur: 30 mg/dL — AB
SPECIFIC GRAVITY, URINE: 1.039 — AB (ref 1.005–1.030)
Urobilinogen, UA: 0.2 mg/dL (ref 0.0–1.0)

## 2013-06-23 LAB — URINE MICROSCOPIC-ADD ON

## 2013-06-23 MED ORDER — SODIUM CHLORIDE 0.9 % IV SOLN
INTRAVENOUS | Status: DC
  Administered 2013-06-23: 03:00:00 via INTRAVENOUS

## 2013-06-23 MED ORDER — PANTOPRAZOLE SODIUM 40 MG IV SOLR
40.0000 mg | Freq: Every day | INTRAVENOUS | Status: DC
  Administered 2013-06-23 – 2013-06-24 (×2): 40 mg via INTRAVENOUS
  Filled 2013-06-23 (×3): qty 40

## 2013-06-23 MED ORDER — LEVETIRACETAM 500 MG/5ML IV SOLN
1000.0000 mg | Freq: Two times a day (BID) | INTRAVENOUS | Status: DC
  Administered 2013-06-23 – 2013-06-25 (×5): 1000 mg via INTRAVENOUS
  Filled 2013-06-23 (×6): qty 10

## 2013-06-23 MED ORDER — ZOLPIDEM TARTRATE 5 MG PO TABS
5.0000 mg | ORAL_TABLET | Freq: Once | ORAL | Status: AC
  Administered 2013-06-23: 5 mg via ORAL
  Filled 2013-06-23: qty 1

## 2013-06-23 MED ORDER — IOHEXOL 300 MG/ML  SOLN
100.0000 mL | Freq: Once | INTRAMUSCULAR | Status: AC | PRN
  Administered 2013-06-23: 100 mL via INTRAVENOUS

## 2013-06-23 MED ORDER — SODIUM CHLORIDE 0.9 % IV SOLN
INTRAVENOUS | Status: DC
  Administered 2013-06-23: 09:00:00 via INTRAVENOUS
  Administered 2013-06-24: 1000 mL via INTRAVENOUS
  Administered 2013-06-24 – 2013-06-25 (×4): via INTRAVENOUS

## 2013-06-23 MED ORDER — MORPHINE SULFATE 2 MG/ML IJ SOLN
2.0000 mg | INTRAMUSCULAR | Status: DC | PRN
  Administered 2013-06-23: 2 mg via INTRAVENOUS
  Filled 2013-06-23: qty 1

## 2013-06-23 MED ORDER — ONDANSETRON HCL 4 MG PO TABS: 4.0000 mg | ORAL_TABLET | Freq: Four times a day (QID) | ORAL | Status: DC | PRN

## 2013-06-23 MED ORDER — ONDANSETRON HCL 4 MG/2ML IJ SOLN: 4.0000 mg | Freq: Four times a day (QID) | INTRAMUSCULAR | Status: DC | PRN

## 2013-06-23 MED ORDER — ONDANSETRON HCL 4 MG/2ML IJ SOLN
4.0000 mg | Freq: Once | INTRAMUSCULAR | Status: AC
  Administered 2013-06-23: 4 mg via INTRAVENOUS
  Filled 2013-06-23: qty 2

## 2013-06-23 MED ORDER — FENTANYL CITRATE 0.05 MG/ML IJ SOLN
50.0000 ug | INTRAMUSCULAR | Status: DC | PRN
  Administered 2013-06-23 (×2): 50 ug via INTRAVENOUS
  Filled 2013-06-23 (×2): qty 2

## 2013-06-23 MED ORDER — SODIUM CHLORIDE 0.9 % IV SOLN
1000.0000 mL | Freq: Once | INTRAVENOUS | Status: AC
  Administered 2013-06-23: 1000 mL via INTRAVENOUS

## 2013-06-23 MED ORDER — IOHEXOL 300 MG/ML  SOLN
50.0000 mL | Freq: Once | INTRAMUSCULAR | Status: AC | PRN
  Administered 2013-06-23: 50 mL via ORAL

## 2013-06-23 NOTE — H&P (Signed)
PCP:  Lucilla EdinAUB, STEVE A, MD    Chief Complaint:  Abdominal pain  HPI: Eric Mueller is a 28 y.o. male   has a past medical history of Seizure disorder; Seizures; Allergy; Anxiety; and Hepatitis.   Presented with  Patient woke up with nausea, vomiting, abdominal pain. Patient continued to feel poor and presetned to ER.  CT scan done showing acute pancreatitis. Patient has hx f EtOH abuse in recovery last drink was September 14. 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 Reports eating "Junk food"all night prior to this episode.  [name] reports doing well from the stand point of alcoholism.   Review of Systems:    Pertinent positives include:  abdominal pain, nausea, vomiting, low grade fever  Constitutional:  No weight loss, night sweats, Fevers, chills, fatigue, weight loss  HEENT:  No headaches, Difficulty swallowing,Tooth/dental problems,Sore throat,  No sneezing, itching, ear ache, nasal congestion, post nasal drip,  Cardio-vascular:  No chest pain, Orthopnea, PND, anasarca, dizziness, palpitations.no Bilateral lower extremity swelling  GI:  No heartburn, indigestion, diarrhea, change in bowel habits, loss of appetite, melena, blood in stool, hematemesis Resp:  no shortness of breath at rest. No dyspnea on exertion, No excess mucus, no productive cough, No non-productive cough, No coughing up of blood.No change in color of mucus.No wheezing. Skin:  no rash or lesions. No jaundice GU:  no dysuria, change in color of urine, no urgency or frequency. No straining to urinate.  No flank pain.  Musculoskeletal:  No joint pain or no joint swelling. No decreased range of motion. No back pain.  Psych:  No change in mood or affect. No depression or anxiety. No memory loss.  Neuro: no localizing neurological complaints, no tingling, no weakness, no double vision, no gait abnormality, no slurred speech, no confusion  Otherwise ROS are negative except for above, 10 systems  were reviewed  Past Medical History: Past Medical History  Diagnosis Date  . Seizure disorder   . Seizures   . Allergy   . Anxiety   . Hepatitis    Past Surgical History  Procedure Laterality Date  . Wisdom tooth extraction       Medications: Prior to Admission medications   Medication Sig Start Date End Date Taking? Authorizing Provider  folic acid (FOLVITE) 1 MG tablet Take 1 tablet (1 mg total) by mouth daily. 02/02/13  Yes Dorothea OgleIskra M Myers, MD  levETIRAcetam (KEPPRA) 1000 MG tablet Take 1 tablet (1,000 mg total) by mouth every 12 (twelve) hours. 11/28/12  Yes Maryruth Bunhristina P Rama, MD  Multiple Vitamin (MULTI-VITAMINS) TABS Take 1 tablet by mouth daily.   Yes Historical Provider, MD  potassium chloride SA (K-DUR,KLOR-CON) 20 MEQ tablet Take 1 tablet (20 mEq total) by mouth 2 (two) times daily. 11/28/12  Yes Maryruth Bunhristina P Rama, MD    Allergies:   Allergies  Allergen Reactions  . Oxycodone Nausea And Vomiting    Social History:  Ambulatory  Independently  [facility] [address]   reports that he has never smoked. He has never used smokeless tobacco. He reports that he does not drink alcohol or use illicit drugs.   Family History: family history includes Cancer in an other family member; Heart disease in an other family member; Hypertension in his father.    Physical Exam: Patient Vitals for the past 24 hrs:  BP Temp Temp src Pulse Resp SpO2 Height Weight  06/22/13 2300 144/95 mmHg 99 F (37.2 C) Oral 89  20 99 % - -  06/22/13 1925 148/96 mmHg 99.6 F (37.6 C) Oral 99 20 99 % 5\' 10"  (1.778 m) 74.844 kg (165 lb)    1. General:  in No Acute distress 2. Psychological: Alert and  Oriented 3. Head/ENT:    Dry Mucous Membranes                          Head Non traumatic, neck supple                          Normal   Dentition 4. SKIN:  decreased Skin turgor,  Skin clean Dry and intact no rash 5. Heart: Regular rate and rhythm no Murmur, Rub or gallop 6. Lungs:  Clear to auscultation bilaterally, no wheezes or crackles   7. Abdomen: Soft, epigastric tenderness, Non distended 8. Lower extremities: no clubbing, cyanosis, or edema 9. Neurologically Grossly intact, moving all 4 extremities equally 10. MSK: Normal range of motion  body mass index is 23.68 kg/(m^2).   Labs on Admission:   Recent Labs  06/22/13 2150  NA 138  K 4.1  CL 95*  CO2 25  GLUCOSE 132*  BUN 18  CREATININE 0.84  CALCIUM 10.1    Recent Labs  06/22/13 2150  AST 140*  ALT 106*  ALKPHOS 105  BILITOT 2.9*  PROT 8.3  ALBUMIN 4.9    Recent Labs  06/22/13 2150  LIPASE 463*    Recent Labs  06/22/13 2150  WBC 17.8*  NEUTROABS 14.0*  HGB 15.4  HCT 42.3  MCV 88.3  PLT 229   No results found for this basename: CKTOTAL, CKMB, CKMBINDEX, TROPONINI,  in the last 72 hours No results found for this basename: TSH, T4TOTAL, FREET3, T3FREE, THYROIDAB,  in the last 72 hours No results found for this basename: VITAMINB12, FOLATE, FERRITIN, TIBC, IRON, RETICCTPCT,  in the last 72 hours Lab Results  Component Value Date   HGBA1C 5.2 05/11/2012    Estimated Creatinine Clearance: 134 ml/min (by C-G formula based on Cr of 0.84). ABG    Component Value Date/Time   TCO2 22 11/26/2012 2250     No results found for this basename: DDIMER     Other results:  UA no evidence of infection Lipase 463  Cultures:    Component Value Date/Time   SDES PLEURAL 01/17/2013 1250   SPECREQUEST Normal 01/17/2013 1250   CULT  Value: NO GROWTH 3 DAYS Performed at [facility] 01/17/2013 1250   REPTSTATUS 01/21/2013 FINAL 01/17/2013 1250       Radiological Exams on Admission: Ct Abdomen Pelvis W Contrast  06/23/2013   CLINICAL DATA:  Abdominal pain, nausea and vomiting, leukocytosis. History of hepatitis and pancreatitis .  EXAM: CT ABDOMEN AND PELVIS WITH CONTRAST  TECHNIQUE: Multidetector CT imaging of the abdomen and pelvis was performed using the standard protocol  following bolus administration of intravenous contrast.  CONTRAST:  OMNIPAQUE IOHEXOL 300 MG/ML  SOLN  COMPARISON:  US ABDOMEN LIMITED dated 01/16/2013; CT ABD/PELVIS W CM dated 05/09/2012  FINDINGS: Included view of the lung bases demonstrate dependent atelectasis. Visualized heart and pericardium are unremarkable.  Diffuse peripancreatic inflammatory changes with trace free fluid, no abscess, no pancreatic necrosis or pseudocyst. No pancreatic calcifications. Small amount of perihepatic ascites and ascites in the pelvis.  The liver demonstrates normal density on today's examination, improved, though there is a large, 21 cm in craniocaudad dimension.  The, spleen, gallbladder, and adrenal glands are unremarkable.  The stomach, small and large bowel are normal in course and caliber ; mild inflammatory changes about the duodenum are likely reactive. No intraperitoneal free air.  Kidneys are orthotopic, demonstrating symmetric enhancement without nephrolithiasis, hydronephrosis or renal masses. The unopacified ureters are normal in course and caliber. Delayed imaging through the kidneys demonstrates symmetric prompt excretion to the proximal urinary collecting system. Urinary bladder is partially distended and unremarkable.  Great vessels are normal in course and caliber. No lymphadenopathy by CT size criteria. Internal reproductive organs are unremarkable. The soft tissues and included osseous structures are nonsuspicious.  IMPRESSION: Acute pancreatitis, without CT findings of pancreatic necrosis or pseudocyst cyst. Small amount of peripancreatic free fluid, with small amount of perihepatic ascites and free fluid in the pelvis.  Improved appearance the liver, no CT findings of steatosis on today's examination though there is persistent hepatomegaly.   Electronically Signed   By: Awilda Metro   On: 06/23/2013 03:55    Chart has been reviewed  Assessment/Plan  28 year old male with history of alcoholism  now in remission. Presents with acute pancreatitis  Present on Admission:  . Pancreatitis, acute - admit for IV fluid rehydration, monitor electrolytes repeat potassium as needed watch for Disbalance obtain lipid panel in a.m.  . Seizure disorder - change Keppra to IV as patient is currently n.p.o.      Prophylaxis: SCD , Protonix  CODE STATUS: FULL CODE  Other plan as per orders.  I have spent a total of 55 min on this admission  Raudel Bazen 06/23/2013, 4:36 AM

## 2013-06-23 NOTE — Care Management Note (Signed)
    Page 1 of 1   06/23/2013     11:33:40 AM   CARE MANAGEMENT NOTE 06/23/2013  Patient:  Eric Mueller,[name]   Account Number:  [id number]  Date Initiated:  06/23/2013  Documentation initiated by:  Lorenda IshiharaPEELE,Treanna Dumler  Subjective/Objective Assessment:   28 yo male admitted with pancreatitis. PTA lived at [facility].     Action/Plan:   Home when stable   Anticipated DC Date:  06/26/2013   Anticipated DC Plan:  GROUP HOME      DC Planning Services  CM consult      Choice offered to / List presented to:             Status of service:  Completed, signed off Medicare Important Message given?   (If response is "NO", the following Medicare IM given date fields will be blank) Date Medicare IM given:   Date Additional Medicare IM given:    Discharge Disposition:  GROUP HOME  Per UR Regulation:  Reviewed for med. necessity/level of care/duration of stay  If discussed at Long Length of Stay Meetings, dates discussed:    Comments:

## 2013-06-23 NOTE — Progress Notes (Signed)
Pt would like med to help him sleep this pm. Called midlevel awaiting call back.

## 2013-06-23 NOTE — ED Provider Notes (Signed)
CSN: [id number]     Arrival date & time 06/22/13  1751 History   First MD Initiated Contact with Patient 06/23/13 [phone]     Chief Complaint  Patient presents with  . Abdominal Pain  . Emesis     (Consider location/radiation/quality/duration/timing/severity/associated sxs/prior Treatment) HPI History provided by patient.  Epigastric and left upper quadrant abdominal pain with associated nausea vomiting onset tonight. Pain sharp in quality and not radiating. Feels like previous episode of pancreatitis. No diarrhea. No fevers or chills. No blood in emesis. History of alcoholism, denies any alcohol use since September 2014. No new medications. Ate a large greasy meal for dinner prior to onset of symptoms. No previous surgeries. No known history of gallbladder disease. Past Medical History  Diagnosis Date  . Seizure disorder   . Seizures   . Allergy   . Anxiety   . Hepatitis    Past Surgical History  Procedure Laterality Date  . Wisdom tooth extraction     Family History  Problem Relation Age of Onset  . Heart disease      cardiovascular disease  . Cancer     History  Substance Use Topics  . Smoking status: Never Smoker   . Smokeless tobacco: Never Used  . Alcohol Use: No     Comment: no EtOH since 01/13/2013    Review of Systems  Constitutional: Negative for fever and chills.  Respiratory: Negative for shortness of breath.   Cardiovascular: Negative for chest pain.  Gastrointestinal: Positive for vomiting and abdominal pain. Negative for blood in stool.  Genitourinary: Negative for flank pain.  Musculoskeletal: Negative for back pain, neck pain and neck stiffness.  Skin: Negative for rash.  Neurological: Negative for headaches.  All other systems reviewed and are negative.      Allergies  Oxycodone  Home Medications   Current Outpatient Rx  Name  Route  Sig  Dispense  Refill  . folic acid (FOLVITE) 1 MG tablet   Oral   Take 1 tablet (1 mg total) by mouth  daily.   30 tablet   1   . levETIRAcetam (KEPPRA) 1000 MG tablet   Oral   Take 1 tablet (1,000 mg total) by mouth every 12 (twelve) hours.   60 tablet   3   . Multiple Vitamin (MULTI-VITAMINS) TABS   Oral   Take 1 tablet by mouth daily.         . potassium chloride SA (K-DUR,KLOR-CON) 20 MEQ tablet   Oral   Take 1 tablet (20 mEq total) by mouth 2 (two) times daily.   60 tablet   2    BP 144/95  Pulse 89  Temp(Src) 99 F (37.2 C) (Oral)  Resp 20  Ht 5\' 10"  (1.778 m)  Wt 165 lb (74.844 kg)  BMI 23.68 kg/m2  SpO2 99% Physical Exam  Constitutional: He is oriented to person, place, and time. He appears well-developed and well-nourished.  HENT:  Head: Normocephalic and atraumatic.  Eyes: EOM are normal. Pupils are equal, round, and reactive to light.  Neck: Neck supple.  Cardiovascular: Regular rhythm and intact distal pulses.   Tachycardic  Pulmonary/Chest: Effort normal and breath sounds normal. No respiratory distress. He exhibits no tenderness.  Abdominal: Soft. Bowel sounds are normal. He exhibits no distension and no mass. There is tenderness. There is no rebound and no guarding.  Tender to palpation epigastric and left upper quadrant. No right upper quadrant tenderness and negative Murphy's sign.  Musculoskeletal: Normal range of  motion. He exhibits no edema.  Neurological: He is alert and oriented to person, place, and time.  Skin: Skin is warm and dry.    ED Course  Procedures (including critical care time) Labs Review Labs Reviewed  CBC WITH DIFFERENTIAL - Abnormal; Notable for the following:    WBC 17.8 (*)    MCHC 36.4 (*)    Neutrophils Relative % 79 (*)    Neutro Abs 14.0 (*)    Lymphocytes Relative 10 (*)    Monocytes Absolute 1.9 (*)    All other components within normal limits  COMPREHENSIVE METABOLIC PANEL - Abnormal; Notable for the following:    Chloride 95 (*)    Glucose, Bld 132 (*)    AST 140 (*)    ALT 106 (*)    Total Bilirubin 2.9  (*)    All other components within normal limits  LIPASE, BLOOD - Abnormal; Notable for the following:    Lipase 463 (*)    All other components within normal limits  URINALYSIS, ROUTINE W REFLEX MICROSCOPIC - Abnormal; Notable for the following:    Color, Urine AMBER (*)    Specific Gravity, Urine 1.039 (*)    Bilirubin Urine SMALL (*)    Protein, ur 30 (*)    All other components within normal limits  URINE MICROSCOPIC-ADD ON - Abnormal; Notable for the following:    Bacteria, UA FEW (*)    All other components within normal limits   Imaging Review Ct Abdomen Pelvis W Contrast  06/23/2013   CLINICAL DATA:  Abdominal pain, nausea and vomiting, leukocytosis. History of hepatitis and pancreatitis .  EXAM: CT ABDOMEN AND PELVIS WITH CONTRAST  TECHNIQUE: Multidetector CT imaging of the abdomen and pelvis was performed using the standard protocol following bolus administration of intravenous contrast.  CONTRAST:  OMNIPAQUE IOHEXOL 300 MG/ML  SOLN  COMPARISON:  US ABDOMEN LIMITED dated 01/16/2013; CT ABD/PELVIS W CM dated 05/09/2012  FINDINGS: Included view of the lung bases demonstrate dependent atelectasis. Visualized heart and pericardium are unremarkable.  Diffuse peripancreatic inflammatory changes with trace free fluid, no abscess, no pancreatic necrosis or pseudocyst. No pancreatic calcifications. Small amount of perihepatic ascites and ascites in the pelvis.  The liver demonstrates normal density on today's examination, improved, though there is a large, 21 cm in craniocaudad dimension. The, spleen, gallbladder, and adrenal glands are unremarkable.  The stomach, small and large bowel are normal in course and caliber ; mild inflammatory changes about the duodenum are likely reactive. No intraperitoneal free air.  Kidneys are orthotopic, demonstrating symmetric enhancement without nephrolithiasis, hydronephrosis or renal masses. The unopacified ureters are normal in course and caliber. Delayed  imaging through the kidneys demonstrates symmetric prompt excretion to the proximal urinary collecting system. Urinary bladder is partially distended and unremarkable.  Great vessels are normal in course and caliber. No lymphadenopathy by CT size criteria. Internal reproductive organs are unremarkable. The soft tissues and included osseous structures are nonsuspicious.  IMPRESSION: Acute pancreatitis, without CT findings of pancreatic necrosis or pseudocyst cyst. Small amount of peripancreatic free fluid, with small amount of perihepatic ascites and free fluid in the pelvis.  Improved appearance the liver, no CT findings of steatosis on today's examination though there is persistent hepatomegaly.   Electronically Signed   By: Awilda Metro   On: 06/23/2013 03:55    EKG Interpretation   None      IV fluids. IV fentanyl. IV Zofran.  MED consult for admission - Dr Adela Glimpse evaluated  bedside MDM   Diagnosis: Pancreatitis  Treated with IV fluids and IV narcotics. Evaluated with labs and CT scan reviewed as above. Patient kept n.p.o. MED admit  Sunnie NielsenBrian Alyne Martinson, MD 06/23/13 [phone]

## 2013-06-23 NOTE — Progress Notes (Signed)
Care of pt assumed at this time.  Agree with previously charted assessments.  Pt denies concerns or needs at this time.  Will monitor.  Ardyth GalAnderson, Noura Purpura Ann, RN 06/23/2013

## 2013-06-23 NOTE — Progress Notes (Signed)
12:48 PM I agree with HPI/GPe and A/P per Dr. 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 Ottie Glazierormac O'donavan   admitted with acute abdominal pain   secondary to pancreatitis  Doing better now  Pain almost nonexistent with IV meds wants to try clears No nausea no vomiting no chest pain States has not had an alcoholic drink since September 2014 Not on any nonsteroidal medications,  Currently has a new job  HEENT EOMI, NCAT  CHEST clear  CARDIAC S1-S2 no murmur rub or gallop  ABDOMEN soft slightly tender epigastrium  NEURO grossly intact    Patient Active Problem List   Diagnosis Date Noted  . Pancreatitis, acute 06/23/2013  . Pancreatitis 06/23/2013  . Alcoholic hepatitis 03/08/2013  . Weakness generalized 03/08/2013  . Generalized anxiety disorder 03/08/2013  . GERD (gastroesophageal reflux disease) 03/08/2013  . Encephalopathy, hepatic 03/08/2013  . Protein-calorie malnutrition, severe 01/26/2013  . Acute liver disease 01/25/2013  . Pleural effusion, bilateral 01/17/2013  . Acute pulmonary edema 01/16/2013  . 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 Acute respiratory failure with hypoxia 01/16/2013  . Coagulopathy 01/13/2013  . Hepatitis 01/13/2013  . Hypomagnesemia 11/28/2012  . Ulceration, tongue traumatic 11/28/2012  . Disorder of magnesium metabolism 11/28/2012  . Glossitis 11/28/2012  . Transaminitis 11/27/2012  . Leukocytosis, unspecified 11/27/2012  . Hypokalemia 11/27/2012  . Thrombocytopenia 05/11/2012  . Alcohol abuse 05/10/2012  . Alcohol withdrawal 05/10/2012  . Acute pancreatitis 05/09/2012  . Seizure disorder     acute pancreatitis-patient actually looks pretty good right now. Will continue plan as per history of present illness and hope patient can  tolerate clear liquids and potentially gradually diet in the morning. Rest of plan as per history of present illness   Eric KochJai Rick Warnick, MD [facility] [phone](P) [phone]

## 2013-06-24 LAB — COMPREHENSIVE METABOLIC PANEL
ALBUMIN: 3.4 g/dL — AB (ref 3.5–5.2)
ALK PHOS: 73 U/L (ref 39–117)
ALT: 44 U/L (ref 0–53)
AST: 40 U/L — ABNORMAL HIGH (ref 0–37)
BUN: 13 mg/dL (ref 6–23)
CALCIUM: 8.7 mg/dL (ref 8.4–10.5)
CO2: 23 mEq/L (ref 19–32)
Chloride: 102 mEq/L (ref 96–112)
Creatinine, Ser: 0.84 mg/dL (ref 0.50–1.35)
GFR calc Af Amer: >90 mL/min (ref >90)
GFR calc non Af Amer: >90 mL/min (ref >90)
GLUCOSE: 83 mg/dL (ref 70–99)
Potassium: 3.9 mEq/L (ref 3.7–5.3)
SODIUM: 138 meq/L (ref 137–147)
Total Bilirubin: 2.4 mg/dL — ABNORMAL HIGH (ref 0.3–1.2)
Total Protein: 6.1 g/dL (ref 6.0–8.3)

## 2013-06-24 LAB — CBC
HEMATOCRIT: 32.5 % — AB (ref 39.0–52.0)
HEMOGLOBIN: 11.9 g/dL — AB (ref 13.0–17.0)
MCH: 32.5 pg (ref 26.0–34.0)
MCHC: 36.6 g/dL — ABNORMAL HIGH (ref 30.0–36.0)
MCV: 88.8 fL (ref 78.0–100.0)
Platelets: 124 10*3/uL — ABNORMAL LOW (ref 150–400)
RBC: 3.66 MIL/uL — ABNORMAL LOW (ref 4.22–5.81)
RDW: 12.2 % (ref 11.5–15.5)
WBC: 5.6 10*3/uL (ref 4.0–10.5)

## 2013-06-24 LAB — LIPID PANEL
Cholesterol: 143 mg/dL (ref 0–200)
HDL: 48 mg/dL (ref >39)
LDL CALC: 71 mg/dL (ref 0–99)
Total CHOL/HDL Ratio: 3 RATIO
Triglycerides: 120 mg/dL (ref <150)
VLDL: 24 mg/dL (ref 0–40)

## 2013-06-24 LAB — TSH: TSH: 2.055 u[IU]/mL (ref 0.350–4.500)

## 2013-06-24 LAB — MAGNESIUM: MAGNESIUM: 1.6 mg/dL (ref 1.5–2.5)

## 2013-06-24 LAB — PHOSPHORUS: PHOSPHORUS: 3.5 mg/dL (ref 2.3–4.6)

## 2013-06-24 LAB — LIPASE, BLOOD: LIPASE: 57 U/L (ref 11–59)

## 2013-06-24 NOTE — Progress Notes (Signed)
[facility] PROGRESS NOTE  Eric Mueller:[id number] DOB: 19-Nov-1985 DOA: 06/22/2013 PCP: Lucilla Edin, MD  Assessment/Plan: 1. Acute Pancreatitis- improving, will check lipase today and start him on soft diet. If tolerates the diet he may go home in am. Lipid profile shows TG 120, Total cholesterol 143. 2. Seizure disorder- continue keppra 1000 mg po BID   Code Status: Full code Family Communication: *No family at bedside Disposition Plan: Home when stable*   Consultants:  None  Procedures:  None  Antibiotics:  *None  HPI/Subjective: Patient seen and examined, wants to eat. Lipase not done this am.  Objective: Filed Vitals:   06/24/13 0615  BP: 96/49  Pulse: 73  Temp: 98.2 F (36.8 C)  Resp: 16    Intake/Output Summary (Last 24 hours) at 06/24/13 1323 Last data filed at 06/24/13 1115  Gross per 24 hour  Intake   4385 ml  Output   1900 ml  Net   2485 ml   Filed Weights   06/22/13 1925 06/23/13 1000  Weight: 74.844 kg (165 lb) 77.338 kg (170 lb 8 oz)    Exam:  Physical Exam: Head: Normocephalic, atraumatic.  Eyes: No signs of jaundice, EOMI Nose: Mucous membranes dry.  Throat: Oropharynx nonerythematous, no exudate appreciated.  Neck: supple,No deformities, masses, or tenderness noted. Lungs: Normal respiratory effort. B/L Clear to auscultation, no crackles or wheezes.  Heart: Regular RR. S1 and S2 normal  Abdomen: BS normoactive. Soft, Nondistended, non-tender.  Extremities: No pretibial edema, no erythema   Data Reviewed: Basic Metabolic Panel:  Recent Labs Lab 06/22/13 2150 06/24/13 0340  NA 138 138  K 4.1 3.9  CL 95* 102  CO2 25 23  GLUCOSE 132* 83  BUN 18 13  CREATININE 0.84 0.84  CALCIUM 10.1 8.7  MG  --  1.6  PHOS  --  3.5   Liver Function Tests:  Recent Labs Lab 06/22/13 2150 06/24/13 0340  AST 140* 40*  ALT 106* 44  ALKPHOS 105 73  BILITOT 2.9* 2.4*  PROT 8.3 6.1  ALBUMIN 4.9 3.4*    Recent Labs Lab  06/22/13 2150  LIPASE 463*   No results found for this basename: AMMONIA,  in the last 168 hours CBC:  Recent Labs Lab 06/22/13 2150 06/24/13 0340  WBC 17.8* 5.6  NEUTROABS 14.0*  --   HGB 15.4 11.9*  HCT 42.3 32.5*  MCV 88.3 88.8  PLT 229 124*   Cardiac Enzymes: No results found for this basename: CKTOTAL, CKMB, CKMBINDEX, TROPONINI,  in the last 168 hours BNP (last 3 results)  Recent Labs  01/16/13 1230 01/19/13 0350  PROBNP 282.4* 41.7   CBG: No results found for this basename: GLUCAP,  in the last 168 hours  No results found for this or any previous visit (from the past 240 hour(s)).   Studies: Ct Abdomen Pelvis W Contrast  06/23/2013   CLINICAL DATA:  Abdominal pain, nausea and vomiting, leukocytosis. History of hepatitis and pancreatitis .  EXAM: CT ABDOMEN AND PELVIS WITH CONTRAST  TECHNIQUE: Multidetector CT imaging of the abdomen and pelvis was performed using the standard protocol following bolus administration of intravenous contrast.  CONTRAST:  OMNIPAQUE IOHEXOL 300 MG/ML  SOLN  COMPARISON:  US ABDOMEN LIMITED dated 01/16/2013; CT ABD/PELVIS W CM dated 05/09/2012  FINDINGS: Included view of the lung bases demonstrate dependent atelectasis. Visualized heart and pericardium are unremarkable.  Diffuse peripancreatic inflammatory changes with trace free fluid, no abscess, no pancreatic necrosis or pseudocyst. No  pancreatic calcifications. Small amount of perihepatic ascites and ascites in the pelvis.  The liver demonstrates normal density on today's examination, improved, though there is a large, 21 cm in craniocaudad dimension. The, spleen, gallbladder, and adrenal glands are unremarkable.  The stomach, small and large bowel are normal in course and caliber ; mild inflammatory changes about the duodenum are likely reactive. No intraperitoneal free air.  Kidneys are orthotopic, demonstrating symmetric enhancement without nephrolithiasis, hydronephrosis or renal masses.  The unopacified ureters are normal in course and caliber. Delayed imaging through the kidneys demonstrates symmetric prompt excretion to the proximal urinary collecting system. Urinary bladder is partially distended and unremarkable.  Great vessels are normal in course and caliber. No lymphadenopathy by CT size criteria. Internal reproductive organs are unremarkable. The soft tissues and included osseous structures are nonsuspicious.  IMPRESSION: Acute pancreatitis, without CT findings of pancreatic necrosis or pseudocyst cyst. Small amount of peripancreatic free fluid, with small amount of perihepatic ascites and free fluid in the pelvis.  Improved appearance the liver, no CT findings of steatosis on today's examination though there is persistent hepatomegaly.   Electronically Signed   By: Awilda Metroourtnay  Bloomer   On: 06/23/2013 03:55    Scheduled Meds: . levETIRAcetam  1,000 mg Intravenous Q12H  . pantoprazole (PROTONIX) IV  40 mg Intravenous QHS   Continuous Infusions: . sodium chloride 150 mL/hr at 06/24/13 0456    Active Problems:   Seizure disorder   Pancreatitis, acute   Pancreatitis    Time spent: 25 min    [facility],Renisha Cockrum S  [facility] Pager [phone]. If 7PM-7AM, please contact night-coverage at [email or web address], password [email or web address] 06/24/2013, 1:23 PM  LOS: 2 days

## 2013-06-25 NOTE — Discharge Summary (Signed)
Physician Discharge Summary  Eric Mueller:[id number] DOB: 11/17/1985 DOA: 06/22/2013  PCP: Lucilla Edin, MD  Admit date: 06/22/2013 Discharge date: 06/25/2013  Time spent: *50 minutes  Recommendations for Outpatient Follow-up:  1. Follow up PCP in 2 weeks  Discharge Diagnoses:  Active Problems:   Seizure disorder   Pancreatitis, acute   Pancreatitis   Discharge Condition: Stable  Diet recommendation: Regular diet  Filed Weights   06/22/13 1925 06/23/13 1000  Weight: 74.844 kg (165 lb) 77.338 kg (170 lb 8 oz)    History of present illness:  28 y.o. male  has a past medical history of Seizure disorder; Seizures; Allergy; Anxiety; and Hepatitis.  Presented with  Patient woke up with nausea, vomiting, abdominal pain. Patient continued to feel poor and presetned to ER.  CT scan done showing acute pancreatitis. Patient has hx f EtOH abuse in recovery last drink was September 14.  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 Reports eating "Junk food"all night prior to this episode.  [name] reports doing well from the stand point of alcoholism.   Hospital Course:  1. Acute Pancreatitis- Patient admitted with pancreatitis, started on IV fluids.Lipase improved to normal, started on the diet Lipid profile shows TG 120, Total cholesterol 143. He has now improved, tolerating the diet, will discharge him home. 2. Seizure disorder- continue keppra 1000 mg po BID    Procedures:  None)  Consultations: None Discharge Exam: Filed Vitals:   06/25/13 0543  BP: 102/57  Pulse: 68  Temp: 97.9 F (36.6 C)  Resp: 16    General: *Appear in no acute distress Cardiovascular: S1s2 RRR Respiratory: *Clear bilaterally  Discharge Instructions  Discharge Orders   Future Orders Complete By Expires   Diet - low sodium heart healthy  As directed    Increase activity slowly  As directed        Medication List         folic acid 1 MG tablet  Commonly known as:   FOLVITE  Take 1 tablet (1 mg total) by mouth daily.     levETIRAcetam 1000 MG tablet  Commonly known as:  KEPPRA  Take 1 tablet (1,000 mg total) by mouth every 12 (twelve) hours.     MULTI-VITAMINS Tabs  Take 1 tablet by mouth daily.     potassium chloride SA 20 MEQ tablet  Commonly known as:  K-DUR,KLOR-CON  Take 1 tablet (20 mEq total) by mouth 2 (two) times daily.       Allergies  Allergen Reactions  . Oxycodone Nausea And Vomiting      The results of significant diagnostics from this hospitalization (including imaging, microbiology, ancillary and laboratory) are listed below for reference.    Significant Diagnostic Studies: Ct Abdomen Pelvis W Contrast  06/23/2013   CLINICAL DATA:  Abdominal pain, nausea and vomiting, leukocytosis. History of hepatitis and pancreatitis .  EXAM: CT ABDOMEN AND PELVIS WITH CONTRAST  TECHNIQUE: Multidetector CT imaging of the abdomen and pelvis was performed using the standard protocol following bolus administration of intravenous contrast.  CONTRAST:  OMNIPAQUE IOHEXOL 300 MG/ML  SOLN  COMPARISON:  US ABDOMEN LIMITED dated 01/16/2013; CT ABD/PELVIS W CM dated 05/09/2012  FINDINGS: Included view of the lung bases demonstrate dependent atelectasis. Visualized heart and pericardium are unremarkable.  Diffuse peripancreatic inflammatory changes with trace free fluid, no abscess, no pancreatic necrosis or pseudocyst. No pancreatic calcifications. Small amount of perihepatic ascites and ascites in the pelvis.  The liver demonstrates  normal density on today's examination, improved, though there is a large, 21 cm in craniocaudad dimension. The, spleen, gallbladder, and adrenal glands are unremarkable.  The stomach, small and large bowel are normal in course and caliber ; mild inflammatory changes about the duodenum are likely reactive. No intraperitoneal free air.  Kidneys are orthotopic, demonstrating symmetric enhancement without nephrolithiasis,  hydronephrosis or renal masses. The unopacified ureters are normal in course and caliber. Delayed imaging through the kidneys demonstrates symmetric prompt excretion to the proximal urinary collecting system. Urinary bladder is partially distended and unremarkable.  Great vessels are normal in course and caliber. No lymphadenopathy by CT size criteria. Internal reproductive organs are unremarkable. The soft tissues and included osseous structures are nonsuspicious.  IMPRESSION: Acute pancreatitis, without CT findings of pancreatic necrosis or pseudocyst cyst. Small amount of peripancreatic free fluid, with small amount of perihepatic ascites and free fluid in the pelvis.  Improved appearance the liver, no CT findings of steatosis on today's examination though there is persistent hepatomegaly.   Electronically Signed   By: Awilda Metroourtnay  Bloomer   On: 06/23/2013 03:55    Microbiology: No results found for this or any previous visit (from the past 240 hour(s)).   Labs: Basic Metabolic Panel:  Recent Labs Lab 06/22/13 2150 06/24/13 0340  NA 138 138  K 4.1 3.9  CL 95* 102  CO2 25 23  GLUCOSE 132* 83  BUN 18 13  CREATININE 0.84 0.84  CALCIUM 10.1 8.7  MG  --  1.6  PHOS  --  3.5   Liver Function Tests:  Recent Labs Lab 06/22/13 2150 06/24/13 0340  AST 140* 40*  ALT 106* 44  ALKPHOS 105 73  BILITOT 2.9* 2.4*  PROT 8.3 6.1  ALBUMIN 4.9 3.4*    Recent Labs Lab 06/22/13 2150 06/24/13 0340  LIPASE 463* 57   No results found for this basename: AMMONIA,  in the last 168 hours CBC:  Recent Labs Lab 06/22/13 2150 06/24/13 0340  WBC 17.8* 5.6  NEUTROABS 14.0*  --   HGB 15.4 11.9*  HCT 42.3 32.5*  MCV 88.3 88.8  PLT 229 124*   Cardiac Enzymes: No results found for this basename: CKTOTAL, CKMB, CKMBINDEX, TROPONINI,  in the last 168 hours BNP: BNP (last 3 results)  Recent Labs  01/16/13 1230 01/19/13 0350  PROBNP 282.4* 41.7   CBG: No results found for this basename:  GLUCAP,  in the last 168 hours     Signed:  Maloree Uplinger S  [facility] 06/25/2013, 10:27 AM

## 2013-06-25 NOTE — Progress Notes (Signed)
Patient assessment has not changed from am. Patient is stable at discharge. Patient was given discharge instructions.

## 2013-09-23 ENCOUNTER — Ambulatory Visit (INDEPENDENT_AMBULATORY_CARE_PROVIDER_SITE_OTHER): Payer: BC Managed Care – PPO | Admitting: Emergency Medicine

## 2013-09-23 VITALS — BP 126/80 | HR 85 | Temp 98.6°F | Resp 18 | Ht 69.0 in | Wt 162.0 lb

## 2013-09-23 DIAGNOSIS — K7010 Alcoholic hepatitis without ascites: Secondary | ICD-10-CM

## 2013-09-23 DIAGNOSIS — Z0000 Encounter for general adult medical examination without abnormal findings: Secondary | ICD-10-CM

## 2013-09-23 DIAGNOSIS — R569 Unspecified convulsions: Secondary | ICD-10-CM

## 2013-09-23 DIAGNOSIS — Z79899 Other long term (current) drug therapy: Secondary | ICD-10-CM

## 2013-09-23 LAB — POCT CBC
Granulocyte percent: 71.3 %G (ref 37–80)
HCT, POC: 36.5 % — AB (ref 43.5–53.7)
HEMOGLOBIN: 12.2 g/dL — AB (ref 14.1–18.1)
LYMPH, POC: 1.8 (ref 0.6–3.4)
MCH: 35 pg — AB (ref 27–31.2)
MCHC: 33.4 g/dL (ref 31.8–35.4)
MCV: 104.5 fL — AB (ref 80–97)
MID (cbc): 0.7 (ref 0–0.9)
MPV: 7.5 fL (ref 0–99.8)
POC Granulocyte: 6.2 (ref 2–6.9)
POC LYMPH %: 20.6 % (ref 10–50)
POC MID %: 8.1 % (ref 0–12)
Platelet Count, POC: 128 10*3/uL — AB (ref 142–424)
RBC: 3.49 M/uL — AB (ref 4.69–6.13)
RDW, POC: 17.1 %
WBC: 8.7 10*3/uL (ref 4.6–10.2)

## 2013-09-23 LAB — COMPLETE METABOLIC PANEL WITH GFR
ALK PHOS: 140 U/L — AB (ref 39–117)
ALT: 41 U/L (ref 0–53)
AST: 104 U/L — AB (ref 0–37)
Albumin: 4.3 g/dL (ref 3.5–5.2)
BUN: 23 mg/dL (ref 6–23)
CO2: 27 mEq/L (ref 19–32)
Calcium: 8.9 mg/dL (ref 8.4–10.5)
Chloride: 95 mEq/L — ABNORMAL LOW (ref 96–112)
Creat: 0.85 mg/dL (ref 0.50–1.35)
GFR, Est African American: >89 mL/min
GFR, Est Non African American: >89 mL/min
Glucose, Bld: 89 mg/dL (ref 70–99)
Potassium: 3.3 mEq/L — ABNORMAL LOW (ref 3.5–5.3)
Sodium: 133 mEq/L — ABNORMAL LOW (ref 135–145)
Total Bilirubin: 2.8 mg/dL — ABNORMAL HIGH (ref 0.2–1.2)
Total Protein: 7.3 g/dL (ref 6.0–8.3)

## 2013-09-23 LAB — POCT URINALYSIS DIPSTICK
Blood, UA: NEGATIVE
Glucose, UA: 100
KETONES UA: 40
Leukocytes, UA: NEGATIVE
Nitrite, UA: NEGATIVE
PH UA: 6
Protein, UA: 100
Spec Grav, UA: 1.02
Urobilinogen, UA: 2

## 2013-09-23 LAB — AMYLASE: Amylase: 35 U/L (ref 0–105)

## 2013-09-23 LAB — MAGNESIUM: Magnesium: 1.7 mg/dL (ref 1.5–2.5)

## 2013-09-23 LAB — LIPASE: Lipase: <10 U/L (ref 0–75)

## 2013-09-23 NOTE — Progress Notes (Addendum)
Subjective:  This chart was scribed for Viviann SpareSteven A. Jackqueline Aquilar MD,    by Ashley JacobsBrittany Andrews, [facility] Scribe. The patient was seen in room 14  and the patient's care was started at 11:07 AM.   Patient ID: Eric Pageobert D Mueller, male    DOB: May 06, 1985, 28 y.o.   MRN: [id number] Chief Complaint  Patient presents with  . Annual Exam   HPI HPI Comments: Eric PageRobert D Mueller is a 28 y.o. male with hx of alcoholic hepatitis and pancreatitis who arrives to the [facility] for an annual exam.  He is having flare ups of pancrease and abdominal pain. He is currently taking 1000 mg of Kepra in the morning and 1000 mg in the evening. Pt has sensitivity to sun and anxiety which he speculate is due to his Keppra dosage.  Pt reports that he sometimes experiences feelings of panic and has to stop because he is mortified of having another seizure. His last seizure was in September. He was [facility] and then transitioned to the [facility]. His last visit with Dr. Elnoria HowardHung was 5 months ago.  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  Pt's neurologist is Dr. Erlinda HongMc Cormick.   Patient Active Problem List   Diagnosis Date Noted  . Pancreatitis, acute 06/23/2013  . Pancreatitis 06/23/2013  . Alcoholic hepatitis 03/08/2013  . Weakness generalized 03/08/2013  . Generalized anxiety disorder 03/08/2013  . GERD (gastroesophageal reflux disease) 03/08/2013  . Encephalopathy, hepatic 03/08/2013  . Protein-calorie malnutrition, severe 01/26/2013  . Acute liver disease 01/25/2013  . Pleural effusion, bilateral 01/17/2013  . Acute pulmonary edema 01/16/2013  . 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 Acute respiratory failure with hypoxia 01/16/2013  . Coagulopathy 01/13/2013  . Hepatitis 01/13/2013  . Hypomagnesemia 11/28/2012  . Ulceration, tongue traumatic 11/28/2012  . Disorder of magnesium metabolism 11/28/2012  . Glossitis 11/28/2012  . Transaminitis 11/27/2012  .  Leukocytosis, unspecified 11/27/2012  . Hypokalemia 11/27/2012  . Thrombocytopenia 05/11/2012  . Alcohol abuse 05/10/2012  . Alcohol withdrawal 05/10/2012  . Acute pancreatitis 05/09/2012  . Seizure disorder    Past Medical History  Diagnosis Date  . Seizure disorder   . Seizures   . Allergy   . Anxiety   . Hepatitis    Past Surgical History  Procedure Laterality Date  . Wisdom tooth extraction     Allergies  Allergen Reactions  . Oxycodone Nausea And Vomiting   Prior to Admission medications   Medication Sig Start Date End Date Taking? Authorizing Provider  levETIRAcetam (KEPPRA) 1000 MG tablet Take 1 tablet (1,000 mg total) by mouth every 12 (twelve) hours. 11/28/12  Yes Maryruth Bunhristina P Rama, MD  Multiple Vitamin (MULTI-VITAMINS) TABS Take 1 tablet by mouth daily.   Yes Historical Provider, MD  potassium chloride SA (K-DUR,KLOR-CON) 20 MEQ tablet Take 1 tablet (20 mEq total) by mouth 2 (two) times daily. 11/28/12  Yes Maryruth Bunhristina P Rama, MD   History   Social History  . Marital Status: Single    Spouse Name: n/a    Number of Children: 0  . Years of Education: N/A   Occupational History  . musician     session work; especially percussion  .     Social History Main Topics  . Smoking status: Never Smoker   . Smokeless tobacco: Never Used  . Alcohol Use: No     Comment: no EtOH since 01/13/2013  . Drug Use: No  . Sexual Activity:  Yes   Other Topics Concern  . Not on file   Social History Narrative   Lives with his mother. Father lives nearby.      Review of Systems  Musculoskeletal: Positive for myalgias.  Skin:       Sensitivity to sun  Psychiatric/Behavioral: The patient is nervous/anxious.        Objective:   Physical Exam  CONSTITUTIONAL: Well developed/well nourished HEAD: Normocephalic/atraumatic EYES: EOMI/PERRL ENMT: Mucous membranes moist NECK: supple no meningeal signs SPINE:entire spine nontender CV: S1/S2 noted, no murmurs/rubs/gallops  noted LUNGS: Lungs are clear to auscultation bilaterally, no apparent distress ABDOMEN: soft, nontender, no rebound or guarding GU:no cva tenderness NEURO: Pt is awake/alert, moves all extremitiesx4 EXTREMITIES: pulses normal, full ROM SKIN: warm, color normal PSYCH: no abnormalities of mood noted   Results for orders placed during the hospital encounter of 06/22/13  CBC WITH DIFFERENTIAL      Result Value Ref Range   WBC 17.8 (*) 4.0 - 10.5 K/uL   RBC 4.79  4.22 - 5.81 MIL/uL   Hemoglobin 15.4  13.0 - 17.0 g/dL   HCT 91.4  78.2 - 95.6 %   MCV 88.3  78.0 - 100.0 fL   MCH 32.2  26.0 - 34.0 pg   MCHC 36.4 (*) 30.0 - 36.0 g/dL   RDW 21.3  08.6 - 57.8 %   Platelets 229  150 - 400 K/uL   Neutrophils Relative % 79 (*) 43 - 77 %   Neutro Abs 14.0 (*) 1.7 - 7.7 K/uL   Lymphocytes Relative 10 (*) 12 - 46 %   Lymphs Abs 1.8  0.7 - 4.0 K/uL   Monocytes Relative 11  3 - 12 %   Monocytes Absolute 1.9 (*) 0.1 - 1.0 K/uL   Eosinophils Relative 1  0 - 5 %   Eosinophils Absolute 0.1  0.0 - 0.7 K/uL   Basophils Relative 0  0 - 1 %   Basophils Absolute 0.0  0.0 - 0.1 K/uL  COMPREHENSIVE METABOLIC PANEL      Result Value Ref Range   Sodium 138  137 - 147 mEq/L   Potassium 4.1  3.7 - 5.3 mEq/L   Chloride 95 (*) 96 - 112 mEq/L   CO2 25  19 - 32 mEq/L   Glucose, Bld 132 (*) 70 - 99 mg/dL   BUN 18  6 - 23 mg/dL   Creatinine, Ser 4.69  0.50 - 1.35 mg/dL   Calcium 62.9  8.4 - 52.8 mg/dL   Total Protein 8.3  6.0 - 8.3 g/dL   Albumin 4.9  3.5 - 5.2 g/dL   AST 413 (*) 0 - 37 U/L   ALT 106 (*) 0 - 53 U/L   Alkaline Phosphatase 105  39 - 117 U/L   Total Bilirubin 2.9 (*) 0.3 - 1.2 mg/dL   GFR calc non Af Amer >90  >90 mL/min   GFR calc Af Amer >90  >90 mL/min  LIPASE, BLOOD      Result Value Ref Range   Lipase 463 (*) 11 - 59 U/L  URINALYSIS, ROUTINE W REFLEX MICROSCOPIC      Result Value Ref Range   Color, Urine AMBER (*) YELLOW   APPearance CLEAR  CLEAR   Specific Gravity, Urine 1.039 (*)  1.005 - 1.030   pH 5.5  5.0 - 8.0   Glucose, UA NEGATIVE  NEGATIVE mg/dL   Hgb urine dipstick NEGATIVE  NEGATIVE   Bilirubin Urine SMALL (*) NEGATIVE  Ketones, ur NEGATIVE  NEGATIVE mg/dL   Protein, ur 30 (*) NEGATIVE mg/dL   Urobilinogen, UA 0.2  0.0 - 1.0 mg/dL   Nitrite NEGATIVE  NEGATIVE   Leukocytes, UA NEGATIVE  NEGATIVE  URINE MICROSCOPIC-ADD ON      Result Value Ref Range   Squamous Epithelial / LPF RARE  RARE   WBC, UA 0-2  <3 WBC/hpf   Bacteria, UA FEW (*) RARE   Urine-Other MUCOUS PRESENT    MAGNESIUM      Result Value Ref Range   Magnesium 1.6  1.5 - 2.5 mg/dL  PHOSPHORUS      Result Value Ref Range   Phosphorus 3.5  2.3 - 4.6 mg/dL  TSH      Result Value Ref Range   TSH 2.055  0.350 - 4.500 uIU/mL  COMPREHENSIVE METABOLIC PANEL      Result Value Ref Range   Sodium 138  137 - 147 mEq/L   Potassium 3.9  3.7 - 5.3 mEq/L   Chloride 102  96 - 112 mEq/L   CO2 23  19 - 32 mEq/L   Glucose, Bld 83  70 - 99 mg/dL   BUN 13  6 - 23 mg/dL   Creatinine, Ser 9.81  0.50 - 1.35 mg/dL   Calcium 8.7  8.4 - 19.1 mg/dL   Total Protein 6.1  6.0 - 8.3 g/dL   Albumin 3.4 (*) 3.5 - 5.2 g/dL   AST 40 (*) 0 - 37 U/L   ALT 44  0 - 53 U/L   Alkaline Phosphatase 73  39 - 117 U/L   Total Bilirubin 2.4 (*) 0.3 - 1.2 mg/dL   GFR calc non Af Amer >90  >90 mL/min   GFR calc Af Amer >90  >90 mL/min  CBC      Result Value Ref Range   WBC 5.6  4.0 - 10.5 K/uL   RBC 3.66 (*) 4.22 - 5.81 MIL/uL   Hemoglobin 11.9 (*) 13.0 - 17.0 g/dL   HCT 47.8 (*) 29.5 - 62.1 %   MCV 88.8  78.0 - 100.0 fL   MCH 32.5  26.0 - 34.0 pg   MCHC 36.6 (*) 30.0 - 36.0 g/dL   RDW 30.8  65.7 - 84.6 %   Platelets 124 (*) 150 - 400 K/uL  LIPID PANEL      Result Value Ref Range   Cholesterol 143  0 - 200 mg/dL   Triglycerides 962  <952 mg/dL   HDL 48  >84 mg/dL   Total CHOL/HDL Ratio 3.0     VLDL 24  0 - 40 mg/dL   LDL Cholesterol 71  0 - 99 mg/dL  LIPASE, BLOOD      Result Value Ref Range   Lipase 57  11 -  59 U/L    DIAGNOSTIC STUDIES: Oxygen Saturation is 100% on room air, normal by my interpretation.    COORDINATION OF CARE:  11:12 AM Discussed course of care with pt which includes laboratory tests. Pt understands and agrees.  Results for orders placed in visit on 09/23/13  POCT CBC      Result Value Ref Range   WBC 8.7  4.6 - 10.2 K/uL   Lymph, poc 1.8  0.6 - 3.4   POC LYMPH PERCENT 20.6  10 - 50 %L   MID (cbc) 0.7  0 - 0.9   POC MID % 8.1  0 - 12 %M   POC Granulocyte 6.2  2 - 6.9  Granulocyte percent 71.3  37 - 80 %G   RBC 3.49 (*) 4.69 - 6.13 M/uL   Hemoglobin 12.2 (*) 14.1 - 18.1 g/dL   HCT, POC 82.5 (*) 00.3 - 53.7 %   MCV 104.5 (*) 80 - 97 fL   MCH, POC 35.0 (*) 27 - 31.2 pg   MCHC 33.4  31.8 - 35.4 g/dL   RDW, POC 70.4     Platelet Count, POC 128 (*) 142 - 424 K/uL   MPV 7.5  0 - 99.8 fL  POCT URINALYSIS DIPSTICK      Result Value Ref Range   Color, UA dk orange     Clarity, UA cloud     Glucose, UA 100     Bilirubin, UA large     Ketones, UA 40     Spec Grav, UA 1.020     Blood, UA neg     pH, UA 6.0     Protein, UA 100     Urobilinogen, UA 2.0     Nitrite, UA neg     Leukocytes, UA Negative           Assessment & Plan:  Patient has been able to abstain from alcohol. He does have some upper abdominal discomfort. Referral made to Dr. Elnoria Howard for this. He is on a 2000 mg a day of Keppra for his seizure disorder. Referral made to Dr. Kathlene November for evaluation. He is requesting a small supply of medications there have for anxiety. I would like to go ahead and get these to referrals done and then see the patient back and decide on what might be the safest medications for him to take for anxiety his urine was quite dark showed positive for glucose and large bilirubin. Awaiting his blood test results   I personally performed the services described in this documentation, which was scribed in my presence. The recorded information has been reviewed and is accurate.Marland Kitchen

## 2013-09-24 LAB — HEPATITIS B SURFACE ANTIBODY, QUANTITATIVE: HEPATITIS B-POST: 1 m[IU]/mL

## 2013-09-24 LAB — PROTIME-INR
INR: 1.14 (ref <1.50)
Prothrombin Time: 14.5 seconds (ref 11.6–15.2)

## 2013-09-24 LAB — T4, FREE: Free T4: 1.24 ng/dL (ref 0.80–1.80)

## 2013-09-24 LAB — HEPATITIS C ANTIBODY: HCV AB: NEGATIVE

## 2013-09-24 LAB — HEPATITIS A ANTIBODY, TOTAL: Hep A Total Ab: NONREACTIVE

## 2013-09-24 LAB — TSH: TSH: 2.597 u[IU]/mL (ref 0.350–4.500)

## 2013-09-28 ENCOUNTER — Telehealth: Payer: Self-pay

## 2013-09-28 NOTE — Telephone Encounter (Signed)
His bilirubin is somewhat elevated. Be sure he gets his appointment with Dr. Elnoria Howard. His potassium level is low at 3.3 and he needs to take at least one potassium pill a day. Pancreas enzymes are normal. He is not immune to hepatitis B or hepatitis A and needs to get started on these immunizations.

## 2013-09-28 NOTE — Telephone Encounter (Signed)
Pt states was treated in our office on Friday 09/25/13. States as a result of labs done on that date, he was advised to get the "Hep Immunizations"  Pt wants to make sure of what he needs to get and verify that everything can be done in our office

## 2013-09-29 NOTE — Telephone Encounter (Signed)
Re went over labs with the pt. He will be in for his Hep A and Hep B. Future order's placed.

## 2013-09-30 LAB — LEVETIRACETAM LEVEL: KEPPRA (LEVETIRACETAM): 28.3 ug/mL

## 2013-11-01 ENCOUNTER — Inpatient Hospital Stay (HOSPITAL_COMMUNITY)
Admission: EM | Admit: 2013-11-01 | Discharge: 2013-11-03 | DRG: 100 | Disposition: A | Discharge status: Home / Self Care | Payer: BC Managed Care – PPO | Attending: Internal Medicine | Admitting: Internal Medicine

## 2013-11-01 ENCOUNTER — Encounter (HOSPITAL_COMMUNITY): Payer: Self-pay | Admitting: Emergency Medicine

## 2013-11-01 DIAGNOSIS — Z79899 Other long term (current) drug therapy: Secondary | ICD-10-CM

## 2013-11-01 DIAGNOSIS — G9340 Encephalopathy, unspecified: Secondary | ICD-10-CM

## 2013-11-01 DIAGNOSIS — R569 Unspecified convulsions: Secondary | ICD-10-CM | POA: Diagnosis present

## 2013-11-01 DIAGNOSIS — G40901 Epilepsy, unspecified, not intractable, with status epilepticus: Secondary | ICD-10-CM | POA: Diagnosis present

## 2013-11-01 DIAGNOSIS — D649 Anemia, unspecified: Secondary | ICD-10-CM | POA: Diagnosis present

## 2013-11-01 DIAGNOSIS — F1011 Alcohol abuse, in remission: Secondary | ICD-10-CM | POA: Diagnosis present

## 2013-11-01 DIAGNOSIS — Z885 Allergy status to narcotic agent status: Secondary | ICD-10-CM

## 2013-11-01 DIAGNOSIS — K7010 Alcoholic hepatitis without ascites: Secondary | ICD-10-CM | POA: Diagnosis present

## 2013-11-01 DIAGNOSIS — K769 Liver disease, unspecified: Secondary | ICD-10-CM | POA: Diagnosis present

## 2013-11-01 DIAGNOSIS — Z8249 Family history of ischemic heart disease and other diseases of the circulatory system: Secondary | ICD-10-CM

## 2013-11-01 DIAGNOSIS — R7401 Elevation of levels of liver transaminase levels: Secondary | ICD-10-CM | POA: Diagnosis present

## 2013-11-01 DIAGNOSIS — R740 Nonspecific elevation of levels of transaminase and lactic acid dehydrogenase [LDH]: Secondary | ICD-10-CM

## 2013-11-01 DIAGNOSIS — K7290 Hepatic failure, unspecified without coma: Secondary | ICD-10-CM | POA: Diagnosis present

## 2013-11-01 DIAGNOSIS — G40909 Epilepsy, unspecified, not intractable, without status epilepticus: Secondary | ICD-10-CM | POA: Diagnosis present

## 2013-11-01 DIAGNOSIS — R7402 Elevation of levels of lactic acid dehydrogenase (LDH): Secondary | ICD-10-CM | POA: Diagnosis present

## 2013-11-01 DIAGNOSIS — K219 Gastro-esophageal reflux disease without esophagitis: Secondary | ICD-10-CM

## 2013-11-01 DIAGNOSIS — K7682 Hepatic encephalopathy: Secondary | ICD-10-CM

## 2013-11-01 DIAGNOSIS — E876 Hypokalemia: Secondary | ICD-10-CM | POA: Diagnosis present

## 2013-11-01 DIAGNOSIS — D696 Thrombocytopenia, unspecified: Secondary | ICD-10-CM | POA: Diagnosis present

## 2013-11-01 DIAGNOSIS — G40401 Other generalized epilepsy and epileptic syndromes, not intractable, with status epilepticus: Principal | ICD-10-CM | POA: Diagnosis present

## 2013-11-01 LAB — CBC
HCT: 29.3 % — ABNORMAL LOW (ref 39.0–52.0)
Hemoglobin: 10.7 g/dL — ABNORMAL LOW (ref 13.0–17.0)
MCH: 36.4 pg — AB (ref 26.0–34.0)
MCHC: 36.5 g/dL — AB (ref 30.0–36.0)
MCV: 99.7 fL (ref 78.0–100.0)
PLATELETS: 54 10*3/uL — AB (ref 150–400)
RBC: 2.94 MIL/uL — ABNORMAL LOW (ref 4.22–5.81)
RDW: 14.7 % (ref 11.5–15.5)
WBC: 7.9 10*3/uL (ref 4.0–10.5)

## 2013-11-01 LAB — URINALYSIS, ROUTINE W REFLEX MICROSCOPIC
BILIRUBIN URINE: NEGATIVE
Glucose, UA: NEGATIVE mg/dL
Ketones, ur: NEGATIVE mg/dL
LEUKOCYTES UA: NEGATIVE
NITRITE: NEGATIVE
PH: 5.5 (ref 5.0–8.0)
Protein, ur: 100 mg/dL — AB
SPECIFIC GRAVITY, URINE: 1.017 (ref 1.005–1.030)
Urobilinogen, UA: 0.2 mg/dL (ref 0.0–1.0)

## 2013-11-01 LAB — GLUCOSE, CAPILLARY: Glucose-Capillary: 108 mg/dL — ABNORMAL HIGH (ref 70–99)

## 2013-11-01 LAB — I-STAT CHEM 8, ED
BUN: 6 mg/dL (ref 6–23)
CALCIUM ION: 1.06 mmol/L — AB (ref 1.12–1.23)
Chloride: 95 mEq/L — ABNORMAL LOW (ref 96–112)
Creatinine, Ser: 1 mg/dL (ref 0.50–1.35)
Glucose, Bld: 248 mg/dL — ABNORMAL HIGH (ref 70–99)
HCT: 43 % (ref 39.0–52.0)
Hemoglobin: 14.6 g/dL (ref 13.0–17.0)
Potassium: 3.3 mEq/L — ABNORMAL LOW (ref 3.7–5.3)
Sodium: 138 mEq/L (ref 137–147)
TCO2: 10 mmol/L (ref 0–100)

## 2013-11-01 LAB — HEPATIC FUNCTION PANEL
ALT: 69 U/L — AB (ref 0–53)
AST: 315 U/L — ABNORMAL HIGH (ref 0–37)
Albumin: 4.6 g/dL (ref 3.5–5.2)
Alkaline Phosphatase: 165 U/L — ABNORMAL HIGH (ref 39–117)
BILIRUBIN DIRECT: 0.6 mg/dL — AB (ref 0.0–0.3)
BILIRUBIN INDIRECT: 1.4 mg/dL — AB (ref 0.3–0.9)
TOTAL PROTEIN: 7.9 g/dL (ref 6.0–8.3)
Total Bilirubin: 2 mg/dL — ABNORMAL HIGH (ref 0.3–1.2)

## 2013-11-01 LAB — RAPID URINE DRUG SCREEN, HOSP PERFORMED
Amphetamines: NOT DETECTED
BARBITURATES: NOT DETECTED
Benzodiazepines: NOT DETECTED
Cocaine: NOT DETECTED
Opiates: NOT DETECTED
Tetrahydrocannabinol: NOT DETECTED

## 2013-11-01 LAB — URINE MICROSCOPIC-ADD ON

## 2013-11-01 LAB — LIPASE, BLOOD: LIPASE: 28 U/L (ref 11–59)

## 2013-11-01 LAB — MRSA PCR SCREENING: MRSA by PCR: POSITIVE — AB

## 2013-11-01 LAB — ETHANOL: Alcohol, Ethyl (B): <11 mg/dL (ref 0–11)

## 2013-11-01 LAB — AMMONIA: AMMONIA: 235 umol/L — AB (ref 11–60)

## 2013-11-01 MED ORDER — ADULT MULTIVITAMIN W/MINERALS CH
1.0000 | ORAL_TABLET | Freq: Every day | ORAL | Status: DC
  Administered 2013-11-02 – 2013-11-03 (×2): 1 via ORAL
  Filled 2013-11-01 (×2): qty 1

## 2013-11-01 MED ORDER — INSULIN ASPART 100 UNIT/ML ~~LOC~~ SOLN
0.0000 [IU] | Freq: Three times a day (TID) | SUBCUTANEOUS | Status: DC
  Administered 2013-11-02: 1 [IU] via SUBCUTANEOUS

## 2013-11-01 MED ORDER — THIAMINE HCL 100 MG/ML IJ SOLN: 100.0000 mg | Freq: Every day | INTRAMUSCULAR | Status: DC

## 2013-11-01 MED ORDER — POTASSIUM CHLORIDE ER 10 MEQ PO TBCR
20.0000 meq | EXTENDED_RELEASE_TABLET | Freq: Three times a day (TID) | ORAL | Status: DC
  Administered 2013-11-01 – 2013-11-03 (×6): 20 meq via ORAL
  Filled 2013-11-01 (×9): qty 2

## 2013-11-01 MED ORDER — LORAZEPAM 2 MG/ML IJ SOLN
1.0000 mg | Freq: Once | INTRAMUSCULAR | Status: AC
  Administered 2013-11-01: 1 mg via INTRAVENOUS

## 2013-11-01 MED ORDER — ALUM & MAG HYDROXIDE-SIMETH 200-200-20 MG/5ML PO SUSP
30.0000 mL | ORAL | Status: DC | PRN
  Administered 2013-11-01: 30 mL via ORAL
  Filled 2013-11-01: qty 30

## 2013-11-01 MED ORDER — SODIUM CHLORIDE 0.9 % IJ SOLN
3.0000 mL | Freq: Two times a day (BID) | INTRAMUSCULAR | Status: DC
  Administered 2013-11-02 – 2013-11-03 (×3): 3 mL via INTRAVENOUS

## 2013-11-01 MED ORDER — POTASSIUM CHLORIDE CRYS ER 20 MEQ PO TBCR: 20.0000 meq | EXTENDED_RELEASE_TABLET | Freq: Two times a day (BID) | ORAL | Status: DC

## 2013-11-01 MED ORDER — LACTULOSE 10 GM/15ML PO SOLN
30.0000 g | Freq: Once | ORAL | Status: AC
  Administered 2013-11-01: 30 g via ORAL
  Filled 2013-11-01: qty 45

## 2013-11-01 MED ORDER — THIAMINE HCL 100 MG/ML IJ SOLN
Freq: Once | INTRAVENOUS | Status: AC
  Administered 2013-11-01: 22:00:00 via INTRAVENOUS
  Filled 2013-11-01: qty 1000

## 2013-11-01 MED ORDER — ONDANSETRON HCL 4 MG/2ML IJ SOLN
4.0000 mg | Freq: Once | INTRAMUSCULAR | Status: AC
  Administered 2013-11-01: 4 mg via INTRAVENOUS

## 2013-11-01 MED ORDER — ZOLPIDEM TARTRATE 5 MG PO TABS: 5.0000 mg | ORAL_TABLET | Freq: Every evening | ORAL | Status: DC | PRN

## 2013-11-01 MED ORDER — MUPIROCIN 2 % EX OINT
1.0000 "application " | TOPICAL_OINTMENT | Freq: Two times a day (BID) | CUTANEOUS | Status: DC
  Administered 2013-11-02 – 2013-11-03 (×3): 1 via NASAL
  Filled 2013-11-01 (×2): qty 22

## 2013-11-01 MED ORDER — HYDROCODONE-ACETAMINOPHEN 5-325 MG PO TABS
1.0000 | ORAL_TABLET | ORAL | Status: DC | PRN
Start: 2013-11-01 — End: 2013-11-02
  Administered 2013-11-01: 1 via ORAL
  Filled 2013-11-01: qty 1

## 2013-11-01 MED ORDER — FOLIC ACID 5 MG/ML IJ SOLN
1.0000 mg | Freq: Every day | INTRAMUSCULAR | Status: DC
  Administered 2013-11-02: 1 mg via INTRAVENOUS
  Filled 2013-11-01 (×3): qty 0.2

## 2013-11-01 MED ORDER — LORAZEPAM 2 MG/ML IJ SOLN
INTRAMUSCULAR | Status: AC
Start: 2013-11-01 — End: 2013-11-02
  Filled 2013-11-01: qty 1

## 2013-11-01 MED ORDER — CHLORHEXIDINE GLUCONATE CLOTH 2 % EX PADS
6.0000 | MEDICATED_PAD | Freq: Every day | CUTANEOUS | Status: DC
  Administered 2013-11-02 – 2013-11-03 (×2): 6 via TOPICAL

## 2013-11-01 MED ORDER — LORAZEPAM 2 MG/ML IJ SOLN
1.0000 mg | INTRAMUSCULAR | Status: DC | PRN
  Administered 2013-11-01: 1 mg via INTRAVENOUS
  Filled 2013-11-01: qty 1

## 2013-11-01 MED ORDER — ONDANSETRON HCL 4 MG/2ML IJ SOLN
4.0000 mg | INTRAMUSCULAR | Status: DC | PRN
  Administered 2013-11-01: 4 mg via INTRAVENOUS
  Filled 2013-11-01: qty 2

## 2013-11-01 MED ORDER — ONDANSETRON HCL 4 MG/2ML IJ SOLN
INTRAMUSCULAR | Status: AC
  Filled 2013-11-01: qty 2

## 2013-11-01 MED ORDER — SODIUM CHLORIDE 0.9 % IV BOLUS (SEPSIS)
1000.0000 mL | Freq: Once | INTRAVENOUS | Status: AC
  Administered 2013-11-01: 1000 mL via INTRAVENOUS

## 2013-11-01 MED ORDER — LACTULOSE 10 GM/15ML PO SOLN
45.0000 g | Freq: Four times a day (QID) | ORAL | Status: DC
  Administered 2013-11-01 – 2013-11-02 (×3): 45 g via ORAL
  Filled 2013-11-01 (×7): qty 90

## 2013-11-01 MED ORDER — LEVETIRACETAM 500 MG PO TABS
1000.0000 mg | ORAL_TABLET | Freq: Two times a day (BID) | ORAL | Status: DC
  Administered 2013-11-01: 1000 mg via ORAL
  Filled 2013-11-01: qty 2

## 2013-11-01 MED ORDER — ENOXAPARIN SODIUM 40 MG/0.4ML ~~LOC~~ SOLN: 40.0000 mg | SUBCUTANEOUS | Status: DC

## 2013-11-01 MED ORDER — THIAMINE HCL 100 MG/ML IJ SOLN
100.0000 mg | Freq: Every day | INTRAMUSCULAR | Status: DC
  Administered 2013-11-02: 100 mg via INTRAVENOUS
  Filled 2013-11-01: qty 2
  Filled 2013-11-01: qty 1

## 2013-11-01 MED ORDER — INSULIN ASPART 100 UNIT/ML ~~LOC~~ SOLN: 0.0000 [IU] | Freq: Every day | SUBCUTANEOUS | Status: DC

## 2013-11-01 NOTE — ED Notes (Addendum)
[facility] after a witnessed seizure.Per EMS- Pt has hx of seizures. Pt had no incontinence and did not hit head. Pt helped to ground prior to seizure. Per EMS- pt had another seizure after getting into ambulance. Pt father states that pt has never had back to back seizures and pt last seizure was 9 mths ago. Pt confused and post-ictal at this time. Pt in NAD. Pt states that he took his Keppra today.

## 2013-11-01 NOTE — H&P (Addendum)
[facility]                                                                                    Patient Demographics  Eric Mueller, is a 28 y.o. male  CSN: [id number]  MRN: [id number]  DOB - 06-27-1985  Admit Date - 11/01/2013  Outpatient Primary MD for the patient is DAUB, Stan Head, MD   With History of -  Past Medical History  Diagnosis Date  . Seizure disorder   . Seizures   . Allergy   . Anxiety   . Hepatitis       Past Surgical History  Procedure Laterality Date  . Wisdom tooth extraction      in for   Chief Complaint  Patient presents with  . Seizures     HPI  Eric Mueller  is a 28 y.o. male, with history of alcohol related seizures on Keppra and history of alcoholism, sober for the last 6-8 months, presenting with seizure episode that happened today. Patient felt the seizures come in and told his father was decided and we will turn him into the left side and he started seizing for 30 minutes. The patient has been compliant with his Keppra 1 mg twice a day and he is being followed by [facility] neurology. In the emergency room he received Ativan and is feeling better now. Patient has history of alcoholism with alcoholic hepatitis, pancreatitis, and history of hepatic encephalopathy. In the emergency room his ammonia level was noted to be elevated and he was started on lactulose.   Review of Systems    In addition to the HPI above,  No Fever-chills, No Headache, No changes with Vision or hearing, No problems swallowing food or Liquids, No Chest pain, Cough or Shortness of Breath, No Abdominal pain, No Nausea or Vommitting, Bowel movements are regular, No Blood in stool or Urine, No dysuria, No new skin rashes or bruises,  No new weakness, tingling, numbness in any extremity, No recent weight gain or loss, No polyuria, polydypsia or polyphagia, No significant Mental Stressors.  A full 10 point Review of Systems was done, except as stated  above, all other Review of Systems were negative.   Social History History  Substance Use Topics  . Smoking status: Never Smoker   . Smokeless tobacco: Never Used  . Alcohol Use: No     Comment: no EtOH since 01/13/2013     Family History Family History  Problem Relation Age of Onset  . Heart disease      cardiovascular disease  . Cancer    . Hypertension Father      Prior to Admission medications   Medication Sig Start Date End Date Taking? Authorizing Provider  levETIRAcetam (KEPPRA) 1000 MG tablet Take 1 tablet (1,000 mg total) by mouth every 12 (twelve) hours. 11/28/12  Yes Maryruth Bun Rama, MD  Multiple Vitamin (MULTI-VITAMINS) TABS Take 1 tablet by mouth daily.   Yes Historical Provider, MD  potassium chloride SA (K-DUR,KLOR-CON) 20 MEQ tablet Take 1 tablet (20 mEq total) by mouth 2 (two) times daily. 11/28/12  Yes Maryruth Bun Rama, MD    Allergies  Allergen  Reactions  . Oxycodone Nausea And Vomiting    Physical Exam  Vitals  Blood pressure 135/89, pulse 112, temperature 98.7 F (37.1 C), temperature source Oral, resp. rate 25, SpO2 98.00%.   1. General Young white American male, anxious, very pleasant in no acute distress  2. Normal affect and insight, Not Suicidal or Homicidal, Awake Alert, Oriented X 3.  3. No F.N deficits, ALL C.Nerves Intact, Strength 5/5 all 4 extremities, Sensation intact all 4 extremities, Plantars down going.  4. Ears and Eyes appear Normal, Conjunctivae clear, PERRLA. Moist Oral Mucosa.  5. Supple Neck, No JVD, No cervical lymphadenopathy appriciated, No Carotid Bruits.  6. Symmetrical Chest wall movement, Good air movement bilaterally, CTAB.  7. RRR, No Gallops, Rubs or Murmurs, No Parasternal Heave.  8. Positive Bowel Sounds, Abdomen Soft, Non tender, No organomegaly appriciated,No rebound -guarding or rigidity.  9.  No Cyanosis, Normal Skin Turgor, No Skin Rash or Bruise.  10. Good muscle tone,  joints appear normal , no  effusions, Normal ROM.  11. No Palpable Lymph Nodes in Neck or Axillae    Data Review  CBC  Recent Labs Lab 11/01/13 1643  HGB 14.6  HCT 43.0   ------------------------------------------------------------------------------------------------------------------  Chemistries   Recent Labs Lab 11/01/13 1643 11/01/13 1659  NA 138  --   K 3.3*  --   CL 95*  --   GLUCOSE 248*  --   BUN 6  --   CREATININE 1.00  --   AST  --  315*  ALT  --  69*  ALKPHOS  --  165*  BILITOT  --  2.0*   ------------------------------------------------------------------------------------------------------------------ CrCl is unknown because both a height and weight (above a minimum accepted value) are required for this calculation. ------------------------------------------------------------------------------------------------------------------ No results found for this basename: TSH, T4TOTAL, FREET3, T3FREE, THYROIDAB,  in the last 72 hours   Coagulation profile No results found for this basename: INR, PROTIME,  in the last 168 hours ------------------------------------------------------------------------------------------------------------------- No results found for this basename: DDIMER,  in the last 72 hours -------------------------------------------------------------------------------------------------------------------  Cardiac Enzymes No results found for this basename: CK, CKMB, TROPONINI, MYOGLOBIN,  in the last 168 hours ------------------------------------------------------------------------------------------------------------------ No components found with this basename: POCBNP,    ---------------------------------------------------------------------------------------------------------------  Urinalysis    Component Value Date/Time   COLORURINE AMBER* 11/01/2013 1702   APPEARANCEUR CLOUDY* 11/01/2013 1702   LABSPEC 1.017 11/01/2013 1702   PHURINE 5.5 11/01/2013 1702   GLUCOSEU  NEGATIVE 11/01/2013 1702   HGBUR MODERATE* 11/01/2013 1702   BILIRUBINUR NEGATIVE 11/01/2013 1702   BILIRUBINUR large 09/23/2013 1156   KETONESUR NEGATIVE 11/01/2013 1702   PROTEINUR 100* 11/01/2013 1702   PROTEINUR 100 09/23/2013 1156   UROBILINOGEN 0.2 11/01/2013 1702   UROBILINOGEN 2.0 09/23/2013 1156   NITRITE NEGATIVE 11/01/2013 1702   NITRITE neg 09/23/2013 1156   LEUKOCYTESUR NEGATIVE 11/01/2013 1702    ----------------------------------------------------------------------------------------------------------------     Imaging results:    Assessment & Plan  1.  breakthrough seizure in a patient with history of alcohol-related seizures on Keppra and is compliant      check Keppra level      Ativan when necessary      ? EEG if no improvement      Patient[facility] neurologist [facility] at [facility]      Neurochecks      Keep in step down 2. History of alcoholism     Sober for the last 6-8 months     Multivitamins with thiamine and folate 3. Hepatic encephalopathy  with elevated ammonia level     Lactulose     Follow ammonia level in a.m. 4. Hyperglycemia     Insulin sliding scale     Check hemoglobin A1c 5. Hypokalemia     Replace by mouth 6. Thrombocytopenia     Liver or alcohol related     Monitor   DVT Prophylaxis  Lovenox  AM Labs Ordered, also please review Full Orders  Family Communication: Admission, patients condition and plan of care including tests being ordered have been discussed with the patient and father who indicate understanding and agree with the plan and Code Status.  Code Status full  Disposition Plan: Home  Time spent in minutes : 37 minutes  Condition GUARDED    @SIGNATURE @

## 2013-11-01 NOTE — ED Notes (Signed)
Bed: WA12 Expected date:  Expected time:  Means of arrival:  Comments: EMS- seizure 

## 2013-11-01 NOTE — ED Notes (Signed)
Awaiting meds to arrive from pharmacy and hospitalist consult. Pt A & O, no further seizure activity noted, seizure pads in place, pt and family state last seizure activity in September, compliant with medication. Pt made aware of Ammonia level and liver enzyme levels, pt states they have been WNL since he stopped drinking etoh.

## 2013-11-01 NOTE — ED Provider Notes (Signed)
CSN: [id number]     Arrival date & time 11/01/13  1618 History   First MD Initiated Contact with Patient 11/01/13 1613     Chief Complaint  Patient presents with  . Seizures     (Consider location/radiation/quality/duration/timing/severity/associated sxs/prior Treatment) HPI A LEVEL 5 CAVEAT PERTAINS DUE TO ALTERED MENTAL STATUS/POST ICTAL Pt with hx of seizure disorder presents with c/o seizure activity.  Per EMS he had a seizure at home and then another generalized tonic clonic seizure upon getting into the ambulance.  Both were self limited. He takes keppra for seizures.  Last seizure was 9 months ago.  No hx of fever, or other preceding illness.  No   Past Medical History  Diagnosis Date  . Seizure disorder   . Seizures   . Allergy   . Anxiety   . Hepatitis    Past Surgical History  Procedure Laterality Date  . Wisdom tooth extraction     Family History  Problem Relation Age of Onset  . Heart disease      cardiovascular disease  . Cancer    . Hypertension Father    History  Substance Use Topics  . Smoking status: Never Smoker   . Smokeless tobacco: Never Used  . Alcohol Use: No     Comment: no EtOH since 01/13/2013    Review of Systems UNABLE TO OBTAIN ROS DUE TO LEVEL 5 CAVEAT    Allergies  Oxycodone  Home Medications   Prior to Admission medications   Medication Sig Start Date End Date Taking? Authorizing Provider  Multiple Vitamin (MULTI-VITAMINS) TABS Take 1 tablet by mouth daily.   Yes Historical Provider, MD  potassium chloride SA (K-DUR,KLOR-CON) 20 MEQ tablet Take 1 tablet (20 mEq total) by mouth 2 (two) times daily. 11/28/12  Yes Maryruth Bunhristina P Rama, MD  levETIRAcetam (KEPPRA) 1000 MG tablet Take 1.5 tablets (1,500 mg total) by mouth 2 (two) times daily. 11/02/13   Nishant Dhungel, MD   BP 126/72  Pulse 86  Temp(Src) 98.6 F (37 C) (Oral)  Resp 20  Ht 5\' 10"  (1.778 m)  Wt 163 lb 11.2 oz (74.254 kg)  BMI 23.49 kg/m2  SpO2 100% Vitals  reviewed Physical Exam Physical Examination: General appearance - alert, tired appearing, and in no distress Mental status - alert, oriented to person, place, not to time Eyes - pupils equal and reactive, extraocular eye movements intact, no nystagmus, no conjunctival injection, no scleral icterus Mouth - mucous membranes moist, pharynx normal without lesions Chest - clear to auscultation, no wheezes, rales or rhonchi, symmetric air entry Heart - normal rate, regular rhythm, normal S1, S2, no murmurs, rubs, clicks or gallops Abdomen - soft, nontender, nondistended, no masses or organomegaly Neurological - alert, oriented, normal speech, no focal findings or movement disorder noted Extremities - peripheral pulses normal, no pedal edema, no clubbing or cyanosis Skin - normal coloration and turgor, no rashes  ED Course  Procedures (including critical care time)  7:58 PM pt has not completely returned to his baseline, LFTS somewhat elevated above baseline, ammonia checked and it is elevated at 235. Lactulose ordered.  Pt denies drinking ETOH in the past sevearl months. No further seizure activity, d/w triad and patient will be admitted to step down bed.  Labs Review Labs Reviewed  MRSA PCR SCREENING - Abnormal; Notable for the following:    MRSA by PCR POSITIVE (*)    All other components within normal limits  URINALYSIS, ROUTINE W REFLEX MICROSCOPIC - Abnormal; Notable  for the following:    Color, Urine AMBER (*)    APPearance CLOUDY (*)    Hgb urine dipstick MODERATE (*)    Protein, ur 100 (*)    All other components within normal limits  HEPATIC FUNCTION PANEL - Abnormal; Notable for the following:    AST 315 (*)    ALT 69 (*)    Alkaline Phosphatase 165 (*)    Total Bilirubin 2.0 (*)    Bilirubin, Direct 0.6 (*)    Indirect Bilirubin 1.4 (*)    All other components within normal limits  URINE MICROSCOPIC-ADD ON - Abnormal; Notable for the following:    Casts HYALINE CASTS (*)     All other components within normal limits  AMMONIA - Abnormal; Notable for the following:    Ammonia 235 (*)    All other components within normal limits  CBC - Abnormal; Notable for the following:    RBC 2.94 (*)    Hemoglobin 10.7 (*)    HCT 29.3 (*)    MCH 36.4 (*)    MCHC 36.5 (*)    Platelets 54 (*)    All other components within normal limits  COMPREHENSIVE METABOLIC PANEL - Abnormal; Notable for the following:    Potassium 3.0 (*)    Glucose, Bld 105 (*)    BUN 5 (*)    AST 348 (*)    ALT 65 (*)    Total Bilirubin 3.4 (*)    All other components within normal limits  HEMOGLOBIN A1C - Abnormal; Notable for the following:    Hemoglobin A1C 5.8 (*)    Mean Plasma Glucose 120 (*)    All other components within normal limits  CBC - Abnormal; Notable for the following:    RBC 2.95 (*)    Hemoglobin 10.8 (*)    HCT 29.7 (*)    MCV 100.7 (*)    MCH 36.6 (*)    MCHC 36.4 (*)    Platelets 44 (*)    All other components within normal limits  GLUCOSE, CAPILLARY - Abnormal; Notable for the following:    Glucose-Capillary 108 (*)    All other components within normal limits  GLUCOSE, CAPILLARY - Abnormal; Notable for the following:    Glucose-Capillary 115 (*)    All other components within normal limits  GLUCOSE, CAPILLARY - Abnormal; Notable for the following:    Glucose-Capillary 138 (*)    All other components within normal limits  PROTIME-INR - Abnormal; Notable for the following:    Prothrombin Time 16.9 (*)    All other components within normal limits  CBC - Abnormal; Notable for the following:    RBC 2.95 (*)    Hemoglobin 10.6 (*)    HCT 30.3 (*)    MCV 102.7 (*)    MCH 35.9 (*)    Platelets 50 (*)    All other components within normal limits  COMPREHENSIVE METABOLIC PANEL - Abnormal; Notable for the following:    Glucose, Bld 101 (*)    AST 252 (*)    ALT 64 (*)    Alkaline Phosphatase 119 (*)    Total Bilirubin 3.0 (*)    All other components within  normal limits  GLUCOSE, CAPILLARY - Abnormal; Notable for the following:    Glucose-Capillary 121 (*)    All other components within normal limits  GLUCOSE, CAPILLARY - Abnormal; Notable for the following:    Glucose-Capillary 101 (*)    All other components within normal  limits  GLUCOSE, CAPILLARY - Abnormal; Notable for the following:    Glucose-Capillary 106 (*)    All other components within normal limits  I-STAT CHEM 8, ED - Abnormal; Notable for the following:    Potassium 3.3 (*)    Chloride 95 (*)    Glucose, Bld 248 (*)    Calcium, Ion 1.06 (*)    All other components within normal limits  CG4 I-STAT (LACTIC ACID) - Abnormal; Notable for the following:    Lactic Acid, Venous >20.00 (*)    All other components within normal limits  LIPASE, BLOOD  ETHANOL  URINE RAPID DRUG SCREEN (HOSP PERFORMED)  AMMONIA  ACETAMINOPHEN LEVEL  HIV ANTIBODY (ROUTINE TESTING)  VITAMIN B12  GLUCOSE, CAPILLARY  AFP TUMOR MARKER  IRON AND TIBC  HEPATITIS PANEL, ACUTE  GLUCOSE, CAPILLARY  LEVETIRACETAM LEVEL  LEVETIRACETAM LEVEL  ANA  MITOCHONDRIAL ANTIBODIES  ANTI-SMOOTH MUSCLE ANTIBODY, IGG    Imaging Review US Abdomen Complete  11/03/2013   CLINICAL DATA:  Elevated transaminase levels.  EXAM: ULTRASOUND ABDOMEN COMPLETE  COMPARISON:  CT scan of June 23, 2013.  FINDINGS: Gallbladder:  No gallstones or wall thickening visualized. No sonographic Murphy sign noted.  Common bile duct:  Diameter: Measures 2.8 mm which is within normal limits.  Liver:  No focal lesion identified. Hepatic parenchyma is echogenic suggesting fatty infiltration or diffuse hepatocellular disease.  IVC:  No abnormality visualized.  Pancreas:  Visualized portion unremarkable.  Spleen:  Size and appearance within normal limits.  Right Kidney:  Length: 11.2 cm. Echogenicity within normal limits. No mass or hydronephrosis visualized.  Left Kidney:  Length: 12.1 cm. Echogenicity within normal limits. No mass or  hydronephrosis visualized.  Abdominal aorta:  No aneurysm visualized.  Other findings:  None.  IMPRESSION: Increased echogenicity of hepatic parenchyma is noted suggesting diffuse hepatocellular disease or fatty infiltration. No other abnormality seen in the abdomen.   Electronically Signed   By: Roque Lias M.D.   On: 11/03/2013 08:17     EKG Interpretation None      MDM   Final diagnoses:  Encephalopathy, hepatic  Gastroesophageal reflux disease without esophagitis  Hypokalemia  Seizure disorder  Acute encephalopathy  Thrombocytopenia  Transaminitis  Status epilepticus    Pt presenting after 2 seizures, hx of seizure disorder on keppra.  Normal neuro exam, pt sluggish to return to his baseline, pt with transaminitis above his baseline- hx of alcoholic liver disease- ammonia also elevated. Pt started on lactulose.  Admitted to step down bed to triad service for further evaluation and management.     Ethelda Chick, MD 11/04/13 1046

## 2013-11-02 ENCOUNTER — Inpatient Hospital Stay (HOSPITAL_COMMUNITY): Payer: BC Managed Care – PPO

## 2013-11-02 DIAGNOSIS — R740 Nonspecific elevation of levels of transaminase and lactic acid dehydrogenase [LDH]: Secondary | ICD-10-CM

## 2013-11-02 DIAGNOSIS — G9340 Encephalopathy, unspecified: Secondary | ICD-10-CM

## 2013-11-02 DIAGNOSIS — G40901 Epilepsy, unspecified, not intractable, with status epilepticus: Secondary | ICD-10-CM | POA: Diagnosis present

## 2013-11-02 DIAGNOSIS — R7402 Elevation of levels of lactic acid dehydrogenase (LDH): Secondary | ICD-10-CM

## 2013-11-02 DIAGNOSIS — D696 Thrombocytopenia, unspecified: Secondary | ICD-10-CM

## 2013-11-02 LAB — GLUCOSE, CAPILLARY
GLUCOSE-CAPILLARY: 138 mg/dL — AB (ref 70–99)
GLUCOSE-CAPILLARY: 91 mg/dL (ref 70–99)
Glucose-Capillary: 115 mg/dL — ABNORMAL HIGH (ref 70–99)
Glucose-Capillary: 121 mg/dL — ABNORMAL HIGH (ref 70–99)

## 2013-11-02 LAB — CBC
HCT: 29.7 % — ABNORMAL LOW (ref 39.0–52.0)
HEMOGLOBIN: 10.8 g/dL — AB (ref 13.0–17.0)
MCH: 36.6 pg — ABNORMAL HIGH (ref 26.0–34.0)
MCHC: 36.4 g/dL — AB (ref 30.0–36.0)
MCV: 100.7 fL — ABNORMAL HIGH (ref 78.0–100.0)
Platelets: 44 10*3/uL — ABNORMAL LOW (ref 150–400)
RBC: 2.95 MIL/uL — ABNORMAL LOW (ref 4.22–5.81)
RDW: 14.5 % (ref 11.5–15.5)
WBC: 4.2 10*3/uL (ref 4.0–10.5)

## 2013-11-02 LAB — COMPREHENSIVE METABOLIC PANEL
ALBUMIN: 3.5 g/dL (ref 3.5–5.2)
ALT: 65 U/L — AB (ref 0–53)
AST: 348 U/L — ABNORMAL HIGH (ref 0–37)
Alkaline Phosphatase: 115 U/L (ref 39–117)
Anion gap: 12 (ref 5–15)
BUN: 5 mg/dL — ABNORMAL LOW (ref 6–23)
CALCIUM: 8.9 mg/dL (ref 8.4–10.5)
CO2: 28 mEq/L (ref 19–32)
Chloride: 99 mEq/L (ref 96–112)
Creatinine, Ser: 0.71 mg/dL (ref 0.50–1.35)
GFR calc non Af Amer: >90 mL/min (ref >90)
GLUCOSE: 105 mg/dL — AB (ref 70–99)
Potassium: 3 mEq/L — ABNORMAL LOW (ref 3.7–5.3)
SODIUM: 139 meq/L (ref 137–147)
TOTAL PROTEIN: 6.5 g/dL (ref 6.0–8.3)
Total Bilirubin: 3.4 mg/dL — ABNORMAL HIGH (ref 0.3–1.2)

## 2013-11-02 LAB — PROTIME-INR
INR: 1.37 (ref 0.00–1.49)
Prothrombin Time: 16.9 seconds — ABNORMAL HIGH (ref 11.6–15.2)

## 2013-11-02 LAB — AMMONIA: Ammonia: 55 umol/L (ref 11–60)

## 2013-11-02 LAB — HEMOGLOBIN A1C
HEMOGLOBIN A1C: 5.8 % — AB (ref <5.7)
Mean Plasma Glucose: 120 mg/dL — ABNORMAL HIGH (ref <117)

## 2013-11-02 LAB — ACETAMINOPHEN LEVEL

## 2013-11-02 LAB — VITAMIN B12: VITAMIN B 12: 783 pg/mL (ref 211–911)

## 2013-11-02 LAB — CG4 I-STAT (LACTIC ACID): Lactic Acid, Venous: >20 mmol/L — ABNORMAL HIGH (ref 0.5–2.2)

## 2013-11-02 MED ORDER — LEVETIRACETAM 750 MG PO TABS
1500.0000 mg | ORAL_TABLET | Freq: Two times a day (BID) | ORAL | Status: DC
  Administered 2013-11-02 – 2013-11-03 (×3): 1500 mg via ORAL
  Filled 2013-11-02: qty 2
  Filled 2013-11-02: qty 3
  Filled 2013-11-02 (×2): qty 2

## 2013-11-02 MED ORDER — POTASSIUM CHLORIDE CRYS ER 20 MEQ PO TBCR: 40.0000 meq | EXTENDED_RELEASE_TABLET | Freq: Once | ORAL | Status: DC

## 2013-11-02 MED ORDER — LEVETIRACETAM 1000 MG PO TABS: 1500.0000 mg | ORAL_TABLET | Freq: Two times a day (BID) | ORAL | Status: DC

## 2013-11-02 NOTE — Progress Notes (Addendum)
[facility] PROGRESS NOTE  Eric Mueller UJW:[id number] DOB: 1985/08/20 DOA: 11/01/2013 PCP: Lucilla EdinAUB, STEVE A, MD  Assessment/Plan: Status epilepticus Patient has unclear cause of breakthrough seizure despite being compliant with his keppra. unlcear if pt is having resistance to keppra. -Monitored in step down and encephalopathy resolved. -Spoke with Dr Tera HelperBoggs who is covering for his neurologist Dr In[facility] and recommend to increase dose of keppra to 1500 mg bid. Will need to monitor his LFTs closely. -he is recommend strongly to abstain from operating any vehicle or heavy machinery for at least 6 months until he is seizure free and until cleared by his neurologist as outpatient.  Transaminitis with thrombocytopenia Patient has underlying alcoholic hepatitis, with moderate transaminitis and mild thrombocytopenia as per recent lab 6 weeks back. She has unexplained elevation of transaminases and severe thrombocytopenia. Hepatitis panel checked 6 weeks back was normal. etoh level negative on admission, utox was negative as well. Abdominal exam is benign. Patient reports he has not been drinking for past 8 months . We'll check abdominal ultrasound to evaluate the liver and gallbladder. Check Tylenol level and INR. Avoid hepatotoxic agents. Keppra dose may need to be adjusted if transaminitis persist. Will monitor LFTs in am.   Anemia and thrombocytopenia ?Possibly related to worsening liver disease. Check HIV.  monitor in am  ? Hepatic encephalopathy Mental Status clear. Had elevated ammonia on presentation. Does not need further lactulose.  Hypokalemia Replenish with KCl  Code Status: Full code Family Communication: spoke with father Disposition Plan: home possibly in am if stable and LFTs improve   Consultants:  Spoke with neurologist Dr Tera HelperBoggs at [facility] over the phone  Procedures:  none  Antibiotics:  none  HPI/Subjective: Discontinue exam this morning.  Denies any symptoms  Objective: Filed Vitals:   11/02/13 1200  BP: 127/86  Pulse: 103  Temp: 98.3 F (36.8 C)  Resp: 28    Intake/Output Summary (Last 24 hours) at 11/02/13 1502 Last data filed at 11/02/13 1200  Gross per 24 hour  Intake   1509 ml  Output   1150 ml  Net    359 ml   Filed Weights   11/01/13 2109  Weight: 74.254 kg (163 lb 11.2 oz)    Exam:   General:  Young male in no acute distress  HEENT: No pallor, moist oral mucosa, no icterus  Cardiovascular: Normal S1 and S2, no murmurs rub or gallop  Chest: Clear to auscultation bilaterally, no added sounds  Abdomen: Soft, nontender, nondistended, bowel present Murphy's sign negative  Extremities: Warm, no edema  CNS: AAO x3  Data Reviewed: Basic Metabolic Panel:  Recent Labs Lab 11/01/13 1643 11/02/13 0630  NA 138 139  K 3.3* 3.0*  CL 95* 99  CO2  --  28  GLUCOSE 248* 105*  BUN 6 5*  CREATININE 1.00 0.71  CALCIUM  --  8.9   Liver Function Tests:  Recent Labs Lab 11/01/13 1659 11/02/13 0630  AST 315* 348*  ALT 69* 65*  ALKPHOS 165* 115  BILITOT 2.0* 3.4*  PROT 7.9 6.5  ALBUMIN 4.6 3.5    Recent Labs Lab 11/01/13 1659  LIPASE 28    Recent Labs Lab 11/01/13 1821 11/02/13 0630  AMMONIA 235* 55   CBC:  Recent Labs Lab 11/01/13 1643 11/01/13 2017 11/02/13 0630  WBC  --  7.9 4.2  HGB 14.6 10.7* 10.8*  HCT 43.0 29.3* 29.7*  MCV  --  99.7 100.7*  PLT  --  54* 44*   Cardiac Enzymes: No results found for this basename: CKTOTAL, CKMB, CKMBINDEX, TROPONINI,  in the last 168 hours BNP (last 3 results)  Recent Labs  01/16/13 1230 01/19/13 0350  PROBNP 282.4* 41.7   CBG:  Recent Labs Lab 11/01/13 2207 11/02/13 0834 11/02/13 1209  GLUCAP 108* 115* 138*    Recent Results (from the past 240 hour(s))  MRSA PCR SCREENING     Status: Abnormal   Collection Time    11/01/13  9:23 PM      Result Value Ref Range Status   MRSA by PCR POSITIVE (*) NEGATIVE Final    Comment:            The GeneXpert MRSA Assay (FDA     approved for NASAL specimens     only), is one component of a     comprehensive MRSA colonization     surveillance program. It is not     intended to diagnose MRSA     infection nor to guide or     monitor treatment for     MRSA infections.     RESULT CALLED TO, READ BACK BY AND VERIFIED WITH:     KGARCIA RN AT 2325 ON [id number] BY DLONG     Studies: No results found.  Scheduled Meds: . Chlorhexidine Gluconate Cloth  6 each Topical Q0600  . folic acid  1 mg Intravenous Daily  . insulin aspart  0-5 Units Subcutaneous QHS  . insulin aspart  0-9 Units Subcutaneous TID WC  . lactulose  45 g Oral QID  . levETIRAcetam  1,500 mg Oral BID  . multivitamin with minerals  1 tablet Oral Daily  . mupirocin ointment  1 application Nasal BID  . potassium chloride  20 mEq Oral TID  . sodium chloride  3 mL Intravenous Q12H  . thiamine  100 mg Intravenous Daily   Continuous Infusions:    Time spent: 25 minutes    Alpheus Stiff  [facility] Pager [phone] If 7PM-7AM, please contact night-coverage at [email or web address], password [email or web address] 11/02/2013, 3:02 PM  LOS: 1 day

## 2013-11-02 NOTE — Discharge Instructions (Addendum)
Driving and Equipment Restrictions °Some medical problems make it dangerous to drive, ride a bike, or use machines. Some of these problems are: °· A hard blow to the head (concussion). °· Passing out (fainting). °· Twitching and shaking (seizures). °· Low blood sugar. °· Taking medicine to help you relax (sedatives). °· Taking pain medicines. °· Wearing an eye patch. °· Wearing splints. This can make it hard to use parts of your body that you need to drive safely. °HOME CARE  °· Do not drive until your doctor says it is okay. °· Do not use machines until your doctor says it is okay. °You may need a form signed by your doctor (medical release) before you can drive again. You may also need this form before you do other tasks where you need to be fully alert. °MAKE SURE YOU: °· Understand these instructions. °· Will watch your condition. °· Will get help right away if you are not doing well or get worse. °Document Released: 05/24/2004 Document Revised: 07/09/2011 Document Reviewed: 08/24/2009 °[employer]® Patient Information ©2015 [employer]. This information is not intended to replace advice given to you by your health care provider. Make sure you discuss any questions you have with your health care provider. ° °

## 2013-11-03 DIAGNOSIS — G40401 Other generalized epilepsy and epileptic syndromes, not intractable, with status epilepticus: Principal | ICD-10-CM

## 2013-11-03 LAB — IRON AND TIBC
Iron: 59 ug/dL (ref 42–135)
SATURATION RATIOS: 26 % (ref 20–55)
TIBC: 229 ug/dL (ref 215–435)
UIBC: 170 ug/dL (ref 125–400)

## 2013-11-03 LAB — GLUCOSE, CAPILLARY
GLUCOSE-CAPILLARY: 101 mg/dL — AB (ref 70–99)
GLUCOSE-CAPILLARY: 106 mg/dL — AB (ref 70–99)
GLUCOSE-CAPILLARY: 99 mg/dL (ref 70–99)

## 2013-11-03 LAB — COMPREHENSIVE METABOLIC PANEL
ALBUMIN: 3.6 g/dL (ref 3.5–5.2)
ALK PHOS: 119 U/L — AB (ref 39–117)
ALT: 64 U/L — ABNORMAL HIGH (ref 0–53)
ANION GAP: 13 (ref 5–15)
AST: 252 U/L — ABNORMAL HIGH (ref 0–37)
BUN: 9 mg/dL (ref 6–23)
CO2: 28 mEq/L (ref 19–32)
CREATININE: 0.79 mg/dL (ref 0.50–1.35)
Calcium: 9.5 mg/dL (ref 8.4–10.5)
Chloride: 99 mEq/L (ref 96–112)
GFR calc non Af Amer: >90 mL/min (ref >90)
GLUCOSE: 101 mg/dL — AB (ref 70–99)
POTASSIUM: 4 meq/L (ref 3.7–5.3)
Sodium: 140 mEq/L (ref 137–147)
TOTAL PROTEIN: 6.8 g/dL (ref 6.0–8.3)
Total Bilirubin: 3 mg/dL — ABNORMAL HIGH (ref 0.3–1.2)

## 2013-11-03 LAB — CBC
HEMATOCRIT: 30.3 % — AB (ref 39.0–52.0)
Hemoglobin: 10.6 g/dL — ABNORMAL LOW (ref 13.0–17.0)
MCH: 35.9 pg — ABNORMAL HIGH (ref 26.0–34.0)
MCHC: 35 g/dL (ref 30.0–36.0)
MCV: 102.7 fL — ABNORMAL HIGH (ref 78.0–100.0)
Platelets: 50 10*3/uL — ABNORMAL LOW (ref 150–400)
RBC: 2.95 MIL/uL — ABNORMAL LOW (ref 4.22–5.81)
RDW: 14.6 % (ref 11.5–15.5)
WBC: 5.5 10*3/uL (ref 4.0–10.5)

## 2013-11-03 LAB — HIV ANTIBODY (ROUTINE TESTING W REFLEX): HIV: NONREACTIVE

## 2013-11-03 LAB — HEPATITIS PANEL, ACUTE
HCV AB: NEGATIVE
HEP B C IGM: NONREACTIVE
Hep A IgM: NONREACTIVE
Hepatitis B Surface Ag: NEGATIVE

## 2013-11-03 LAB — AFP TUMOR MARKER: AFP-Tumor Marker: 6.2 ng/mL (ref 0.0–8.0)

## 2013-11-03 MED ORDER — VITAMIN B-1 100 MG PO TABS
100.0000 mg | ORAL_TABLET | Freq: Every day | ORAL | Status: DC
  Administered 2013-11-03: 100 mg via ORAL
  Filled 2013-11-03: qty 1

## 2013-11-03 MED ORDER — FOLIC ACID 1 MG PO TABS
1.0000 mg | ORAL_TABLET | Freq: Every day | ORAL | Status: DC
  Administered 2013-11-03: 1 mg via ORAL
  Filled 2013-11-03: qty 1

## 2013-11-03 NOTE — Discharge Summary (Signed)
Physician Discharge Summary  Eric Mueller UEA:[id number] DOB: [birth year] DOA: 11/01/2013  PCP: Lucilla EdinAUB, STEVE A, MD  Admit date: 11/01/2013 Discharge date: 11/03/2013  Time spent:40  minutes  Recommendations for Outpatient Follow-up:  1. Discharge home with outpt appt with Dr Elnoria HowardHung on 11/09/2013. Needs repeat hb , platelets and LFTs checked and follow hepatitis panel and labs sent this admission. 2. Follow up with neurology at [facility] in 1-2 weeks. please call by end of the week to confirm appt 3. Patient is strongly recommended not to drive any vehicle or operate machinery , swim or babysit  for at least 6 months of seizure-free interval or heart disease cleared by his neurologist as outpatient.  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 [facility] and received a dose of IV Ativan after which his mental status improved. He was also found to have elevated ammonia. Patient continued on IV Keppra and admitted to step down for closer monitoring.  Hospital Course:  Status epilepticus  Patient has unclear cause of breakthrough seizure despite being compliant with his keppra. Unlcear if pt is having resistance to keppra.  -Monitored in step down and encephalopathy resolved. No further seizure activity  and clinically stable. -Spoke with Dr Tera HelperBoggs who is covering for his neurologist Dr Inez Catalina'donovan at [facility] and recommend to increase dose of keppra to 1500 mg bid. Will need to monitor his LFTs closely. Patient will follow up with his neurologist as outpatient. I have called the office to contact patient to schedule an appointment with in 2 weeks. -he is recommend strongly to abstain from operating any vehicle or heavy machinery for at least 6 months until he is seizure free and until cleared by his neurologist as outpatient.    Transaminitis with thrombocytopenia  Patient has underlying alcoholic hepatitis, with moderate transaminitis and mild thrombocytopenia as per recent lab 6 weeks back. He now has unexplained elevation of transaminases and severe thrombocytopenia and mild anemia. Hepatitis panel checked 6 weeks back was normal. etoh level negative on admission, utox was negative as well. Abdominal exam is benign. Patient reports he has not been drinking for past 8-9 months . Abdominal ultrasound shows diffuse hepatocellular disease versus fatty infiltration. INR of 1.57. Tylenol level negative..   On admission patient had AST of 348, ALT of 65 and total bili of 3.4 and alkaline phosphatase of 115. This morning his AST is 252, ALT 64, alkaline phosphatase of 119 and total bili of 3. His PTT is 50 and his hemoglobin of 10.6. Recent hepatitis panel was negative. Unclear if this is decompensated liver disease versus recent acute viral illness. Discussed these findings with Dr. Loreta AveMann who is calling for his GI Dr Elnoria HowardHung and recommended that he could be discharged clinically stable and since he has an appointment  with Dr. Elnoria Howard within a week he will be followed as outpatient. I have sent a repeat acute hepatitis panel, ANA, antimitochondrial antibody and AFP which should be followed as outpatient.  Anemia and thrombocytopenia  ?Possibly related to worsening liver disease. HIV negative. B12 was normal. Iron  panel was normal. Needs close followup as outpatient.  ? Hepatic encephalopathy  Mental Status clear. Had elevated ammonia on presentation. Does not need further lactulose.   Hypokalemia  Replenish with KCl    Family Communication: spoke with father  Disposition Plan: Home with outpatient GI and neurology followup  Consultants:  Spoke with neurologist Dr Tera Helper at [facility] over the phone Spoke with Dr. Loreta Ave over the phone  Procedures:  None   Antibiotics:  none     Discharge Exam: Filed Vitals:   11/03/13 0505  BP: 121/90  Pulse: 87  Temp: 98 F (36.7 C)  Resp: 18    General: Young male in no acute distress  HEENT: No pallor, moist oral mucosa, no icterus  Cardiovascular: Normal S1 and S2, no murmurs rub or gallop  Chest: Clear to auscultation bilaterally, no added sounds Abdomen: Soft, nontender, nondistended, bowel present Murphy's sign negative  Extremities: Warm, no edema, no petechiae or rash  CNS: AAO x3   Discharge Instructions You were cared for by a hospitalist during your hospital stay. If you have any questions about your discharge medications or the care you received while you were in the hospital after you are discharged, you can call the unit and asked to speak with the hospitalist on call if the hospitalist that took care of you is not available. Once you are discharged, your primary care physician will handle any further medical issues. Please note that NO REFILLS for any discharge medications will be authorized once you are discharged, as it is imperative that you return to your primary care physician (or establish a relationship with a primary care physician if you do not have one) for your aftercare needs so that they can reassess your need for medications and monitor your lab values.     Medication List         levETIRAcetam 1000 MG tablet  Commonly known as:  KEPPRA  Take 1.5 tablets (1,500 mg total) by mouth 2 (two) times daily.      MULTI-VITAMINS Tabs  Take 1 tablet by mouth daily.     potassium chloride SA 20 MEQ tablet  Commonly known as:  K-DUR,KLOR-CON  Take 1 tablet (20 mEq total) by mouth 2 (two) times daily.       Allergies  Allergen Reactions  . Oxycodone Nausea And Vomiting       Follow-up Information   Follow up with Barrington Ellison, MD. Schedule an appointment as soon as possible for a visit in 1 week. (please call the office to make appt within next 2-3 days)    Specialty:  Psychiatry   Contact information:   [facility] [place] [address] [phone]       Follow up with Theda Belfast, MD On 11/09/2013.   Specialty:  Gastroenterology   Contact information:   [address] [place] [address] [phone]        The results of significant diagnostics from this hospitalization (including imaging, microbiology, ancillary and laboratory) are listed below for reference.    Significant Diagnostic Studies: US Abdomen Complete  11/03/2013   CLINICAL DATA:  Elevated transaminase levels.  EXAM: ULTRASOUND ABDOMEN COMPLETE  COMPARISON:  CT  scan of June 23, 2013.  FINDINGS: Gallbladder:  No gallstones or wall thickening visualized. No sonographic Murphy sign noted.  Common bile duct:  Diameter: Measures 2.8 mm which is within normal limits.  Liver:  No focal lesion identified. Hepatic parenchyma is echogenic suggesting fatty infiltration or diffuse hepatocellular disease.  IVC:  No abnormality visualized.  Pancreas:  Visualized portion unremarkable.  Spleen:  Size and appearance within normal limits.  Right Kidney:  Length: 11.2 cm. Echogenicity within normal limits. No mass or hydronephrosis visualized.  Left Kidney:  Length: 12.1 cm. Echogenicity within normal limits. No mass or hydronephrosis visualized.  Abdominal aorta:  No aneurysm visualized.  Other findings:  None.  IMPRESSION: Increased echogenicity of hepatic parenchyma is noted suggesting diffuse  hepatocellular disease or fatty infiltration. No other abnormality seen in the abdomen.   Electronically Signed   By: Roque LiasJames  Green M.D.   On: 11/03/2013 08:17    Microbiology: Recent Results (from the past 240 hour(s))  MRSA PCR SCREENING     Status: Abnormal   Collection Time    11/01/13  9:23 PM      Result Value Ref Range Status   MRSA by PCR POSITIVE (*) NEGATIVE Final   Comment:            The GeneXpert MRSA Assay (FDA     approved for NASAL specimens     only), is one component of a     comprehensive MRSA colonization     surveillance program. It is not     intended to diagnose MRSA     infection nor to guide or     monitor treatment for     MRSA infections.     RESULT CALLED TO, READ BACK BY AND VERIFIED WITH:     KGARCIA RN AT 2325 ON [id number] BY DLONG     Labs: Basic Metabolic Panel:  Recent Labs Lab 11/01/13 1643 11/02/13 0630 11/03/13 0415  NA 138 139 140  K 3.3* 3.0* 4.0  CL 95* 99 99  CO2  --  28 28  GLUCOSE 248* 105* 101*  BUN 6 5* 9  CREATININE 1.00 0.71 0.79  CALCIUM  --  8.9 9.5   Liver Function Tests:  Recent Labs Lab 11/01/13 1659 11/02/13 0630 11/03/13 0415  AST 315* 348* 252*  ALT 69* 65* 64*  ALKPHOS 165* 115 119*  BILITOT 2.0* 3.4* 3.0*  PROT 7.9 6.5 6.8  ALBUMIN 4.6 3.5 3.6    Recent Labs Lab 11/01/13 1659  LIPASE 28    Recent Labs Lab 11/01/13 1821 11/02/13 0630  AMMONIA 235* 55   CBC:  Recent Labs Lab 11/01/13 1643 11/01/13 2017 11/02/13 0630 11/03/13 0415  WBC  --  7.9 4.2 5.5  HGB 14.6 10.7* 10.8* 10.6*  HCT 43.0 29.3* 29.7* 30.3*  MCV  --  99.7 100.7* 102.7*  PLT  --  54* 44* 50*   Cardiac Enzymes: No results found for this basename: CKTOTAL, CKMB, CKMBINDEX, TROPONINI,  in the last 168 hours BNP: BNP (last 3 results)  Recent Labs  01/16/13 1230 01/19/13 0350  PROBNP 282.4* 41.7   CBG:  Recent Labs Lab 11/02/13 1209 11/02/13 1721 11/02/13 2117 11/03/13 0739 11/03/13 1128  GLUCAP 138*  121* 91 101* 106*       Signed:  Berl Bonfanti  [facility] 11/03/2013, 1:29 PM

## 2013-11-03 NOTE — Progress Notes (Signed)
This patient is receiving folic acid, thiamine. Based on criteria approved by the Pharmacy and Therapeutics Committee, this medication is being converted to the equivalent oral dose form. These criteria include:   . The patient is eating (either orally or per tube) and/or has been taking other orally administered medications for at least 24 hours.  . This patient has no evidence of active gastrointestinal bleeding or impaired GI absorption (gastrectomy, short bowel, patient on TNA or NPO).   If you have questions about this conversion, please contact the pharmacy department.  Earl ManyLegge, Xaine Sansom CoatsMarshall, [facility]PH 11/03/2013 7:09 AM

## 2013-11-03 NOTE — Progress Notes (Signed)
Patient d/c home. Stable.- Kemani Heidel RN 

## 2013-11-03 NOTE — Progress Notes (Signed)
Patient's d/c instructions given,teach back utilized.,verbalized understanding. Patient is stable.Alert and orientedx4.- Eric Marinonna Jenniah Bhavsar RN

## 2013-11-04 LAB — LEVETIRACETAM LEVEL
LEVETIRACETAM: 15.8 ug/mL
LEVETIRACETAM: 11.2 ug/mL

## 2013-11-04 LAB — MITOCHONDRIAL ANTIBODIES: MITOCHONDRIAL M2 AB, IGG: 0.43 (ref <0.91)

## 2013-11-04 LAB — ANA: Anti Nuclear Antibody(ANA): NEGATIVE

## 2013-11-04 LAB — ANTI-SMOOTH MUSCLE ANTIBODY, IGG: F-ACTIN AB IGG: 7 U (ref <20)

## 2014-01-11 IMAGING — US US ART/VEN ABD/PELV/SCROTUM DOPPLER LTD
1 series · 14 of 25 positions shown · non-contrast
Comparison: None.

CLINICAL DATA: Alcohol abuse.  Acute liver injury.

EXAM:
DUPLEX ULTRASOUND OF LIVER
TECHNIQUE: Color and duplex Doppler ultrasound was performed to evaluate the
hepatic in-flow and out-flow vessels.

[Series 1: us art/ven abd/pelv/scrotum doppler ltd · 0.29mm/px · 14 of 67 slices shown]
[im 1/67]
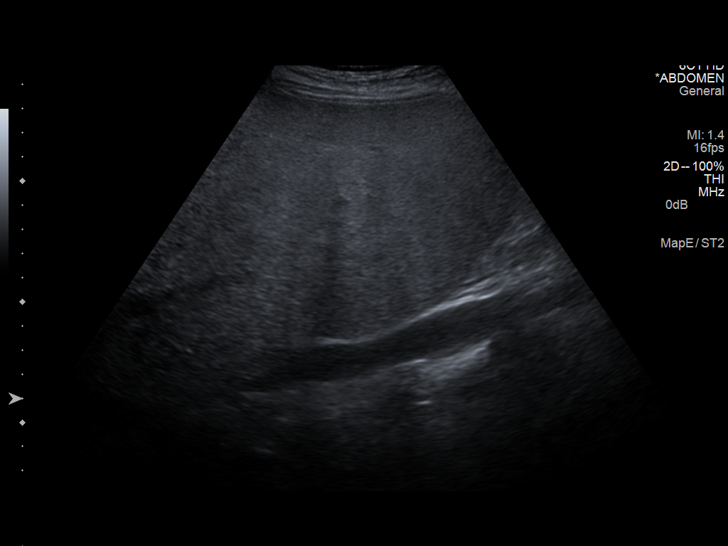
[im 6/67]
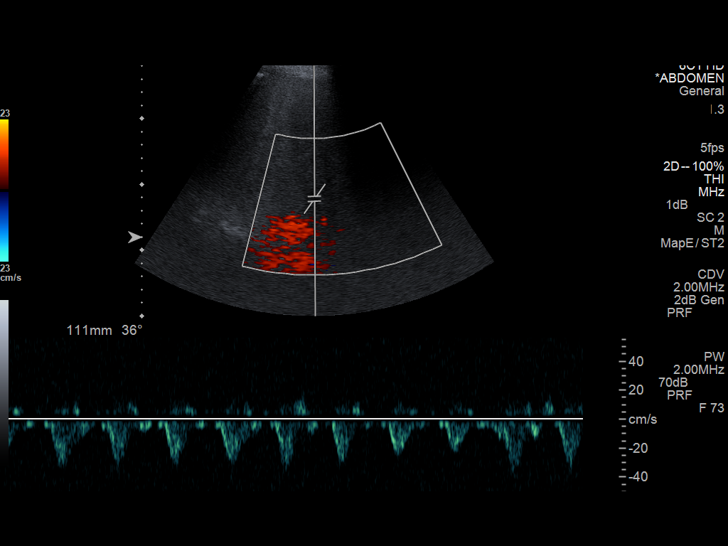
[im 12/67]
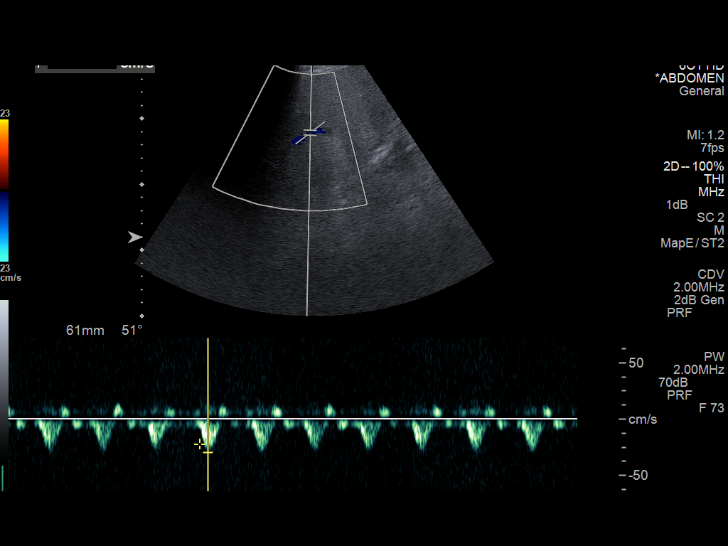
[im 17/67]
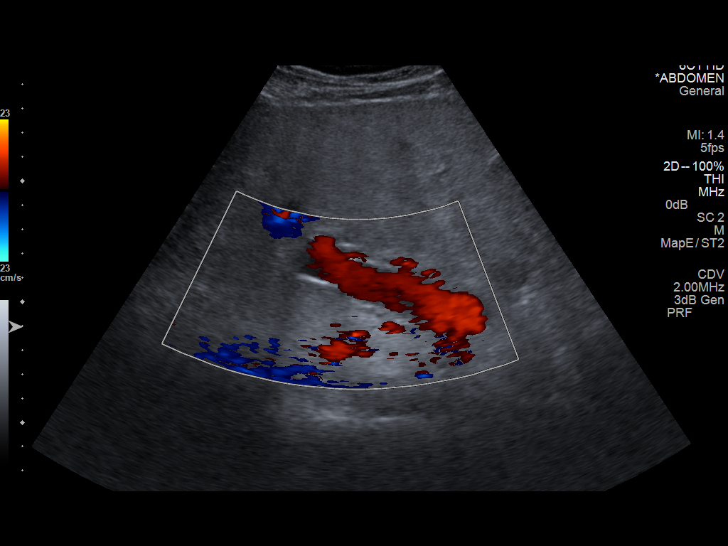
[im 23/67]
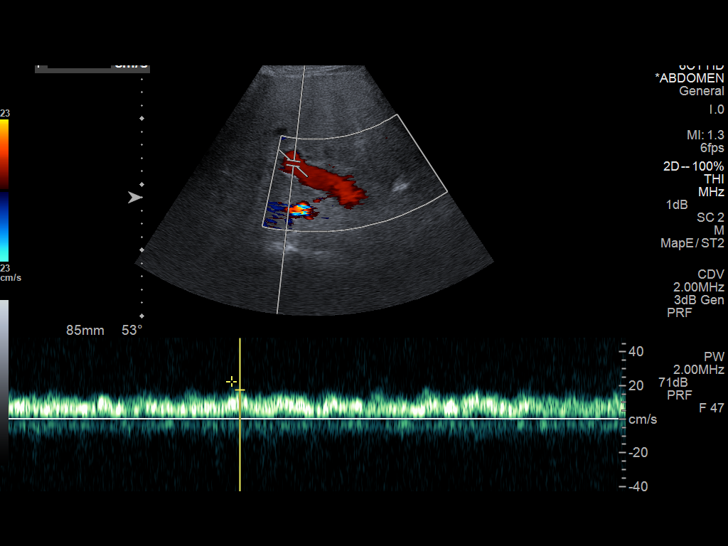
[im 25/67]
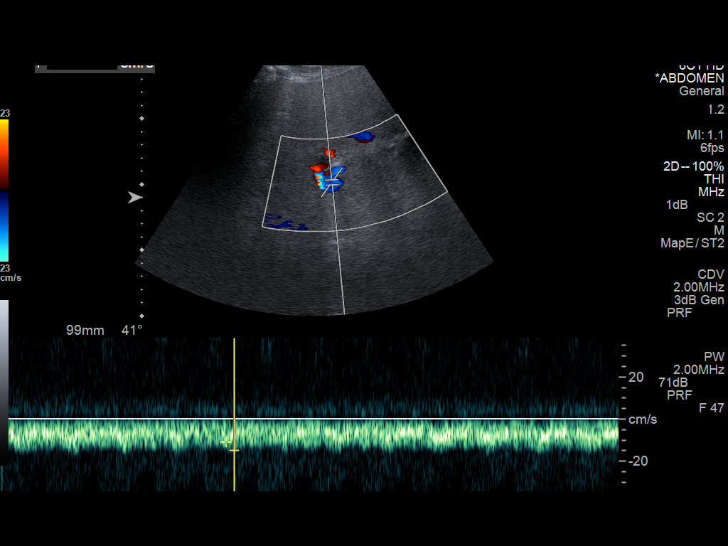
[im 31/67]
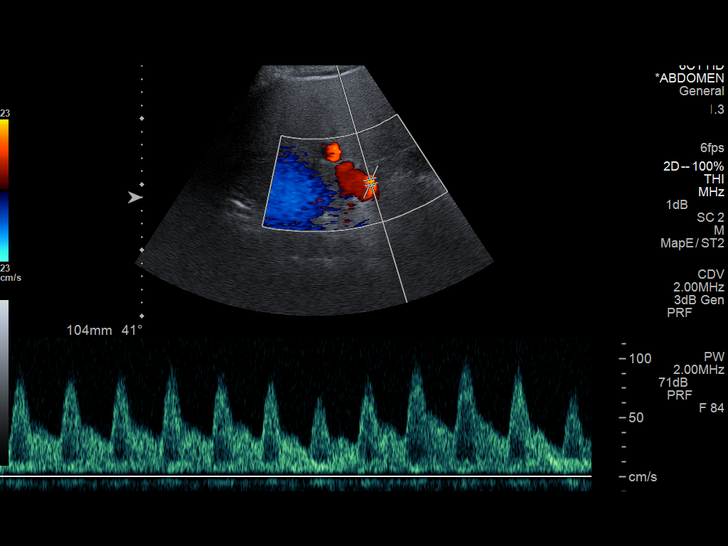
[im 36/67]
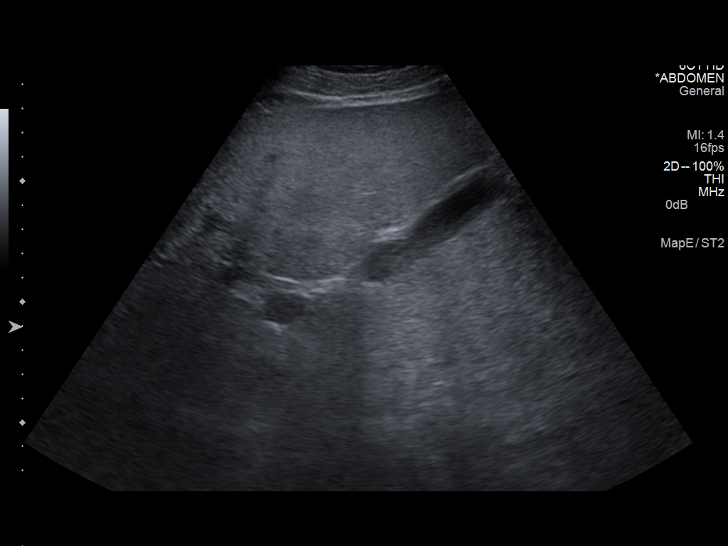
[im 42/67]
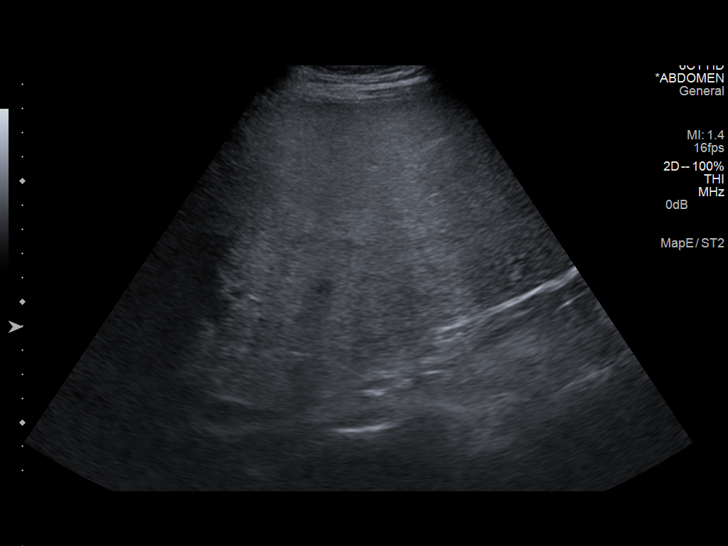
[im 45/67]
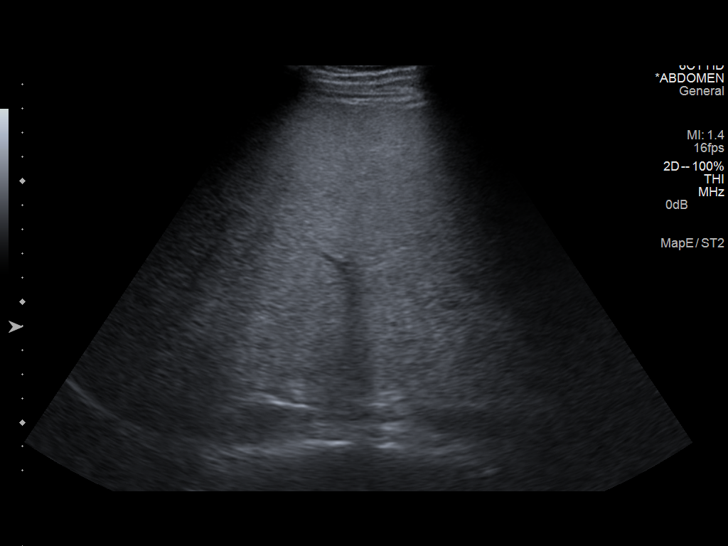
[im 50/67]
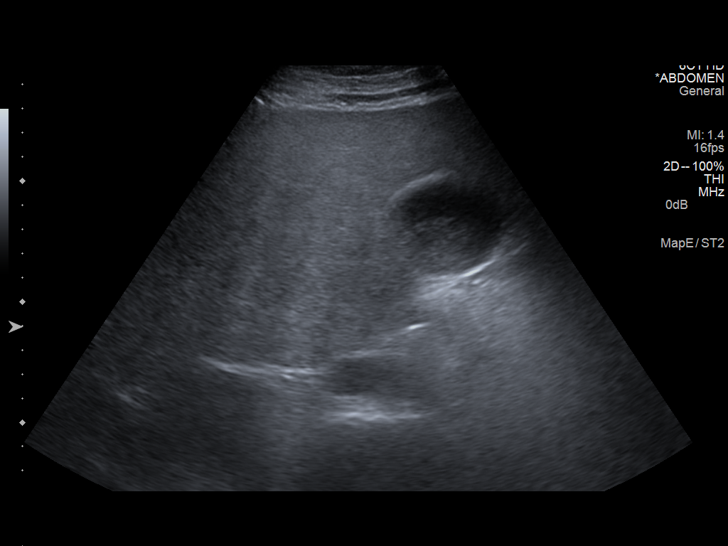
[im 56/67]
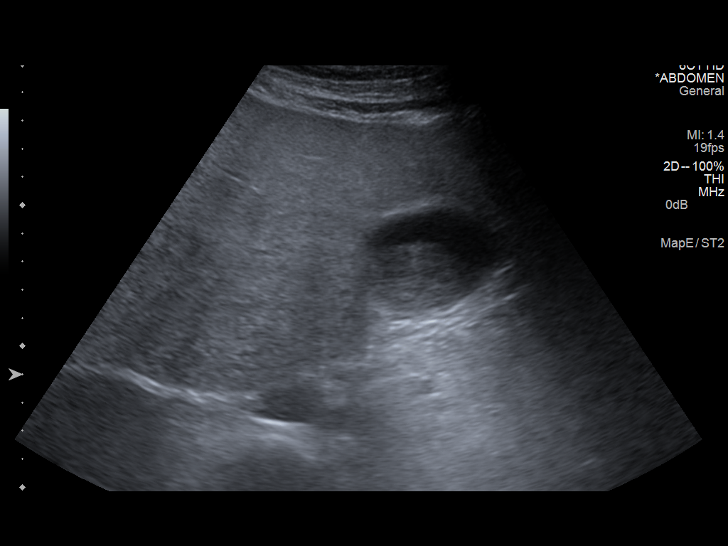
[im 61/67]
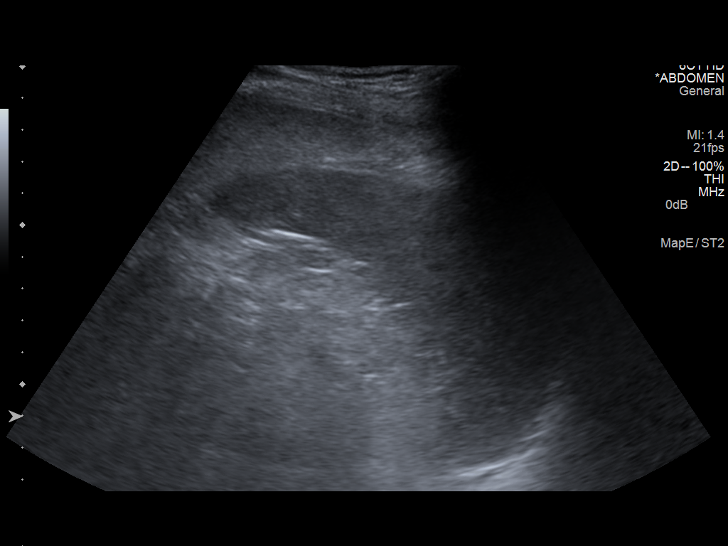
[im 67/67]
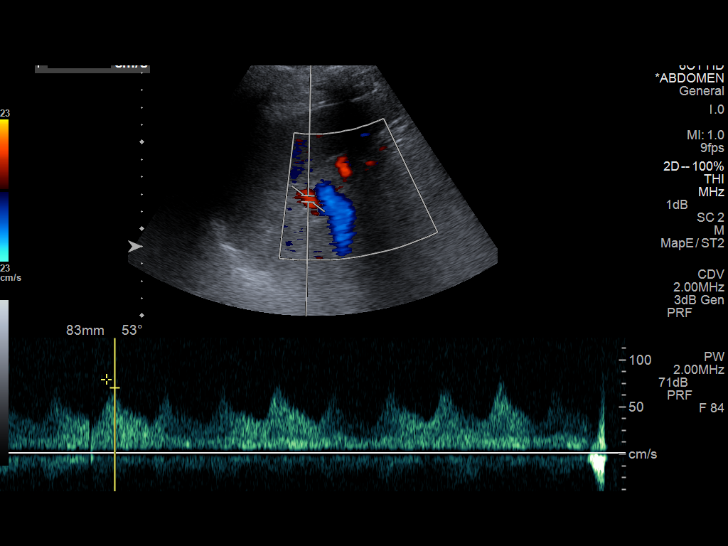

[14 of 25 positions shown; findings below may reference images not displayed]

FINDINGS: Portal Vein Velocities

Main:  27.7 cm/sec

Right:  14.8 cm/sec

Left:  11.1 cm/sec

Hepatic Vein Velocities

Right:  40.5 cm/sec

Middle:  31.5 cm/sec

Left:  29.7 cm/sec

Hepatic Artery Velocity:  100.6 cm/sec

Splenic Vein Velocity:  38.5. Cm/sec

Varices: Absent.

Ascites: Absent.

Additional findings: Hepatomegaly. Gallbladder sludge is noted.
Portal vein and its branches are hepatopetal and flow. Hepatic veins
are hepatofugal flow. The splenic vein is patent with normal
directionality of flow.
IMPRESSION: Portal and hepatic veins are patent with normal flow.

## 2014-01-23 IMAGING — CR DG CHEST 1V PORT
1 series · 1 of 1 positions shown · non-contrast
Comparison: 01/19/2013 prior chest radiographs dating back to
05/10/2012

CLINICAL DATA: Cough and leukocytosis.

PORTABLE CHEST - 1 VIEW

[AP]
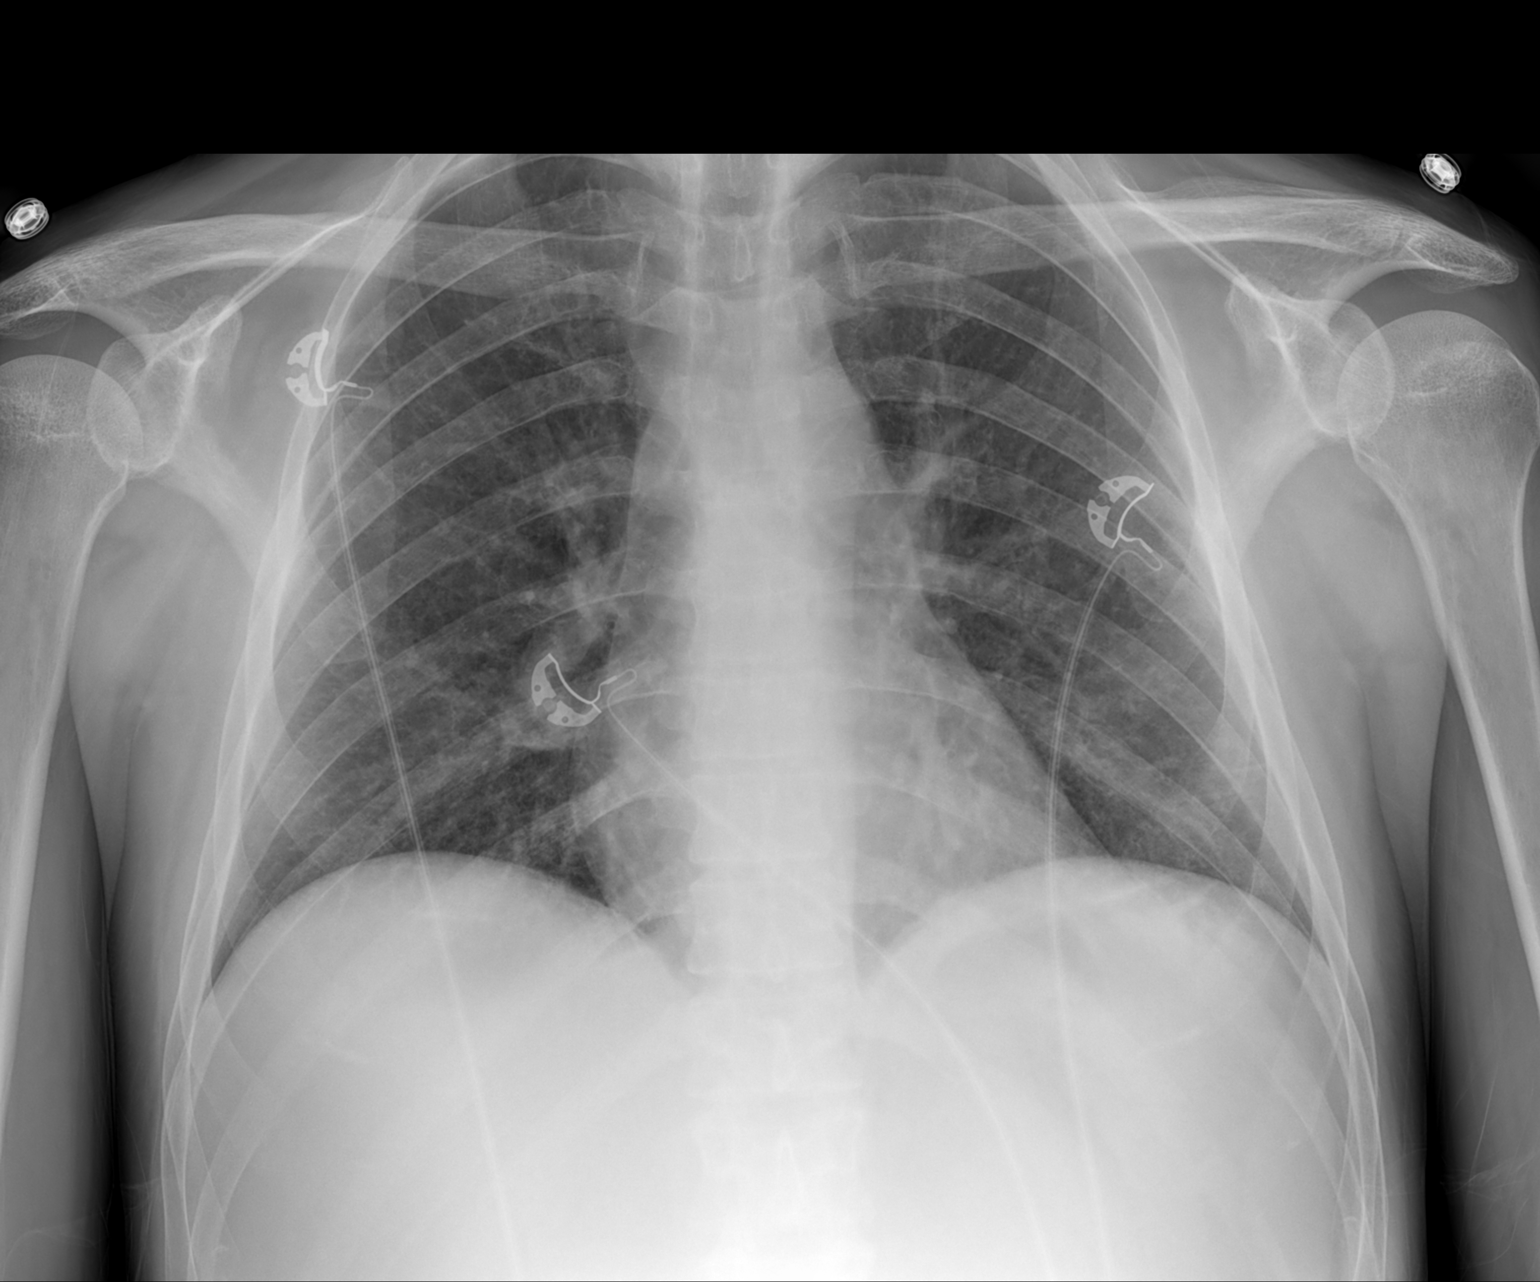

[1 of 1 positions shown; findings below may reference images not displayed]

FINDINGS: The cardiomediastinal silhouette is unremarkable.
Mild peribronchial thickening is again noted.
There is no evidence of focal airspace disease, pulmonary edema,
suspicious pulmonary nodule/mass, pleural effusion, or
pneumothorax.
No acute bony abnormalities are identified.
IMPRESSION: No evidence of active cardiopulmonary disease.

## 2014-02-24 ENCOUNTER — Emergency Department (HOSPITAL_COMMUNITY): Payer: BC Managed Care – PPO

## 2014-02-24 ENCOUNTER — Emergency Department (HOSPITAL_COMMUNITY)
Admission: EM | Admit: 2014-02-24 | Discharge: 2014-02-24 | Disposition: A | Discharge status: Home / Self Care | Payer: BC Managed Care – PPO | Attending: Emergency Medicine | Admitting: Emergency Medicine

## 2014-02-24 ENCOUNTER — Encounter (HOSPITAL_COMMUNITY): Payer: Self-pay | Admitting: Emergency Medicine

## 2014-02-24 DIAGNOSIS — R1110 Vomiting, unspecified: Secondary | ICD-10-CM

## 2014-02-24 DIAGNOSIS — R112 Nausea with vomiting, unspecified: Secondary | ICD-10-CM

## 2014-02-24 DIAGNOSIS — R1084 Generalized abdominal pain: Secondary | ICD-10-CM | POA: Diagnosis present

## 2014-02-24 DIAGNOSIS — Z862 Personal history of diseases of the blood and blood-forming organs and certain disorders involving the immune mechanism: Secondary | ICD-10-CM | POA: Insufficient documentation

## 2014-02-24 DIAGNOSIS — R945 Abnormal results of liver function studies: Secondary | ICD-10-CM | POA: Insufficient documentation

## 2014-02-24 DIAGNOSIS — G40909 Epilepsy, unspecified, not intractable, without status epilepticus: Secondary | ICD-10-CM | POA: Insufficient documentation

## 2014-02-24 DIAGNOSIS — R197 Diarrhea, unspecified: Secondary | ICD-10-CM | POA: Diagnosis not present

## 2014-02-24 DIAGNOSIS — Z8659 Personal history of other mental and behavioral disorders: Secondary | ICD-10-CM | POA: Diagnosis not present

## 2014-02-24 DIAGNOSIS — R7989 Other specified abnormal findings of blood chemistry: Secondary | ICD-10-CM

## 2014-02-24 DIAGNOSIS — Z79899 Other long term (current) drug therapy: Secondary | ICD-10-CM | POA: Insufficient documentation

## 2014-02-24 DIAGNOSIS — K219 Gastro-esophageal reflux disease without esophagitis: Secondary | ICD-10-CM | POA: Insufficient documentation

## 2014-02-24 HISTORY — DX: Acute pancreatitis without necrosis or infection, unspecified: K85.90

## 2014-02-24 HISTORY — DX: Gastro-esophageal reflux disease without esophagitis: K21.9

## 2014-02-24 HISTORY — DX: Elevation of levels of liver transaminase levels: R74.01

## 2014-02-24 HISTORY — DX: Thrombocytopenia, unspecified: D69.6

## 2014-02-24 HISTORY — DX: Alcoholic liver disease, unspecified: K70.9

## 2014-02-24 HISTORY — DX: Nonspecific elevation of levels of transaminase and lactic acid dehydrogenase (ldh): R74.0

## 2014-02-24 LAB — COMPREHENSIVE METABOLIC PANEL
ALK PHOS: 149 U/L — AB (ref 39–117)
ALT: 102 U/L — ABNORMAL HIGH (ref 0–53)
AST: 143 U/L — AB (ref 0–37)
Albumin: 4.8 g/dL (ref 3.5–5.2)
Anion gap: 22 — ABNORMAL HIGH (ref 5–15)
BILIRUBIN TOTAL: 2.5 mg/dL — AB (ref 0.3–1.2)
BUN: 10 mg/dL (ref 6–23)
CHLORIDE: 92 meq/L — AB (ref 96–112)
CO2: 25 meq/L (ref 19–32)
Calcium: 9.7 mg/dL (ref 8.4–10.5)
Creatinine, Ser: 0.78 mg/dL (ref 0.50–1.35)
GFR calc Af Amer: >90 mL/min (ref >90)
GFR calc non Af Amer: >90 mL/min (ref >90)
Glucose, Bld: 121 mg/dL — ABNORMAL HIGH (ref 70–99)
POTASSIUM: 3.5 meq/L — AB (ref 3.7–5.3)
Sodium: 139 mEq/L (ref 137–147)
Total Protein: 8.5 g/dL — ABNORMAL HIGH (ref 6.0–8.3)

## 2014-02-24 LAB — RAPID STREP SCREEN (MED CTR MEBANE ONLY): STREPTOCOCCUS, GROUP A SCREEN (DIRECT): NEGATIVE

## 2014-02-24 LAB — CBC WITH DIFFERENTIAL/PLATELET
BASOS ABS: 0 10*3/uL (ref 0.0–0.1)
Basophils Relative: 0 % (ref 0–1)
Eosinophils Absolute: 0.1 10*3/uL (ref 0.0–0.7)
Eosinophils Relative: 1 % (ref 0–5)
HCT: 39.8 % (ref 39.0–52.0)
Hemoglobin: 14.7 g/dL (ref 13.0–17.0)
LYMPHS PCT: 11 % — AB (ref 12–46)
Lymphs Abs: 1.5 10*3/uL (ref 0.7–4.0)
MCH: 34.3 pg — ABNORMAL HIGH (ref 26.0–34.0)
MCHC: 36.9 g/dL — AB (ref 30.0–36.0)
MCV: 93 fL (ref 78.0–100.0)
Monocytes Absolute: 1.5 10*3/uL — ABNORMAL HIGH (ref 0.1–1.0)
Monocytes Relative: 11 % (ref 3–12)
NEUTROS ABS: 11.3 10*3/uL — AB (ref 1.7–7.7)
Neutrophils Relative %: 78 % — ABNORMAL HIGH (ref 43–77)
Platelets: 185 10*3/uL (ref 150–400)
RBC: 4.28 MIL/uL (ref 4.22–5.81)
RDW: 12.9 % (ref 11.5–15.5)
WBC: 14.4 10*3/uL — AB (ref 4.0–10.5)

## 2014-02-24 LAB — URINALYSIS, ROUTINE W REFLEX MICROSCOPIC
GLUCOSE, UA: NEGATIVE mg/dL
Hgb urine dipstick: NEGATIVE
Ketones, ur: NEGATIVE mg/dL
NITRITE: NEGATIVE
PH: 7.5 (ref 5.0–8.0)
Protein, ur: 30 mg/dL — AB
SPECIFIC GRAVITY, URINE: 1.029 (ref 1.005–1.030)
Urobilinogen, UA: 0.2 mg/dL (ref 0.0–1.0)

## 2014-02-24 LAB — URINE MICROSCOPIC-ADD ON

## 2014-02-24 LAB — LIPASE, BLOOD: LIPASE: 30 U/L (ref 11–59)

## 2014-02-24 LAB — ETHANOL: Alcohol, Ethyl (B): <11 mg/dL (ref 0–11)

## 2014-02-24 MED ORDER — SODIUM CHLORIDE 0.9 % IV SOLN: INTRAVENOUS | Status: DC

## 2014-02-24 MED ORDER — SODIUM CHLORIDE 0.9 % IV BOLUS (SEPSIS)
1000.0000 mL | Freq: Once | INTRAVENOUS | Status: AC
  Administered 2014-02-24: 1000 mL via INTRAVENOUS

## 2014-02-24 MED ORDER — ONDANSETRON 4 MG PO TBDP: 4.0000 mg | ORAL_TABLET | Freq: Three times a day (TID) | ORAL | Status: DC | PRN

## 2014-02-24 NOTE — ED Provider Notes (Signed)
CSN: [id number]     Arrival date & time 02/24/14  0707 History   First MD Initiated Contact with Patient 02/24/14 [phone]     Chief Complaint  Patient presents with  . Abdominal Pain  . Emesis  . Nausea  . Diarrhea      HPI Pt was seen at 0735. Per pt, c/o gradual onset and persistence of multiple intermittent episodes of N/V/Mueller that began last night.   Describes the stools as "loose." Has been associated with generalized abd "cramping."  Denies CP/SOB, no back pain, no fevers, no black or blood in stools or emesis.     Past Medical History  Diagnosis Date  . Seizure disorder   . Seizures   . Allergy   . Anxiety   . Hepatitis   . GERD (gastroesophageal reflux disease)   . Pancreatitis   . Thrombocytopenia   . Alcoholic liver disease    Past Surgical History  Procedure Laterality Date  . Wisdom tooth extraction     Family History  Problem Relation Age of Onset  . Heart disease      cardiovascular disease  . Cancer    . Hypertension Father    History  Substance Use Topics  . Smoking status: Never Smoker   . Smokeless tobacco: Never Used  . Alcohol Use: No     Comment: no EtOH since 01/13/2013    Review of Systems ROS: Statement: All systems negative except as marked or noted in the HPI; Constitutional: Negative for fever and chills. ; ; Eyes: Negative for eye pain, redness and discharge. ; ; ENMT: Negative for ear pain, hoarseness, nasal congestion, sinus pressure and sore throat. ; ; Cardiovascular: Negative for chest pain, palpitations, diaphoresis, dyspnea and peripheral edema. ; ; Respiratory: Negative for cough, wheezing and stridor. ; ; Gastrointestinal: +N/V/Mueller, abd pain. Negative for blood in stool, hematemesis, jaundice and rectal bleeding. . ; ; Genitourinary: Negative for dysuria, flank pain and hematuria. ; ; Musculoskeletal: Negative for back pain and neck pain. Negative for swelling and trauma.; ; Skin: Negative for pruritus, rash, abrasions, blisters, bruising  and skin lesion.; ; Neuro: Negative for headache, lightheadedness and neck stiffness. Negative for weakness, altered level of consciousness , altered mental status, extremity weakness, paresthesias, involuntary movement, seizure and syncope.     Allergies  Oxycodone  Home Medications   Prior to Admission medications   Medication Sig Start Date End Date Taking? Authorizing Provider  calcium carbonate (OS-CAL - DOSED IN MG OF ELEMENTAL CALCIUM) 1250 MG tablet Take 2 tablets by mouth daily.   Yes Historical Provider, MD  levETIRAcetam (KEPPRA) 1000 MG tablet Take 1.5 tablets (1,500 mg total) by mouth 2 (two) times daily. 11/02/13  Yes Nishant Dhungel, MD  Multiple Vitamin (MULTI-VITAMINS) TABS Take 1 tablet by mouth daily.   Yes Historical Provider, MD  POTASSIUM PO Take 4 tablets by mouth daily.   Yes Historical Provider, MD   BP 138/93  Pulse 113  Temp(Src) 98.1 F (36.7 C) (Oral)  Resp 18  SpO2 100%   Physical Exam 0740: Physical examination:  Nursing notes reviewed; Vital signs and O2 SAT reviewed;  Constitutional: Well developed, Well nourished, Well hydrated, In no acute distress; Head:  Normocephalic, atraumatic; Eyes: EOMI, PERRL, No scleral icterus; ENMT: Mouth and pharynx normal, Mucous membranes moist; Neck: Supple, Full range of motion, No lymphadenopathy; Cardiovascular: Regular rate and rhythm, No murmur, rub, or gallop; Respiratory: Breath sounds clear & equal bilaterally, No rales, rhonchi, wheezes.  Speaking  full sentences with ease, Normal respiratory effort/excursion; Chest: Nontender, Movement normal; Abdomen: Soft, +mild mid-epigastric tenderness to palp. No rebound or guarding. Nondistended, Normal bowel sounds; Genitourinary: No CVA tenderness; Extremities: Pulses normal, No tenderness, No edema, No calf edema or asymmetry.; Neuro: AA&Ox3, Major CN grossly intact.  Speech clear. No gross focal motor or sensory deficits in extremities.; Skin: Color normal, Warm, Dry.   ED  Course  Procedures     EKG Interpretation None      MDM  MDM Reviewed: previous chart, nursing note and vitals Reviewed previous: labs Interpretation: labs and x-ray     Results for orders placed during the hospital encounter of 02/24/14  RAPID STREP SCREEN      Result Value Ref Range   Streptococcus, Group A Screen (Direct) NEGATIVE  NEGATIVE  CBC WITH DIFFERENTIAL      Result Value Ref Range   WBC 14.4 (*) 4.0 - 10.5 K/uL   RBC 4.28  4.22 - 5.81 MIL/uL   Hemoglobin 14.7  13.0 - 17.0 g/dL   HCT 40.939.8  81.139.0 - 91.452.0 %   MCV 93.0  78.0 - 100.0 fL   MCH 34.3 (*) 26.0 - 34.0 pg   MCHC 36.9 (*) 30.0 - 36.0 g/dL   RDW 78.212.9  95.611.5 - 21.315.5 %   Platelets 185  150 - 400 K/uL   Neutrophils Relative % 78 (*) 43 - 77 %   Neutro Abs 11.3 (*) 1.7 - 7.7 K/uL   Lymphocytes Relative 11 (*) 12 - 46 %   Lymphs Abs 1.5  0.7 - 4.0 K/uL   Monocytes Relative 11  3 - 12 %   Monocytes Absolute 1.5 (*) 0.1 - 1.0 K/uL   Eosinophils Relative 1  0 - 5 %   Eosinophils Absolute 0.1  0.0 - 0.7 K/uL   Basophils Relative 0  0 - 1 %   Basophils Absolute 0.0  0.0 - 0.1 K/uL  COMPREHENSIVE METABOLIC PANEL      Result Value Ref Range   Sodium 139  137 - 147 mEq/L   Potassium 3.5 (*) 3.7 - 5.3 mEq/L   Chloride 92 (*) 96 - 112 mEq/L   CO2 25  19 - 32 mEq/L   Glucose, Bld 121 (*) 70 - 99 mg/dL   BUN 10  6 - 23 mg/dL   Creatinine, Ser 0.860.78  0.50 - 1.35 mg/dL   Calcium 9.7  8.4 - 57.810.5 mg/dL   Total Protein 8.5 (*) 6.0 - 8.3 g/dL   Albumin 4.8  3.5 - 5.2 g/dL   AST 469143 (*) 0 - 37 U/L   ALT 102 (*) 0 - 53 U/L   Alkaline Phosphatase 149 (*) 39 - 117 U/L   Total Bilirubin 2.5 (*) 0.3 - 1.2 mg/dL   GFR calc non Af Amer >90  >90 mL/min   GFR calc Af Amer >90  >90 mL/min   Anion gap 22 (*) 5 - 15  LIPASE, BLOOD      Result Value Ref Range   Lipase 30  11 - 59 U/L  URINALYSIS, ROUTINE W REFLEX MICROSCOPIC      Result Value Ref Range   Color, Urine ORANGE (*) YELLOW   APPearance CLEAR  CLEAR   Specific  Gravity, Urine 1.029  1.005 - 1.030   pH 7.5  5.0 - 8.0   Glucose, UA NEGATIVE  NEGATIVE mg/dL   Hgb urine dipstick NEGATIVE  NEGATIVE   Bilirubin Urine SMALL (*) NEGATIVE  Ketones, ur NEGATIVE  NEGATIVE mg/dL   Protein, ur 30 (*) NEGATIVE mg/dL   Urobilinogen, UA 0.2  0.0 - 1.0 mg/dL   Nitrite NEGATIVE  NEGATIVE   Leukocytes, UA SMALL (*) NEGATIVE  ETHANOL      Result Value Ref Range   Alcohol, Ethyl (B) <11  0 - 11 mg/dL  URINE MICROSCOPIC-ADD ON      Result Value Ref Range   Squamous Epithelial / LPF RARE  RARE   WBC, UA 0-2  <3 WBC/hpf   Urine-Other MUCOUS PRESENT     Dg Abd Acute W/chest 02/24/2014   CLINICAL DATA:  Nonsmoker, nausea, vomiting since Saturday, mid abdominal pain  EXAM: ACUTE ABDOMEN SERIES (ABDOMEN 2 VIEW & CHEST 1 VIEW)  COMPARISON:  None.  FINDINGS: There is no evidence of dilated bowel loops or free intraperitoneal air. No radiopaque calculi or other significant radiographic abnormality is seen. Heart size and mediastinal contours are within normal limits. Both lungs are clear.  IMPRESSION: Negative abdominal radiographs.  No acute cardiopulmonary disease.   Electronically Signed   By: Elige KoHetal  Patel   On: 02/24/2014 08:28   Results for Eric Mueller, Eric Mueller (MRN [id number]) as of 02/24/2014 11:43  Ref. Range 11/02/2013 06:30 11/03/2013 04:15 02/24/2014 08:48  AST Latest Range: 0-37 U/L 348 (H) 252 (H) 143 (H)  ALT Latest Range: 0-53 U/L 65 (H) 64 (H) 102 (H)  Total Bilirubin Latest Range: 0.3-1.2 mg/dL 3.4 (H) 3.0 (H) 2.5 (H)    1130:  LFT's per baseline. Pt has tol PO well while in the ED without N/V.  No stooling while in the ED.  Abd benign, VSS. Pt has ambulated with steady gait. Feels better and wants to go home now. Dx and testing Mueller/w pt and family.  Questions answered.  Verb understanding, agreeable to Mueller/c home with outpt f/u.    Samuel JesterKathleen Daci Stubbe, DO 02/27/14 1259

## 2014-02-24 NOTE — ED Notes (Signed)
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  C/o gen abd pain, NVD since 2000 last night.

## 2014-02-24 NOTE — ED Notes (Signed)
He has drank some ginger ale and had no issues with it.  He ambulates without difficulty.

## 2014-02-24 NOTE — Discharge Instructions (Signed)
°Emergency Department Resource Guide °1) Find a Doctor and Pay Out of Pocket °Although you won't have to find out who is covered by your insurance plan, it is a good idea to ask around and get recommendations. You will then need to call the office and see if the doctor you have chosen will accept you as a new patient and what types of options they offer for patients who are self-pay. Some doctors offer discounts or will set up payment plans for their patients who do not have insurance, but you will need to ask so you aren't surprised when you get to your appointment. ° °2) Contact Your Local Health Department °Not all health departments have doctors that can see patients for sick visits, but many do, so it is worth a call to see if yours does. If you don't know where your local health department is, you can check in your phone book. The CDC also has a tool to help you locate your state's health department, and many state websites also have listings of all of their local health departments. ° °3) Find a Walk-in Clinic °If your illness is not likely to be very severe or complicated, you may want to try a walk in clinic. These are popping up all over the country in pharmacies, drugstores, and shopping centers. They're usually staffed by nurse practitioners or physician assistants that have been trained to treat common illnesses and complaints. They're usually fairly quick and inexpensive. However, if you have serious medical issues or chronic medical problems, these are probably not your best option. ° °No Primary Care Doctor: °- Call [facility] at  832-8000 - they can help you locate a primary care doctor that  accepts your insurance, provides certain services, etc. °- Physician Referral Service- 1-800-533-3463 ° °Chronic Pain Problems: °Organization         Address  Phone   Notes  °[facility]  (336) 297-2271 Patients need to be referred by their primary care doctor.  ° °Medication  Assistance: °Organization         Address  Phone   Notes  °[place] [facility] [address] °[place], [place] [address] (336) 641-8030 --Must be a resident of [place] °-- Must have NO insurance coverage whatsoever (no Medicaid/ Medicare, etc.) °-- The pt. MUST have a primary care doctor that directs their care regularly and follows them in the community °  °[facility]  (866) 331-1348   °[employer]  (888) 892-1162   ° °Agencies that provide inexpensive medical care: °Organization         Address  Phone   Notes  °[facility]  (336) 832-8035   °[facility]    (336) 832-7272   °[facility] [address] °[place], [place] [address] (336) 832-4777   °[facility] [address], °[place] (336) 271-4999   °[facility]    (336) 373-0678   °[facility]    (336) 272-1050   °[facility] ° [address], [place] Phone:  (336) 832-4444, Fax:  (336) 832-4440 Hours of Operation:  9 am - 6 pm, M-F.  Also accepts Medicaid/Medicare and self-pay.  °[facility] ° [address], [place] Phone: (336) 832-3150, Fax: (336) 832-3151. Hours of Operation:  8:30 am - 5:30 pm, M-F.  Also accepts Medicaid and self-pay.  °[facility] [place] [address], [place] Phone: (336) 878-6027   °[facility] [address], [place], [place] (336)723-1848, Ext. 123 Mondays & Thursdays: 7-9 AM.  First 15 patients are seen on a first come, first serve basis. °  ° °Medicaid-accepting [place] Providers: ° °Organization         Address  Phone   Notes  °[facility] [address], [place] (336) 641-2100 Also accepts self-pay patients.  °[facility] [address], [place] ° (336) 856-9996   °[facility] [address], [place]  (336) 288-8857   °[facility] [address] [place] [address], [place] (336) 299-7000   °Veita Bland [address], [place]  ° (336) 373-1557 Only accepts [place] Access Medicaid patients after they have their name applied to their card.  ° °Self-Pay (no insurance) in [place]: ° °Organization         Address  Phone   Notes  °Sickle Cell Patients, [facility] [address], [place] (336) 832-1970   °[facility] [address], [place] (336) 832-4400   °[facility] ° [address], [place] (336) 992-4800   °[facility]/Dr. Osei-Bonsu ° [address] [place] [address], [place] or [address], [place] (336) 841-8500 Phone number for both [place] and [place] locations is the same.  °[facility] [address], [place] (336) 299-0000   °[facility] [address] [place] [address], [place] or [address] (336) 852-7530 °(336) 878-2260   °[facility] [address], [place] (336) 350-1642, phone; (336) 294-5005, fax Sees patients 1st and 3rd Saturday of every month.  Must not qualify for public or private insurance (i.e. Medicaid, Medicare, [place] Health Choice, Veterans' Benefits) • Household income should be no more than 200% of the poverty level •The clinic cannot treat you if you are pregnant or think you are pregnant • Sexually transmitted diseases are not treated at the clinic.  ° ° °Dental Care: °Organization         Address  Phone  Notes  °[place] [facility] [address], [place] (336) 641-6152 Accepts children up to age 21 who are enrolled in Medicaid or [place] Health Choice; pregnant women with a Medicaid card; and children who have applied for Medicaid or [place] Health Choice, but were declined, whose parents can pay a reduced fee at time of service.  °[place]  [facility] [place]  [address], [place] (336) 641-7733 Accepts children up to age 21 who are enrolled in Medicaid or [place] Health Choice; pregnant women with a Medicaid card; and children who have applied for Medicaid or [place] Health Choice, but were declined, whose parents can pay a reduced fee at time of service.  °[facility] ° [address],  (336) 641-4533 Patients are seen by appointment only. Walk-ins are not accepted. [facility] will see patients 18 years of age and older. °Monday - Tuesday (8am-5pm) °Most Wednesdays (8:30-5pm) °$30 per visit, cash only  °[facility] ° [address], [place] (336) 641-4533 Patients are seen by appointment only. Walk-ins are not accepted. [facility] will see patients 18 years of age and older. °One   Wednesday Evening (Monthly: Volunteer Based).  $30 per visit, cash only  °[facility]  (919) 537-3737 for adults; Children under age 4, call Graduate Pediatric Dentistry at (919) [phone]. Children aged 4-14, please call (919) [phone] to request a pediatric application. ° Dental services are provided in all areas of dental care including fillings, crowns and bridges, complete and partial dentures, implants, gum treatment, root canals, and extractions. Preventive care is also provided. Treatment is provided to both adults and children. °Patients are selected via a lottery and there is often a waiting list. °  °[facility] [address], °[place] ° (336) 763-8833 [email or web address] °  °[facility] [address], [place], [place] (336)723-1848, Ext. 123 Second and Fourth Thursday of each month, opens at 6:30 AM; Clinic ends at 9 AM.  Patients are seen on a first-come first-served basis, and a limited number are seen during each clinic.  ° °[facility] ° [address], [place], [place] (336) 723-7904    Eligibility Requirements °You must have lived in [place], [place], or [place] counties for at least the last three months. °  You cannot be eligible for state or federal sponsored healthcare insurance, including Veterans Administration, Medicaid, or Medicare. °  You generally cannot be eligible for healthcare insurance through your employer.  °  How to apply: °Eligibility screenings are held every Tuesday and Wednesday afternoon from 1:00 pm until 4:00 pm. You do not need an appointment for the interview!  °[facility] [address], [place], [place] 336-631-2330   °[facility]  336-342-8273   °[facility]  336-703-3100   °[facility]  336-570-6415   ° °Behavioral Health Resources in the Community: °Intensive Outpatient Programs °Organization         Address  Phone  Notes  °[facility] [address], [place], [place] 336-878-6098   °[facility] [address], [place], [place] 336-832-9800   °[facility] [address], [place], [place] ° 336-882-2125   °[facility] [address],  °[place], [place] 1-800-853-5163 or 336-641-4981   °Substance Abuse Resources °Organization         Address  Phone  Notes  °[facility]  336-882-2125   °[facility]  336-784-9470   °[facility]  336-285-9073   °[facility]  336-845-3988   °Residential & Outpatient Substance Abuse Program  1-800-659-3381   °Psychological Services °Organization         Address  Phone  Notes  °[facility]  336- 832-9600   °[facility]  336- 378-7881   °[facility] [address], [place] 1-800-853-5163 or 336-641-4981   ° °Mobile Crisis Teams °Organization         Address  Phone  Notes  °[facility], Mobile Crisis Care Unit  1-877-626-1772   °Assertive °[facility] ° [address]  [place], [place] 336-834-9664   °Sharon DeEsch [address] °[place] 336-554-5454   ° °Self-Help/Support Groups °Organization         Address  Phone             Notes  °[facility]  - variety of support groups  336- 373-1402 Call for more information  °Narcotics Anonymous (NA), [facility] [address], °[place]  2 meetings at this location  ° °  Residential Treatment Programs °Organization         Address  Phone  Notes  °[facility] [address],    °[place]  1-866-801-8205   °[facility] ° [address], [place], [place] 704-293-8524   °[facility] [address], [place] 336-845-3988 Admissions: 8am-3pm M-F  °[facility] [address]    °[place], [place] 336-841-1104   °[facility] [address], [place], [place] 336-379-7146   °[facility] [address]  °[place], [place] 336-285-9073   °[facility] Intensive Outpatient [address], [place], [place] 336-852-3033   °[facility] [address]  °[place], [place] 1-877-615-2722 or 336-784-9470   °[facility] [address] [place], [place] 336-227-7417 Accepts Medicaid  °[facility] [address]  °[place] 1-800-659-3381 Substance Abuse/Addiction Treatment  ° °[place] Behavioral Health Resources °Organization         Address  Phone  Notes  °[facility]  (888) 581-9988   °Julie Brannon, PhD [address] [place], [place]   (336) 349-5553 or (336) 951-0000   °[facility]   [address] °[place], [place] (336) 349-4454   °[facility] [address], [place], [place] (336) 342-8316 Insurance/Medicaid/sponsorship through [facility]  °[facility] [address]                                    [place], [place] (336) 342-8316 Therapy/tele-psych/case    °[facility] [address]  ° [place], [place] (336) 349-2233    °Dr. Arfeen  (336) 349-4544   °[facility] [place]  [employer] [place] [facility] 1) [address], [place] °2) [address], [place] °3)  [address], [place] (336) 349-3220 °(336) 342-7768 ° °(336) 342-8140   °[place] [facility] (336) 342-1394 or (336) 342-3537 (After Hours)    ° ° ° °Take the prescription as directed.  Increase your fluid intake (ie:  Gatoraide) for the next few days, as discussed.  Eat a bland diet and advance to your regular diet slowly as you can tolerate it.   Avoid full strength juices, as well as milk and milk products until your diarrhea has resolved.  Call your regular medical doctor today to schedule a follow up appointment in the next 2 days.  Return to the Emergency Department immediately if not improving (or even worsening) despite taking the medicines as prescribed, any black or bloody stool or vomit, if you develop a fever over "101," or for any other concerns. ° °

## 2014-02-26 LAB — CULTURE, GROUP A STREP

## 2014-08-20 ENCOUNTER — Encounter (HOSPITAL_COMMUNITY): Payer: Self-pay | Admitting: Emergency Medicine

## 2014-08-20 ENCOUNTER — Emergency Department (HOSPITAL_COMMUNITY)
Admission: EM | Admit: 2014-08-20 | Discharge: 2014-08-20 | Disposition: A | Discharge status: Home / Self Care | Payer: BLUE CROSS/BLUE SHIELD | Attending: Emergency Medicine | Admitting: Emergency Medicine

## 2014-08-20 DIAGNOSIS — Z862 Personal history of diseases of the blood and blood-forming organs and certain disorders involving the immune mechanism: Secondary | ICD-10-CM | POA: Diagnosis not present

## 2014-08-20 DIAGNOSIS — E7229 Other disorders of urea cycle metabolism: Secondary | ICD-10-CM | POA: Insufficient documentation

## 2014-08-20 DIAGNOSIS — R4182 Altered mental status, unspecified: Secondary | ICD-10-CM | POA: Diagnosis present

## 2014-08-20 DIAGNOSIS — Z8659 Personal history of other mental and behavioral disorders: Secondary | ICD-10-CM | POA: Insufficient documentation

## 2014-08-20 DIAGNOSIS — E7220 Disorder of urea cycle metabolism, unspecified: Secondary | ICD-10-CM

## 2014-08-20 DIAGNOSIS — Z79899 Other long term (current) drug therapy: Secondary | ICD-10-CM | POA: Insufficient documentation

## 2014-08-20 DIAGNOSIS — G40909 Epilepsy, unspecified, not intractable, without status epilepticus: Secondary | ICD-10-CM | POA: Insufficient documentation

## 2014-08-20 DIAGNOSIS — Z8719 Personal history of other diseases of the digestive system: Secondary | ICD-10-CM | POA: Diagnosis not present

## 2014-08-20 LAB — COMPREHENSIVE METABOLIC PANEL
ALK PHOS: 130 U/L — AB (ref 39–117)
ALT: 180 U/L — AB (ref 0–53)
AST: 314 U/L — ABNORMAL HIGH (ref 0–37)
Albumin: 4.9 g/dL (ref 3.5–5.2)
Anion gap: 11 (ref 5–15)
BILIRUBIN TOTAL: 1 mg/dL (ref 0.3–1.2)
BUN: 6 mg/dL (ref 6–23)
CALCIUM: 8.4 mg/dL (ref 8.4–10.5)
CO2: 28 mmol/L (ref 19–32)
CREATININE: 0.66 mg/dL (ref 0.50–1.35)
Chloride: 100 mmol/L (ref 96–112)
GFR calc Af Amer: >90 mL/min (ref >90)
Glucose, Bld: 130 mg/dL — ABNORMAL HIGH (ref 70–99)
POTASSIUM: 3.6 mmol/L (ref 3.5–5.1)
Sodium: 139 mmol/L (ref 135–145)
Total Protein: 8.4 g/dL — ABNORMAL HIGH (ref 6.0–8.3)

## 2014-08-20 LAB — CBC WITH DIFFERENTIAL/PLATELET
BASOS PCT: 1 % (ref 0–1)
Basophils Absolute: 0 10*3/uL (ref 0.0–0.1)
EOS ABS: 0.1 10*3/uL (ref 0.0–0.7)
Eosinophils Relative: 2 % (ref 0–5)
HCT: 43.8 % (ref 39.0–52.0)
Hemoglobin: 15.7 g/dL (ref 13.0–17.0)
Lymphocytes Relative: 38 % (ref 12–46)
Lymphs Abs: 2.3 10*3/uL (ref 0.7–4.0)
MCH: 35.9 pg — ABNORMAL HIGH (ref 26.0–34.0)
MCHC: 35.8 g/dL (ref 30.0–36.0)
MCV: 100.2 fL — AB (ref 78.0–100.0)
MONO ABS: 0.7 10*3/uL (ref 0.1–1.0)
Monocytes Relative: 12 % (ref 3–12)
Neutro Abs: 2.9 10*3/uL (ref 1.7–7.7)
Neutrophils Relative %: 47 % (ref 43–77)
PLATELETS: 183 10*3/uL (ref 150–400)
RBC: 4.37 MIL/uL (ref 4.22–5.81)
RDW: 12.6 % (ref 11.5–15.5)
WBC: 6 10*3/uL (ref 4.0–10.5)

## 2014-08-20 LAB — AMMONIA: AMMONIA: 63 umol/L — AB (ref 11–32)

## 2014-08-20 MED ORDER — LACTULOSE 20 GM/30ML PO SOLN: 20.0000 g | Freq: Four times a day (QID) | ORAL | Status: DC

## 2014-08-20 NOTE — ED Notes (Addendum)
Pt's mother reports patient has had increased confusion today at work. Patient reports he was unable to remember his passwords and SSN numbers at work. Pt answering all questions appropriately at this time. Ambulatory and moving all extremities. Says, "I usually get an aura but today I have just been super confused." Mother also reports patient sometimes has increased ammonia levels. Is not taking Lactulose at this time but has in the past. Hx ETOH abuse. No longer drinks alcohol. Takes 1500 mg total of Keprra /day. Mother says patient "gets stuttery and acts confused at night usually but not during the day." RN at bedside.

## 2014-08-20 NOTE — Discharge Instructions (Signed)
Use lactulose as directed and follow-up with Dr. Elnoria HowardHung

## 2014-08-20 NOTE — ED Provider Notes (Signed)
CSN: [id number]     Arrival date & time 08/20/14  1701 History   First MD Initiated Contact with Patient 08/20/14 1709     Chief Complaint  Patient presents with  . Altered Mental Status  . Hx Seizures      (Consider location/radiation/quality/duration/timing/severity/associated sxs/prior Treatment) HPI Comments: Pt here with confusion that began today and has been improving--h/o siezure d/o and takes keppra which he has been compliant with, no recent changes to his medication-confusion described as short term memory trouble which was transient-denies fever, vomiting, headache, photophobia--no reported seziure pta--no medications taken for this complaint  Patient is a 29 y.o. male presenting with altered mental status. The history is provided by the patient.  Altered Mental Status   Past Medical History  Diagnosis Date  . Seizure disorder   . Seizures   . Allergy   . Anxiety   . Hepatitis   . GERD (gastroesophageal reflux disease)   . Pancreatitis   . Thrombocytopenia   . Alcoholic liver disease   . Transaminitis    Past Surgical History  Procedure Laterality Date  . Wisdom tooth extraction     Family History  Problem Relation Age of Onset  . Heart disease      cardiovascular disease  . Cancer    . Hypertension Father    History  Substance Use Topics  . Smoking status: Never Smoker   . Smokeless tobacco: Never Used  . Alcohol Use: No     Comment: no EtOH since 01/13/2013    Review of Systems  All other systems reviewed and are negative.     Allergies  Oxycodone  Home Medications   Prior to Admission medications   Medication Sig Start Date End Date Taking? Authorizing Provider  calcium carbonate (OS-CAL - DOSED IN MG OF ELEMENTAL CALCIUM) 1250 MG tablet Take 2 tablets by mouth daily.    Historical Provider, MD  levETIRAcetam (KEPPRA) 1000 MG tablet Take 1.5 tablets (1,500 mg total) by mouth 2 (two) times daily. 11/02/13   Nishant Dhungel, MD  Multiple  Vitamin (MULTI-VITAMINS) TABS Take 1 tablet by mouth daily.    Historical Provider, MD  ondansetron (ZOFRAN ODT) 4 MG disintegrating tablet Take 1 tablet (4 mg total) by mouth every 8 (eight) hours as needed for nausea or vomiting. 02/24/14   Samuel JesterKathleen McManus, DO  POTASSIUM PO Take 4 tablets by mouth daily.    Historical Provider, MD   There were no vitals taken for this visit. Physical Exam  Constitutional: He is oriented to person, place, and time. He appears well-developed and well-nourished.  Non-toxic appearance. No distress.  HENT:  Head: Normocephalic and atraumatic.  Eyes: Conjunctivae, EOM and lids are normal. Pupils are equal, round, and reactive to light.  Neck: Normal range of motion. Neck supple. No tracheal deviation present. No thyroid mass present.  Cardiovascular: Normal rate, regular rhythm and normal heart sounds.  Exam reveals no gallop.   No murmur heard. Pulmonary/Chest: Effort normal and breath sounds normal. No stridor. No respiratory distress. He has no decreased breath sounds. He has no wheezes. He has no rhonchi. He has no rales.  Abdominal: Soft. Normal appearance and bowel sounds are normal. He exhibits no distension. There is no tenderness. There is no rebound and no CVA tenderness.  Musculoskeletal: Normal range of motion. He exhibits no edema or tenderness.  Neurological: He is alert and oriented to person, place, and time. He has normal strength. No cranial nerve deficit or sensory deficit. GCS  eye subscore is 4. GCS verbal subscore is 5. GCS motor subscore is 6.  Skin: Skin is warm and dry. No abrasion and no rash noted.  Psychiatric: He has a normal mood and affect. His speech is normal and behavior is normal.  Nursing note and vitals reviewed.   ED Course  Procedures (including critical care time) Labs Review Labs Reviewed  AMMONIA  CBC WITH DIFFERENTIAL/PLATELET  COMPREHENSIVE METABOLIC PANEL    Imaging Review No results found.   EKG  Interpretation None      MDM   Final diagnoses:  None    Patient's ammonia level noted he'll be started on lactulose. He admits that he has chronic elevated LFTs from alcohol abuse and last used alcohol approximately a week and a half ago. Patient encouraged to abstain from alcohol use and to follow-up with Dr. Elnoria Howard his gastroenterologist    Lorre Nick, MD 08/20/14 [phone]

## 2014-10-06 ENCOUNTER — Telehealth: Payer: Self-pay | Admitting: Emergency Medicine

## 2014-10-06 ENCOUNTER — Ambulatory Visit (INDEPENDENT_AMBULATORY_CARE_PROVIDER_SITE_OTHER): Payer: BLUE CROSS/BLUE SHIELD | Admitting: Emergency Medicine

## 2014-10-06 ENCOUNTER — Encounter: Payer: Self-pay | Admitting: Emergency Medicine

## 2014-10-06 ENCOUNTER — Ambulatory Visit (INDEPENDENT_AMBULATORY_CARE_PROVIDER_SITE_OTHER): Payer: BLUE CROSS/BLUE SHIELD

## 2014-10-06 VITALS — BP 126/86 | HR 96 | Temp 98.8°F | Resp 16 | Ht 70.0 in | Wt 158.0 lb

## 2014-10-06 DIAGNOSIS — R112 Nausea with vomiting, unspecified: Secondary | ICD-10-CM

## 2014-10-06 DIAGNOSIS — R569 Unspecified convulsions: Secondary | ICD-10-CM

## 2014-10-06 DIAGNOSIS — Z79899 Other long term (current) drug therapy: Secondary | ICD-10-CM | POA: Diagnosis not present

## 2014-10-06 DIAGNOSIS — R160 Hepatomegaly, not elsewhere classified: Secondary | ICD-10-CM

## 2014-10-06 LAB — POCT CBC
Granulocyte percent: 74.3 %G (ref 37–80)
HEMATOCRIT: 42.1 % — AB (ref 43.5–53.7)
Hemoglobin: 14.1 g/dL (ref 14.1–18.1)
Lymph, poc: 1 (ref 0.6–3.4)
MCH: 34.6 pg — AB (ref 27–31.2)
MCHC: 33.5 g/dL (ref 31.8–35.4)
MCV: 103.4 fL — AB (ref 80–97)
MID (CBC): 0.5 (ref 0–0.9)
MPV: 6.9 fL (ref 0–99.8)
PLATELET COUNT, POC: 158 10*3/uL (ref 142–424)
POC GRANULOCYTE: 4.5 (ref 2–6.9)
POC LYMPH %: 17 % (ref 10–50)
POC MID %: 8.7 % (ref 0–12)
RBC: 4.07 M/uL — AB (ref 4.69–6.13)
RDW, POC: 12.7 %
WBC: 6 10*3/uL (ref 4.6–10.2)

## 2014-10-06 LAB — COMPLETE METABOLIC PANEL WITH GFR
ALBUMIN: 4.8 g/dL (ref 3.5–5.2)
ALK PHOS: 112 U/L (ref 39–117)
ALT: 123 U/L — ABNORMAL HIGH (ref 0–53)
AST: 193 U/L — ABNORMAL HIGH (ref 0–37)
BILIRUBIN TOTAL: 4.2 mg/dL — AB (ref 0.2–1.2)
BUN: 12 mg/dL (ref 6–23)
CALCIUM: 9.2 mg/dL (ref 8.4–10.5)
CO2: 34 meq/L — AB (ref 19–32)
CREATININE: 0.74 mg/dL (ref 0.50–1.35)
Chloride: 91 mEq/L — ABNORMAL LOW (ref 96–112)
GFR, Est African American: >89 mL/min
GFR, Est Non African American: >89 mL/min
GLUCOSE: 84 mg/dL (ref 70–99)
Potassium: 3.4 mEq/L — ABNORMAL LOW (ref 3.5–5.3)
Sodium: 139 mEq/L (ref 135–145)
Total Protein: 7.4 g/dL (ref 6.0–8.3)

## 2014-10-06 LAB — AMYLASE: AMYLASE: 32 U/L (ref 0–105)

## 2014-10-06 LAB — LIPASE

## 2014-10-06 LAB — AMMONIA: Ammonia: 95 umol/L — ABNORMAL HIGH (ref 16–53)

## 2014-10-06 NOTE — Patient Instructions (Signed)
I will call you once I get your blood work back. You may need a repeat CT of the abdomen to evaluate your pancreas. I placed a call to Dr. Elnoria HowardHung to discuss this with him. I put in a referral for you to see Dr. Elnoria HowardHung also I also made a referral for you to see a neurologist here in town to follow your  seizure disorder.

## 2014-10-06 NOTE — Progress Notes (Addendum)
Subjective:   This chart was scribed for Eric LitesSteve Bradi Arbuthnot, MD by Andrew Auaven Small, ED Scribe. This patient was seen in room 13 and the patient's care was started at 8:59 AM.   Patient ID: Eric Mueller, male    DOB: [birth year], 29 y.o.   MRN: [id number]  HPI Chief Complaint  Patient presents with  . Nausea    x x 4 days  . Emesis  . tightness in chest and abdomen    appetite loss   HPI Comments: Eric Mueller is a 29 y.o. Male with hx of pancreatitis, alcoholic liver disease, and seizures who presents to the [facility] complaining of nausea and emesis that began Sunday. Pt states he began to feel nauseous and clammy while mowing the lawn 3 days ago followed by 3-4 episodes of emesis within the last 3 days but has not vomited since yesterday. He reports a loss of appetite, chest tightness, abdominal distention and sensitivity to the heat outside causing nausea.  Pt reports nausea is similar to pancreatitis but is not as bad.  He was seen in ED 08/20/14 for confusion and nausea and was diagnosed with ammonia. Pt denies drinking alcohol and has been sober for a while now. Pt has not had a seizure since January. Pt last dose of keppra was 1 hour ago  [place] [facility] and is requesting referral to neurolog[place] in [place]. Pt works at [employer] a [employer] in [place] and notes he recently received a promotion. Pt lives with his mother. He has not been playing music as much due to his busy schedule.   Pt prefers to be called Eric Mueller.  Past Medical History  Diagnosis Date  . Seizure disorder   . Seizures   . Allergy   . Anxiety   . Hepatitis   . GERD (gastroesophageal reflux disease)   . Pancreatitis   . Thrombocytopenia   . Alcoholic liver disease   . Transaminitis    Allergies  Allergen Reactions  . Oxycodone Nausea And Vomiting   Prior to Admission medications   Medication Sig Start Date End Date Taking? Authorizing Provider  calcium  carbonate (OS-CAL - DOSED IN MG OF ELEMENTAL CALCIUM) 1250 MG tablet Take 2 tablets by mouth daily.    Historical Provider, MD  Lactulose 20 GM/30ML SOLN Take 30 mLs (20 g total) by mouth 4 (four) times daily. Use for 3 days 08/20/14   Lorre NickAnthony Allen, MD  levETIRAcetam (KEPPRA) 1000 MG tablet Take 1.5 tablets (1,500 mg total) by mouth 2 (two) times daily. 11/02/13   Nishant Dhungel, MD  Multiple Vitamin (MULTI-VITAMINS) TABS Take 1 tablet by mouth daily.    Historical Provider, MD  ondansetron (ZOFRAN ODT) 4 MG disintegrating tablet Take 1 tablet (4 mg total) by mouth every 8 (eight) hours as needed for nausea or vomiting. Patient not taking: Reported on 08/20/2014 02/24/14   Samuel JesterKathleen McManus, DO  ondansetron (ZOFRAN-ODT) 8 MG disintegrating tablet Take 1 tablet by mouth every 6 (six) hours as needed. Nausea/vomitting 07/28/14   Historical Provider, MD  POTASSIUM PO Take 4 tablets by mouth daily.    Historical Provider, MD   Review of Systems  Constitutional: Positive for appetite change ( decreased) and fatigue.  Respiratory: Positive for chest tightness.   Gastrointestinal: Positive for nausea, vomiting and abdominal distention.   Objective:   Physical Exam CONSTITUTIONAL: Well developed/well nourished HEAD: Normocephalic/atraumatic EYES: EOMI/PERRL ENMT: Mucous membranes moist mild sclera icterus NECK: supple no  meningeal signs SPINE/BACK:entire spine nontender CV: S1/S2 noted, no murmurs/rubs/gallops noted LUNGS: Lungs are clear to auscultation bilaterally, no apparent distress ABDOMEN: soft, nontender, no rebound or guarding, bowel sounds noted throughout abdomen. Slightly distended. Liver down 4-5 finger breast. No spleen felt.  GU:no cva tenderness NEURO: Pt is awake/alert/appropriate, moves all extremitiesx4.  No facial droop.   EXTREMITIES: pulses normal/equal, full ROM SKIN: warm, color normal PSYCH: no abnormalities of mood noted, alert and oriented to situation  Filed Vitals:    10/06/14 0843  BP: 126/86  Pulse: 96  Temp: 98.8 F (37.1 C)  TempSrc: Oral  Resp: 16  Height:  (1.778 m)  Weight: 158 lb (71.668 kg)  SpO2: 98%   Results for orders placed or performed in visit on 10/06/14  POCT CBC  Result Value Ref Range   WBC 6.0 4.6 - 10.2 K/uL   Lymph, poc 1.0 0.6 - 3.4   POC LYMPH PERCENT 17.0 10 - 50 %L   MID (cbc) 0.5 0 - 0.9   POC MID % 8.7 0 - 12 %M   POC Granulocyte 4.5 2 - 6.9   Granulocyte percent 74.3 37 - 80 %G   RBC 4.07 (A) 4.69 - 6.13 M/uL   Hemoglobin 14.1 14.1 - 18.1 g/dL   HCT, POC 16.1 (A) 09.6 - 53.7 %   MCV 103.4 (A) 80 - 97 fL   MCH, POC 34.6 (A) 27 - 31.2 pg   MCHC 33.5 31.8 - 35.4 g/dL   RDW, POC 04.5 %   Platelet Count, POC 158 142 - 424 K/uL   MPV 6.9 0 - 99.8 fL   [facility] reading (PRIMARY) by Dr. Cleta Alberts. Abdomen Hepatosplenomegaly. No evidence of obstruction. CXR normal  Assessment & Plan:    1. Hepatomegaly  - POCT CBC - Amylase - Lipase - COMPLETE METABOLIC PANEL WITH GFR - Ammonia - Ambulatory referral to Gastroenterology  2. High risk medications (not anticoagulants) long-term use Level done of this Keppra.  3. Seizures  - Levetiracetam level - Ambulatory referral to Neurology  4. Nausea and vomiting, vomiting of unspecified type I suspect the symptoms are secondary to pancreatitis. Labs are pending. I called Dr. Audley Hose to see if he would return my call did discuss this case. It is been over a year since he had a CT of the abdomen. This demonstrated acute pancreatitis without necrosis or pseudocyst. He certainly is high risk to develop a pseudocyst. I discussed this with Dr. Elnoria Howard he is going to see patient at 2:30 today - POCT CBC - Amylase - Lipase - COMPLETE METABOLIC PANEL WITH GFR - Ammonia - DG Abd Acute W/Chest; Future - EKG 12-Lead - Ambulatory referral to Gastroenterology   I personally performed the services described in this documentation, which was scribed in my presence. The recorded  information has been reviewed and is accurate.  Lesle Chris, MD  [facility], [facility]  10/06/2014 10:14 AM

## 2014-10-06 NOTE — Telephone Encounter (Signed)
Message left with the patient that I sent his lab work to Dr. Elnoria HowardHung to decide on the next step of management

## 2014-10-09 LAB — LEVETIRACETAM LEVEL: KEPPRA (LEVETIRACETAM): 55.7 ug/mL

## 2014-10-12 ENCOUNTER — Ambulatory Visit: Payer: Self-pay | Admitting: Emergency Medicine

## 2014-10-19 ENCOUNTER — Encounter (HOSPITAL_COMMUNITY): Payer: Self-pay | Admitting: Emergency Medicine

## 2014-10-19 ENCOUNTER — Other Ambulatory Visit: Payer: Self-pay | Admitting: Emergency Medicine

## 2014-10-19 ENCOUNTER — Emergency Department (HOSPITAL_COMMUNITY)
Admission: EM | Admit: 2014-10-19 | Discharge: 2014-10-19 | Disposition: A | Discharge status: Home / Self Care | Payer: BLUE CROSS/BLUE SHIELD | Attending: Emergency Medicine | Admitting: Emergency Medicine

## 2014-10-19 ENCOUNTER — Telehealth: Payer: Self-pay

## 2014-10-19 ENCOUNTER — Telehealth: Payer: Self-pay | Admitting: Emergency Medicine

## 2014-10-19 DIAGNOSIS — R112 Nausea with vomiting, unspecified: Secondary | ICD-10-CM

## 2014-10-19 DIAGNOSIS — M62838 Other muscle spasm: Secondary | ICD-10-CM | POA: Insufficient documentation

## 2014-10-19 DIAGNOSIS — Z8719 Personal history of other diseases of the digestive system: Secondary | ICD-10-CM | POA: Diagnosis not present

## 2014-10-19 DIAGNOSIS — Z8619 Personal history of other infectious and parasitic diseases: Secondary | ICD-10-CM | POA: Insufficient documentation

## 2014-10-19 DIAGNOSIS — G40909 Epilepsy, unspecified, not intractable, without status epilepticus: Secondary | ICD-10-CM | POA: Insufficient documentation

## 2014-10-19 DIAGNOSIS — R569 Unspecified convulsions: Secondary | ICD-10-CM

## 2014-10-19 DIAGNOSIS — Z862 Personal history of diseases of the blood and blood-forming organs and certain disorders involving the immune mechanism: Secondary | ICD-10-CM | POA: Diagnosis not present

## 2014-10-19 DIAGNOSIS — Z8659 Personal history of other mental and behavioral disorders: Secondary | ICD-10-CM | POA: Diagnosis not present

## 2014-10-19 LAB — CBC WITH DIFFERENTIAL/PLATELET
BASOS ABS: 0 10*3/uL (ref 0.0–0.1)
Basophils Relative: 0 % (ref 0–1)
Eosinophils Absolute: 0 10*3/uL (ref 0.0–0.7)
Eosinophils Relative: 0 % (ref 0–5)
HEMATOCRIT: 39.4 % (ref 39.0–52.0)
Hemoglobin: 14.2 g/dL (ref 13.0–17.0)
LYMPHS ABS: 0.4 10*3/uL — AB (ref 0.7–4.0)
LYMPHS PCT: 4 % — AB (ref 12–46)
MCH: 35.4 pg — ABNORMAL HIGH (ref 26.0–34.0)
MCHC: 36 g/dL (ref 30.0–36.0)
MCV: 98.3 fL (ref 78.0–100.0)
MONOS PCT: 7 % (ref 3–12)
Monocytes Absolute: 0.7 10*3/uL (ref 0.1–1.0)
NEUTROS PCT: 89 % — AB (ref 43–77)
Neutro Abs: 9.1 10*3/uL — ABNORMAL HIGH (ref 1.7–7.7)
Platelets: 178 10*3/uL (ref 150–400)
RBC: 4.01 MIL/uL — AB (ref 4.22–5.81)
RDW: 12 % (ref 11.5–15.5)
WBC: 10.2 10*3/uL (ref 4.0–10.5)

## 2014-10-19 LAB — COMPREHENSIVE METABOLIC PANEL
ALBUMIN: 4.4 g/dL (ref 3.5–5.0)
ALT: 72 U/L — ABNORMAL HIGH (ref 17–63)
ANION GAP: 22 — AB (ref 5–15)
AST: 159 U/L — AB (ref 15–41)
Alkaline Phosphatase: 118 U/L (ref 38–126)
BILIRUBIN TOTAL: 2.3 mg/dL — AB (ref 0.3–1.2)
BUN: 8 mg/dL (ref 6–20)
CO2: 26 mmol/L (ref 22–32)
CREATININE: 0.81 mg/dL (ref 0.61–1.24)
Calcium: 8.5 mg/dL — ABNORMAL LOW (ref 8.9–10.3)
Chloride: 94 mmol/L — ABNORMAL LOW (ref 101–111)
GFR calc Af Amer: >60 mL/min (ref >60)
GFR calc non Af Amer: >60 mL/min (ref >60)
Glucose, Bld: 172 mg/dL — ABNORMAL HIGH (ref 65–99)
Potassium: 2.9 mmol/L — ABNORMAL LOW (ref 3.5–5.1)
SODIUM: 142 mmol/L (ref 135–145)
TOTAL PROTEIN: 7.9 g/dL (ref 6.5–8.1)

## 2014-10-19 LAB — MAGNESIUM: Magnesium: 1.1 mg/dL — ABNORMAL LOW (ref 1.7–2.4)

## 2014-10-19 LAB — CK: Total CK: 136 U/L (ref 49–397)

## 2014-10-19 LAB — LIPASE, BLOOD: Lipase: 13 U/L — ABNORMAL LOW (ref 22–51)

## 2014-10-19 LAB — ETHANOL

## 2014-10-19 MED ORDER — MORPHINE SULFATE 4 MG/ML IJ SOLN
4.0000 mg | Freq: Once | INTRAMUSCULAR | Status: AC
  Administered 2014-10-19: 4 mg via INTRAVENOUS
  Filled 2014-10-19: qty 1

## 2014-10-19 MED ORDER — ONDANSETRON 4 MG PO TBDP: 4.0000 mg | ORAL_TABLET | Freq: Three times a day (TID) | ORAL | Status: DC | PRN

## 2014-10-19 MED ORDER — ONDANSETRON HCL 4 MG/2ML IJ SOLN
4.0000 mg | Freq: Once | INTRAMUSCULAR | Status: AC
  Administered 2014-10-19: 4 mg via INTRAVENOUS
  Filled 2014-10-19: qty 2

## 2014-10-19 MED ORDER — MAGNESIUM SULFATE 2 GM/50ML IV SOLN
2.0000 g | INTRAVENOUS | Status: AC
  Administered 2014-10-19: 2 g via INTRAVENOUS
  Filled 2014-10-19: qty 50

## 2014-10-19 MED ORDER — SODIUM CHLORIDE 0.9 % IV BOLUS (SEPSIS)
1000.0000 mL | Freq: Once | INTRAVENOUS | Status: AC
  Administered 2014-10-19: 1000 mL via INTRAVENOUS

## 2014-10-19 MED ORDER — MORPHINE SULFATE 4 MG/ML IJ SOLN
4.0000 mg | Freq: Once | INTRAMUSCULAR | Status: AC
Start: 2014-10-19 — End: 2014-10-19
  Administered 2014-10-19: 4 mg via INTRAVENOUS
  Filled 2014-10-19: qty 1

## 2014-10-19 MED ORDER — POTASSIUM CHLORIDE CRYS ER 20 MEQ PO TBCR
40.0000 meq | EXTENDED_RELEASE_TABLET | Freq: Once | ORAL | Status: AC
  Administered 2014-10-19: 40 meq via ORAL
  Filled 2014-10-19: qty 2

## 2014-10-19 MED ORDER — LORAZEPAM 2 MG/ML IJ SOLN
1.0000 mg | Freq: Once | INTRAMUSCULAR | Status: AC
  Administered 2014-10-19: 1 mg via INTRAVENOUS
  Filled 2014-10-19: qty 1

## 2014-10-19 NOTE — Telephone Encounter (Signed)
Call patient and let him know I sent in the Zofran. Tell patient he needs to let Dr. Elnoria Howard no he is still having nausea. Also check to be sure they have, contacted  him from [facility] neurology for his appointment regarding his seizures

## 2014-10-19 NOTE — ED Notes (Signed)
Mother states that pt had a seizure today lasting approx. 2 minutes. States that he has a hx of seizures stemming from alcohol but has not drank in 1.5 years. Also states pt was vomiting for several days. Alert and oriented.

## 2014-10-19 NOTE — ED Provider Notes (Signed)
CSN: [id number]     Arrival date & time 10/19/14  1754 History   First MD Initiated Contact with Patient 10/19/14 1813     Chief Complaint  Patient presents with  . Seizures  . Emesis     (Consider location/radiation/quality/duration/timing/severity/associated sxs/prior Treatment) HPI Comments: Patient with past medical history of seizures, anxiety, pancreatitis, presents to the emergency department with a chief complaint of nausea, vomiting, and seizure. Patient is compared with his mother, and states that he had a seizure today which lasted approximately 2 minutes. Patient takes Keppra for his seizures. States that he has had associated vomiting for the past several days. Denies fevers chills. States that he is having cramping and spasms of his hands and his feet. Nothing makes the symptoms better or worse.  The history is provided by the patient. No language interpreter was used.    Past Medical History  Diagnosis Date  . Seizure disorder   . Seizures   . Allergy   . Anxiety   . Hepatitis   . GERD (gastroesophageal reflux disease)   . Pancreatitis   . Thrombocytopenia   . Alcoholic liver disease   . Transaminitis    Past Surgical History  Procedure Laterality Date  . Wisdom tooth extraction     Family History  Problem Relation Age of Onset  . Heart disease      cardiovascular disease  . Cancer    . Hypertension Father    History  Substance Use Topics  . Smoking status: Never Smoker   . Smokeless tobacco: Never Used  . Alcohol Use: No     Comment: no EtOH since 01/13/2013    Review of Systems  Constitutional: Negative for fever and chills.  Respiratory: Negative for shortness of breath.   Cardiovascular: Negative for chest pain.  Gastrointestinal: Negative for nausea, vomiting, diarrhea and constipation.  Genitourinary: Negative for dysuria.  Musculoskeletal: Positive for myalgias.  Neurological: Positive for seizures.  All other systems reviewed and are  negative.     Allergies  Oxycodone  Home Medications   Prior to Admission medications   Medication Sig Start Date End Date Taking? Authorizing Provider  levETIRAcetam (KEPPRA) 1000 MG tablet Take 1.5 tablets (1,500 mg total) by mouth 2 (two) times daily. 11/02/13  Yes Nishant Dhungel, MD  ondansetron (ZOFRAN ODT) 4 MG disintegrating tablet Take 1 tablet (4 mg total) by mouth every 8 (eight) hours as needed for nausea or vomiting. 10/19/14  Yes Collene Gobble, MD  POTASSIUM PO Take 4 tablets by mouth daily.   Yes Historical Provider, MD   BP 132/81 mmHg  Pulse 109  Temp(Src) 98.2 F (36.8 C) (Oral)  Resp 22  SpO2 99% Physical Exam  Constitutional: He is oriented to person, place, and time. He appears well-developed and well-nourished.  HENT:  Head: Normocephalic and atraumatic.  Eyes: Conjunctivae and EOM are normal. Pupils are equal, round, and reactive to light. Right eye exhibits no discharge. Left eye exhibits no discharge. No scleral icterus.  Neck: Normal range of motion. Neck supple. No JVD present.  Cardiovascular: Normal rate, regular rhythm and normal heart sounds.  Exam reveals no gallop and no friction rub.   No murmur heard. Pulmonary/Chest: Effort normal and breath sounds normal. No respiratory distress. He has no wheezes. He has no rales. He exhibits no tenderness.  Abdominal: Soft. He exhibits no distension and no mass. There is no tenderness. There is no rebound and no guarding.  Musculoskeletal: Normal range of motion. He  exhibits no edema or tenderness.  Carpal/pedal muscle spasms  Neurological: He is alert and oriented to person, place, and time.  Skin: Skin is warm and dry.  Psychiatric: He has a normal mood and affect. His behavior is normal. Judgment and thought content normal.  Nursing note and vitals reviewed.   ED Course  Procedures (including critical care time) Results for orders placed or performed during the hospital encounter of 10/19/14  Magnesium   Result Value Ref Range   Magnesium 1.1 (L) 1.7 - 2.4 mg/dL  CBC with Differential/Platelet  Result Value Ref Range   WBC 10.2 4.0 - 10.5 K/uL   RBC 4.01 (L) 4.22 - 5.81 MIL/uL   Hemoglobin 14.2 13.0 - 17.0 g/dL   HCT 40.9 81.1 - 91.4 %   MCV 98.3 78.0 - 100.0 fL   MCH 35.4 (H) 26.0 - 34.0 pg   MCHC 36.0 30.0 - 36.0 g/dL   RDW 78.2 95.6 - 21.3 %   Platelets 178 150 - 400 K/uL   Neutrophils Relative % 89 (H) 43 - 77 %   Neutro Abs 9.1 (H) 1.7 - 7.7 K/uL   Lymphocytes Relative 4 (L) 12 - 46 %   Lymphs Abs 0.4 (L) 0.7 - 4.0 K/uL   Monocytes Relative 7 3 - 12 %   Monocytes Absolute 0.7 0.1 - 1.0 K/uL   Eosinophils Relative 0 0 - 5 %   Eosinophils Absolute 0.0 0.0 - 0.7 K/uL   Basophils Relative 0 0 - 1 %   Basophils Absolute 0.0 0.0 - 0.1 K/uL  Comprehensive metabolic panel  Result Value Ref Range   Sodium 142 135 - 145 mmol/L   Potassium 2.9 (L) 3.5 - 5.1 mmol/L   Chloride 94 (L) 101 - 111 mmol/L   CO2 26 22 - 32 mmol/L   Glucose, Bld 172 (H) 65 - 99 mg/dL   BUN 8 6 - 20 mg/dL   Creatinine, Ser 0.86 0.61 - 1.24 mg/dL   Calcium 8.5 (L) 8.9 - 10.3 mg/dL   Total Protein 7.9 6.5 - 8.1 g/dL   Albumin 4.4 3.5 - 5.0 g/dL   AST 578 (H) 15 - 41 U/L   ALT 72 (H) 17 - 63 U/L   Alkaline Phosphatase 118 38 - 126 U/L   Total Bilirubin 2.3 (H) 0.3 - 1.2 mg/dL   GFR calc non Af Amer >60 >60 mL/min   GFR calc Af Amer >60 >60 mL/min   Anion gap 22 (H) 5 - 15  Lipase, blood  Result Value Ref Range   Lipase 13 (L) 22 - 51 U/L  CK  Result Value Ref Range   Total CK 136 49 - 397 U/L  Ethanol  Result Value Ref Range   Alcohol, Ethyl (B) <5 <5 mg/dL   Dg Abd Acute W/chest  10/06/2014   CLINICAL DATA:  Abdominal pain  EXAM: DG ABDOMEN ACUTE W/ 1V CHEST  COMPARISON:  Chest and acute abdomen of 02/24/2014, ultrasound abdomen of 11/02/2013, and CT abdomen and pelvis of 06/23/2013  FINDINGS: No active infiltrate or effusion is seen. Mediastinal and hilar contours are unremarkable. The heart is  within normal limits in size. No bony abnormality is seen.  Supine and erect views of the abdomen show no bowel obstruction. No free air is seen. Prominence of the liver cannot be excluded. No opaque calculi are noted.  IMPRESSION: 1. No active lung disease. 2. No bowel obstruction. No free air. Cannot exclude enlargement of the liver.  Electronically Signed   By: Dwyane Dee M.D.   On: 10/06/2014 10:17     Imaging Review No results found.   EKG Interpretation None      MDM   Final diagnoses:  Nausea and vomiting, vomiting of unspecified type  Seizure  Muscle spasm    1845 Patient assessed by me and found to have severe carpal/pedal spasms.  Patient is alert and oriented.  Patient immediately seen by Dr. Lynelle Doctor, who agrees with plan for ativan and morphine.  Will check electrolytes and give fluids.  Will reassess.  Magnesium and potassium are low, will replace his emergency department. Abdomen is soft and nontender. Patient given fluids. States that he is now feeling better. He is tolerating oral intake. He has Zofran at home. Initial labs do show an elevated anion gap, suspect this is secondary to dehydration, andpatient is feeling better and tolerating oral intake, will recommend primary care follow-up. Patient states that he is ready for discharge.    Roxy Horseman, PA-C 10/19/14 2228  Linwood Dibbles, MD 10/19/14 2241

## 2014-10-19 NOTE — ED Notes (Signed)
Provided water for a PO challenge.

## 2014-10-19 NOTE — Discharge Instructions (Signed)
Epilepsy °Epilepsy is a disorder in which a person has repeated seizures over time. A seizure is a release of abnormal electrical activity in the brain. Seizures can cause a change in attention, behavior, or the ability to remain awake and alert (altered mental status). Seizures often involve uncontrollable shaking (convulsions).  °Most people with epilepsy lead normal lives. However, people with epilepsy are at an increased risk of falls, accidents, and injuries. Therefore, it is important to begin treatment right away. °CAUSES  °Epilepsy has many possible causes. Anything that disturbs the normal pattern of brain cell activity can lead to seizures. This may include:  °· Head injury. °· Birth trauma. °· High fever as a child. °· Stroke. °· Bleeding into or around the brain. °· Certain drugs. °· Prolonged low oxygen, such as what occurs after CPR efforts. °· Abnormal brain development. °· Certain illnesses, such as meningitis, encephalitis (brain infection), malaria, and other infections. °· An imbalance of nerve signaling chemicals (neurotransmitters).   °SIGNS AND SYMPTOMS  °The symptoms of a seizure can vary greatly from one person to another. Right before a seizure, you may have a warning (aura) that a seizure is about to occur. An aura may include the following symptoms: °· Fear or anxiety. °· Nausea. °· Feeling like the room is spinning (vertigo). °· Vision changes, such as seeing flashing lights or spots. °Common symptoms during a seizure include: °· Abnormal sensations, such as an abnormal smell or a bitter taste in the mouth.   °· Sudden, general body stiffness.   °· Convulsions that involve rhythmic jerking of the face, arm, or leg on one or both sides.   °· Sudden change in consciousness.   °¨ Appearing to be awake but not responding.   °¨ Appearing to be asleep but cannot be awakened.   °· Grimacing, chewing, lip smacking, drooling, tongue biting, or loss of bowel or bladder control. °After a seizure,  you may feel sleepy for a while.  °DIAGNOSIS  °Your health care provider will ask about your symptoms and take a medical history. Descriptions from any witnesses to your seizures will be very helpful in the diagnosis. A physical exam, including a detailed neurological exam, is necessary. Various tests may be done, such as:  °· An electroencephalogram (EEG). This is a painless test of your brain waves. In this test, a diagram is created of your brain waves. These diagrams can be interpreted by a specialist. °· An MRI of the brain.   °· A CT scan of the brain.   °· A spinal tap (lumbar puncture, LP). °· Blood tests to check for signs of infection or abnormal blood chemistry. °TREATMENT  °There is no cure for epilepsy, but it is generally treatable. Once epilepsy is diagnosed, it is important to begin treatment as soon as possible. For most people with epilepsy, seizures can be controlled with medicines. The following may also be used: °· A pacemaker for the brain (vagus nerve stimulator) can be used for people with seizures that are not well controlled by medicine. °· Surgery on the brain. °For some people, epilepsy eventually goes away. °HOME CARE INSTRUCTIONS  °· Follow your health care provider's recommendations on driving and safety in normal activities. °· Get enough rest. Lack of sleep can cause seizures. °· Only take over-the-counter or prescription medicines as directed by your health care provider. Take any prescribed medicine exactly as directed. °· Avoid any known triggers of your seizures. °· Keep a seizure diary. Record what you recall about any seizure, especially any possible trigger.   °· Make   sure the people you live and work with know that you are prone to seizures. They should receive instructions on how to help you. In general, a witness to a seizure should:   Cushion your head and body.   Turn you on your side.   Avoid unnecessarily restraining you.   Not place anything inside your  mouth.   Call for emergency medical help if there is any question about what has occurred.   Follow up with your health care provider as directed. You may need regular blood tests to monitor the levels of your medicine.  SEEK MEDICAL CARE IF:   You develop signs of infection or other illness. This might increase the risk of a seizure.   You seem to be having more frequent seizures.   Your seizure pattern is changing.  SEEK IMMEDIATE MEDICAL CARE IF:   You have a seizure that does not stop after a few moments.   You have a seizure that causes any difficulty in breathing.   You have a seizure that results in a very severe headache.   You have a seizure that leaves you with the inability to speak or use a part of your body.  Document Released: 04/16/2005 Document Revised: 02/04/2013 Document Reviewed: 11/26/2012 [facility] Patient Information 2015 [place], [place]. This information is not intended to replace advice given to you by your health care provider. Make sure you discuss any questions you have with your health care provider. Nausea and Vomiting Nausea is a sick feeling that often comes before throwing up (vomiting). Vomiting is a reflex where stomach contents come out of your mouth. Vomiting can cause severe loss of body fluids (dehydration). Children and elderly adults can become dehydrated quickly, especially if they also have diarrhea. Nausea and vomiting are symptoms of a condition or disease. It is important to find the cause of your symptoms. CAUSES   Direct irritation of the stomach lining. This irritation can result from increased acid production (gastroesophageal reflux disease), infection, food poisoning, taking certain medicines (such as nonsteroidal anti-inflammatory drugs), alcohol use, or tobacco use.  Signals from the brain.These signals could be caused by a headache, heat exposure, an inner ear disturbance, increased pressure in the brain from injury,  infection, a tumor, or a concussion, pain, emotional stimulus, or metabolic problems.  An obstruction in the gastrointestinal tract (bowel obstruction).  Illnesses such as diabetes, hepatitis, gallbladder problems, appendicitis, kidney problems, cancer, sepsis, atypical symptoms of a heart attack, or eating disorders.  Medical treatments such as chemotherapy and radiation.  Receiving medicine that makes you sleep (general anesthetic) during surgery. DIAGNOSIS Your caregiver may ask for tests to be done if the problems do not improve after a few days. Tests may also be done if symptoms are severe or if the reason for the nausea and vomiting is not clear. Tests may include:  Urine tests.  Blood tests.  Stool tests.  Cultures (to look for evidence of infection).  X-rays or other imaging studies. Test results can help your caregiver make decisions about treatment or the need for additional tests. TREATMENT You need to stay well hydrated. Drink frequently but in small amounts.You may wish to drink water, sports drinks, clear broth, or eat frozen ice pops or gelatin dessert to help stay hydrated.When you eat, eating slowly may help prevent nausea.There are also some antinausea medicines that may help prevent nausea. HOME CARE INSTRUCTIONS   Take all medicine as directed by your caregiver.  If you do not have  an appetite, do not force yourself to eat. However, you must continue to drink fluids.  If you have an appetite, eat a normal diet unless your caregiver tells you differently.  Eat a variety of complex carbohydrates (rice, wheat, potatoes, bread), lean meats, yogurt, fruits, and vegetables.  Avoid high-fat foods because they are more difficult to digest.  Drink enough water and fluids to keep your urine clear or pale yellow.  If you are dehydrated, ask your caregiver for specific rehydration instructions. Signs of dehydration may include:  Severe thirst.  Dry lips and  mouth.  Dizziness.  Dark urine.  Decreasing urine frequency and amount.  Confusion.  Rapid breathing or pulse. SEEK IMMEDIATE MEDICAL CARE IF:   You have blood or brown flecks (like coffee grounds) in your vomit.  You have black or bloody stools.  You have a severe headache or stiff neck.  You are confused.  You have severe abdominal pain.  You have chest pain or trouble breathing.  You do not urinate at least once every 8 hours.  You develop cold or clammy skin.  You continue to vomit for longer than 24 to 48 hours.  You have a fever. MAKE SURE YOU:   Understand these instructions.  Will watch your condition.  Will get help right away if you are not doing well or get worse. Document Released: 04/16/2005 Document Revised: 07/09/2011 Document Reviewed: 09/13/2010 [facility] Patient Information 2015 [place], [place]. This information is not intended to replace advice given to you by your health care provider. Make sure you discuss any questions you have with your health care provider.

## 2014-10-19 NOTE — Telephone Encounter (Signed)
Pt states he is nauseated, and would like to receive a rx for nausea medication; last saw Collene Gobble, MD at 10/06/2014 8:41 AM.

## 2014-10-20 ENCOUNTER — Telehealth: Payer: Self-pay | Admitting: Family Medicine

## 2014-10-20 ENCOUNTER — Other Ambulatory Visit: Payer: Self-pay | Admitting: Family Medicine

## 2014-10-20 DIAGNOSIS — E876 Hypokalemia: Secondary | ICD-10-CM

## 2014-10-20 MED ORDER — POTASSIUM CHLORIDE CRYS ER 20 MEQ PO TBCR: 20.0000 meq | EXTENDED_RELEASE_TABLET | Freq: Every day | ORAL | Status: DC

## 2014-10-20 NOTE — Telephone Encounter (Signed)
Per Dr. Cleta Alberts he spoke with patient earlier this am regarding seizure and N/V last night and his abnormal lab values. Dr. Cleta Alberts instructed him to get Magnesium 400mg  OTC 1 poqd, and sent in Klor Con 1 poqd to pharmacy for him to start today. Also discussed getting appt with Dr. Elnoria Howard today and moving his neurology appt up from 8/01 to sooner.   1) Klor con sent to pharmacy 2) I called Dr. Elnoria Howard office and they can see him today at 1:30. Patient notified and stated he would not be able to go because he was "pretty much bed ridden" and will call his office to reschedule.  3) I called Dr. Karel Jarvis office to see if could get sooner appt than 08/01. They were able to get him scheduled for Friday 06/24 at 3:30pm. I called patient to notify him of his appt, I left voice mail to return call 4) Dr. Cleta Alberts called patient's mother to see if she would be able to take him to appt with Dr. Elnoria Howard today. She stated she was going to head over to his place to pick him up and take him to see Dr. Elnoria Howard today. She said she would call when she got there but as of 12:36 pm we have not received a call from her.

## 2014-10-20 NOTE — Telephone Encounter (Signed)
Called mom. She did go pick up pt and they are on their way to Dr. Haywood Pao office. I let Dr. Cleta Alberts know.  Also let mom know about the neurology appt on Fri.

## 2014-10-20 NOTE — Telephone Encounter (Signed)
Spoke with pt, advised Rx was sent to the pharmacy. 

## 2014-10-21 ENCOUNTER — Ambulatory Visit (INDEPENDENT_AMBULATORY_CARE_PROVIDER_SITE_OTHER): Payer: BLUE CROSS/BLUE SHIELD | Admitting: Neurology

## 2014-10-21 ENCOUNTER — Encounter: Payer: Self-pay | Admitting: Neurology

## 2014-10-21 VITALS — BP 116/80 | HR 76 | Resp 20 | Ht 69.0 in | Wt 156.9 lb

## 2014-10-21 DIAGNOSIS — G40309 Generalized idiopathic epilepsy and epileptic syndromes, not intractable, without status epilepticus: Secondary | ICD-10-CM

## 2014-10-21 MED ORDER — LEVETIRACETAM 1000 MG PO TABS: ORAL_TABLET | ORAL | Status: DC

## 2014-10-21 NOTE — Patient Instructions (Signed)
1. Routine EEG today 2. Continue Keppra 1000mg : Take 1-1/2 tablets twice a day 3. Continue follow-up with your PCP and GI specialist for potassium and liver function  Seizure Precautions: 1. If medication has been prescribed for you to prevent seizures, take it exactly as directed.  Do not stop taking the medicine without talking to your doctor first, even if you have not had a seizure in a long time.   2. Avoid activities in which a seizure would cause danger to yourself or to others.  Don't operate dangerous machinery, swim alone, or climb in high or dangerous places, such as on ladders, roofs, or girders.  Do not drive unless your doctor says you may.  3. If you have any warning that you may have a seizure, lay down in a safe place where you can't hurt yourself.    4.  No driving for 6 months from last seizure, as per [facility].   Please refer to the following link on the [employer]'s website for more information: http://www.epilepsyfoundation.org/answerplace/Social/driving/drivingu.cfm   5.  Maintain good sleep hygiene.  6.  Notify your neurology if you are planning pregnancy or if you become pregnant.  7.  Contact your doctor if you have any problems that may be related to the medicine you are taking.  8.  Call 911 and bring the patient back to the ED if:        A.  The seizure lasts longer than 5 minutes.       B.  The patient doesn't awaken shortly after the seizure  C.  The patient has new problems such as difficulty seeing, speaking or moving  D.  The patient was injured during the seizure  E.  The patient has a temperature over 102 F (39C)  F.  The patient vomited and now is having trouble breathing

## 2014-10-21 NOTE — Progress Notes (Addendum)
NEUROLOGY CONSULTATION NOTE  Eric Mueller MRN: [id number] DOB: Jun 26, 1985  Referring provider: Dr. Lesle Chris Primary care provider: Dr. Lesle Chris  Reason for consult:  seizures  Dear Dr Cleta Alberts:  Thank you for your kind referral of Eric Mueller for consultation of the above symptoms. Although his history is well known to you, please allow me to reiterate it for the purpose of our medical record. Records and images were personally reviewed where available.  HISTORY OF PRESENT ILLNESS: This is a pleasant 29 year old right-handed man with a history of seizures since age 18. The first seizure occurred in the setting of alcohol use, he was drinking almost daily at that time. Following the first seizure, he reported reducing alcohol and going months without, during which seizures continued to occur. He was seen by a neurologist and was initially started on Keppra, with dose increased over the years, most recently at least a year ago to 1500mg  BID. He occasionally has a warning of not feeling right or a quick flash on either the right or left visual field, followed by a loud yell then falling to the ground with tonic activity. Seizures usually last 2 minutes or so. He had bitten his tongue, denies bowel/bladder incontinence. He would feel confused after and has some memory issues for several hours, and feel extreme fatigue and soreness for up to 2-3 days. He reports a total of 7-8 seizures, most recently last 10/19/14. Prior to this, his last seizure was in the winter of last year. He had been feeling generalized malaise and abdominal discomfort 2 days prior, with nausea and vomiting, unable to keep his medications down. He tried taking an additional Keppra dose, recalls having tingling in the last 3 digits of both hands before a witnessed seizure by his mother on 10/19/14 lasting a few minutes. He went to the ER, where he started having cramping and spasms in his hands and feet, he was aware of the  cramps and felt very scared, diagnosed with severe carpopedal spasms in the ER. He was given Ativan and morphine, with relief of symptoms. His CMP was abnormal with low potassium of 2.9 and magnesium 1.1. He was discharged home feeling overall better, and denies any further vomiting since then. He is now on potassium replacement, and has been able to keep Keppra 1500mg  BID down. He still feels a little out of sorts with difficulty swallowing and heartburn from the vomiting, overall no focal weakness/numbness/tingling, headaches, dizziness, diplopia, dysarthria, neck/back pain, bowel/bladder dysfunction. He has a chronic history of sleep problems, and had even less sleep when he was vomiting last week. He has had a history of bilateral hand tremors since age 63 or 7, diagnosed as ?essential tremor, and would notice worsening of shakiness after seizures.   He denies any staring/unresponsive episodes, gaps in time, olfactory/gustatory hallucinations, deja vu, rising epigastric sensation, focal numbness/tingling/weakness, myoclonic jerks. He reports his memory has always been a "B or C+," short-term memory is fine. He reports he has not had any alcohol intake for several months now. He is followed by GI for alcoholic hepatitis and pancreatitis, bloodwork from ER showed AST 159, ALT 72, total bilirubin 2.3. Lipase and amylase normal. CK normal.   Epilepsy Risk Factors: A distant paternal cousin has seizures. Otherwise he had a normal birth and early development.  There is no history of febrile convulsions, CNS infections such as meningitis/encephalitis, significant traumatic brain injury, neurosurgical procedures. He reports a concussion in high school playing  lacrosse, no loss of consciousness.   EEGs: none available for review MRI: none available for review. I personally reviewed head CT done 10/2012 which did not show any acute changes.  Laboratory Data:  Lab Results  Component Value Date   WBC 10.2  10/19/2014   HGB 14.2 10/19/2014   HCT 39.4 10/19/2014   MCV 98.3 10/19/2014   PLT 178 10/19/2014     Chemistry      Component Value Date/Time   NA 142 10/19/2014 1919   K 2.9* 10/19/2014 1919   CL 94* 10/19/2014 1919   CO2 26 10/19/2014 1919   BUN 8 10/19/2014 1919   CREATININE 0.81 10/19/2014 1919   CREATININE 0.74 10/06/2014 0934      Component Value Date/Time   CALCIUM 8.5* 10/19/2014 1919   ALKPHOS 118 10/19/2014 1919   AST 159* 10/19/2014 1919   ALT 72* 10/19/2014 1919   BILITOT 2.3* 10/19/2014 1919     Lab Results  Component Value Date   CKTOTAL 136 10/19/2014     PAST MEDICAL HISTORY: Past Medical History  Diagnosis Date  . Seizure disorder   . Seizures   . Allergy   . Anxiety   . Hepatitis   . GERD (gastroesophageal reflux disease)   . Pancreatitis   . Thrombocytopenia   . Alcoholic liver disease   . Transaminitis     PAST SURGICAL HISTORY: Past Surgical History  Procedure Laterality Date  . Wisdom tooth extraction      MEDICATIONS: Current Outpatient Prescriptions on File Prior to Visit  Medication Sig Dispense Refill  . levETIRAcetam (KEPPRA) 1000 MG tablet Take 1.5 tablets (1,500 mg total) by mouth 2 (two) times daily. 90 tablet 0  . ondansetron (ZOFRAN ODT) 4 MG disintegrating tablet Take 1 tablet (4 mg total) by mouth every 8 (eight) hours as needed for nausea or vomiting. 12 tablet 0  . potassium chloride SA (K-DUR,KLOR-CON) 20 MEQ tablet Take 1 tablet (20 mEq total) by mouth daily. 30 tablet 1  . POTASSIUM PO Take 4 tablets by mouth daily.     No current facility-administered medications on file prior to visit.    ALLERGIES: Allergies  Allergen Reactions  . Oxycodone Nausea And Vomiting    FAMILY HISTORY: Family History  Problem Relation Age of Onset  . Heart disease      cardiovascular disease  . Cancer Paternal Grandmother     lung  . Hypertension Father     SOCIAL HISTORY: History   Social History  . Marital Status:  Single    Spouse Name: n/a  . Number of Children: 0  . Years of Education: N/A   Occupational History  . musician     session work; especially percussion  .     Social History Main Topics  . Smoking status: Never Smoker   . Smokeless tobacco: Never Used  . Alcohol Use: No     Comment: no EtOH since 01/13/2013  . Drug Use: No  . Sexual Activity: Yes   Other Topics Concern  . Not on file   Social History Narrative   Lives with his mother. Father lives nearby.    REVIEW OF SYSTEMS: Constitutional: No fevers, chills, or sweats, no generalized fatigue, change in appetite Eyes: No visual changes, double vision, eye pain Ear, nose and throat: No hearing loss, ear pain, nasal congestion, sore throat Cardiovascular: No chest pain, palpitations Respiratory:  No shortness of breath at rest or with exertion, wheezes GastrointestinaI: No nausea, vomiting (  resolved), diarrhea, abdominal pain, fecal incontinence Genitourinary:  No dysuria, urinary retention or frequency Musculoskeletal:  No neck pain, back pain Integumentary: No rash, pruritus, skin lesions Neurological: as above Psychiatric: No depression, insomnia, anxiety Endocrine: No palpitations, fatigue, diaphoresis, mood swings, change in appetite, change in weight, increased thirst Hematologic/Lymphatic:  No anemia, purpura, petechiae. Allergic/Immunologic: no itchy/runny eyes, nasal congestion, recent allergic reactions, rashes  PHYSICAL EXAM: Filed Vitals:   10/21/14 0839  BP: 116/80  Pulse: 76  Resp: 20   General: No acute distress Head:  Normocephalic/atraumatic Eyes: Fundoscopic exam shows bilateral sharp discs, no vessel changes, exudates, or hemorrhages Neck: supple, no paraspinal tenderness, full range of motion Back: No paraspinal tenderness Heart: regular rate and rhythm Lungs: Clear to auscultation bilaterally. Vascular: No carotid bruits. Skin/Extremities: No rash, no edema Neurological Exam: Mental  status: alert and oriented to person, place, and time, no dysarthria or aphasia, Fund of knowledge is appropriate.  Recent and remote memory are intact.  Attention and concentration are normal.    Able to name objects and repeat phrases. Cranial nerves: CN I: not tested CN II: pupils equal, round and reactive to light, visual fields intact, fundi unremarkable. CN III, IV, VI:  full range of motion, no nystagmus, no ptosis CN V: facial sensation intact CN VII: upper and lower face symmetric CN VIII: hearing intact to finger rub CN IX, X: gag intact, uvula midline CN XI: sternocleidomastoid and trapezius muscles intact CN XII: tongue midline Bulk & Tone: normal, no fasciculations. Motor: 5/5 throughout with no pronator drift. Sensation: intact to light touch, cold, pin, vibration and joint position sense.  No extinction to double simultaneous stimulation.  Romberg test negative Deep Tendon Reflexes: +2 throughout, no ankle clonus Plantar responses: downgoing bilaterally Cerebellar: no incoordination on finger to nose, heel to shin. No dysdiadochokinesia Gait: narrow-based and steady, able to tandem walk adequately. Tremor: mild bilateral high frequency low amplitude postural and endpoint tremor, L>R  IMPRESSION: This is a pleasant 29 year old right-handed man with a history of recurrent seizures since age 86. Initially seizures occurred in the setting of alcohol use, however he denies any alcohol at least with the most recent seizure on 10/19/14. He was however having vomiting at that time, unable to keep medications down. Prior to this, his last seizure was more than 6 months ago. He will continue on Keppra  BID for now. 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 Routine EEG will be ordered today to further classify his seizures. We discussed the jitteriness and  anxiety he is having, he is interested in seeing Behavioral health, we also discussed that Keppra could potentially be contributing to this. He is interested in switching to a different AED in the future, considerations for AEDs that are not extensively metabolized by the liver include Gabapentin, Topamax. We will discuss these options on his next visit. We discussed avoidance of seizure triggers, including alcohol, sleep deprivation, and missed medications. He indicates poor sleep and will try melatonin once cleared with his GI specialist. [place] driving laws were discussed with the patient, and he knows to stop driving after a seizure, until 6 months seizure-free. He will follow-up in 2 months.   Thank you for allowing me to participate in the care of this patient. Please do not hesitate to call for any questions or concerns.   Eric Mueller, M.D.  CC: Dr. Cleta Alberts

## 2014-10-22 ENCOUNTER — Ambulatory Visit: Payer: Self-pay | Admitting: Neurology

## 2014-10-25 ENCOUNTER — Telehealth: Payer: Self-pay | Admitting: Family Medicine

## 2014-10-25 NOTE — Telephone Encounter (Signed)
Lmovm to return my call. 

## 2014-10-25 NOTE — Telephone Encounter (Signed)
Patient returned my call. I notified him of results & advisement.

## 2014-10-25 NOTE — Telephone Encounter (Signed)
-----   Message from Van Clines, MD sent at 10/25/2014  3:00 PM EDT ----- Pls let patient know EEG is normal, continue current dose of medication. Thanks

## 2014-10-25 NOTE — Procedures (Signed)
ELECTROENCEPHALOGRAM REPORT  Date of Study: 10/21/2014  Patient's Name: [name] MRN: [id number] Date of Birth: 1985-10-18  Referring Provider: Dr. Patrcia DollyKaren Aquino   Clinical History: This is a 29 year old man with a history of recurrent seizures since age 29. Initially seizures occurred in the setting of alcohol use, however he denies any alcohol at least with the most recent seizure on 10/19/14  Medications: Keppra  Technical Summary: A multichannel digital EEG recording measured by the international 10-20 system with electrodes applied with paste and impedances below 5000 ohms performed in our laboratory with EKG monitoring in an awake and asleep patient.  Hyperventilation and photic stimulation were performed.  The digital EEG was referentially recorded, reformatted, and digitally filtered in a variety of bipolar and referential montages for optimal display.    Description: The patient is awake and asleep during the recording.  During maximal wakefulness, there is a symmetric, medium voltage 10 Hz posterior dominant rhythm that attenuates with eye opening.  The record is symmetric.  During drowsiness and stage 1 sleep, there is an increase in theta slowing of the background with vertex waves seen.  Hyperventilation and photic stimulation did not elicit any abnormalities.  There were no epileptiform discharges or electrographic seizures seen.    EKG lead was unremarkable.  Impression: This awake and asleep EEG is normal.    Clinical Correlation: A normal EEG does not exclude a clinical diagnosis of epilepsy.  If further clinical questions remain, prolonged EEG may be helpful.  Clinical correlation is advised.   Patrcia DollyKaren Aquino, M.D.

## 2014-11-26 ENCOUNTER — Emergency Department (HOSPITAL_COMMUNITY)
Admission: EM | Admit: 2014-11-26 | Discharge: 2014-11-27 | Disposition: A | Discharge status: Home / Self Care | Payer: BLUE CROSS/BLUE SHIELD | Attending: Emergency Medicine | Admitting: Emergency Medicine

## 2014-11-26 ENCOUNTER — Encounter (HOSPITAL_COMMUNITY): Payer: Self-pay

## 2014-11-26 DIAGNOSIS — Z8719 Personal history of other diseases of the digestive system: Secondary | ICD-10-CM | POA: Diagnosis not present

## 2014-11-26 DIAGNOSIS — Z8659 Personal history of other mental and behavioral disorders: Secondary | ICD-10-CM | POA: Insufficient documentation

## 2014-11-26 DIAGNOSIS — Z862 Personal history of diseases of the blood and blood-forming organs and certain disorders involving the immune mechanism: Secondary | ICD-10-CM | POA: Diagnosis not present

## 2014-11-26 DIAGNOSIS — G40909 Epilepsy, unspecified, not intractable, without status epilepticus: Secondary | ICD-10-CM | POA: Insufficient documentation

## 2014-11-26 DIAGNOSIS — Z79899 Other long term (current) drug therapy: Secondary | ICD-10-CM | POA: Diagnosis not present

## 2014-11-26 DIAGNOSIS — R112 Nausea with vomiting, unspecified: Secondary | ICD-10-CM | POA: Diagnosis not present

## 2014-11-26 DIAGNOSIS — R000 Tachycardia, unspecified: Secondary | ICD-10-CM | POA: Diagnosis not present

## 2014-11-26 DIAGNOSIS — R1010 Upper abdominal pain, unspecified: Secondary | ICD-10-CM | POA: Diagnosis not present

## 2014-11-26 LAB — CBC
HCT: 40.3 % (ref 39.0–52.0)
Hemoglobin: 14.5 g/dL (ref 13.0–17.0)
MCH: 36 pg — AB (ref 26.0–34.0)
MCHC: 36 g/dL (ref 30.0–36.0)
MCV: 100 fL (ref 78.0–100.0)
Platelets: 136 10*3/uL — ABNORMAL LOW (ref 150–400)
RBC: 4.03 MIL/uL — ABNORMAL LOW (ref 4.22–5.81)
RDW: 12.3 % (ref 11.5–15.5)
WBC: 8.6 10*3/uL (ref 4.0–10.5)

## 2014-11-26 LAB — COMPREHENSIVE METABOLIC PANEL
ALT: 84 U/L — AB (ref 17–63)
AST: 166 U/L — AB (ref 15–41)
Albumin: 4.8 g/dL (ref 3.5–5.0)
Alkaline Phosphatase: 115 U/L (ref 38–126)
Anion gap: 17 — ABNORMAL HIGH (ref 5–15)
BILIRUBIN TOTAL: 2.9 mg/dL — AB (ref 0.3–1.2)
BUN: 7 mg/dL (ref 6–20)
CHLORIDE: 92 mmol/L — AB (ref 101–111)
CO2: 29 mmol/L (ref 22–32)
CREATININE: 0.81 mg/dL (ref 0.61–1.24)
Calcium: 9.4 mg/dL (ref 8.9–10.3)
GFR calc non Af Amer: >60 mL/min (ref >60)
Glucose, Bld: 123 mg/dL — ABNORMAL HIGH (ref 65–99)
POTASSIUM: 3.5 mmol/L (ref 3.5–5.1)
SODIUM: 138 mmol/L (ref 135–145)
Total Protein: 8.5 g/dL — ABNORMAL HIGH (ref 6.5–8.1)

## 2014-11-26 LAB — URINALYSIS, ROUTINE W REFLEX MICROSCOPIC
Bilirubin Urine: NEGATIVE
GLUCOSE, UA: NEGATIVE mg/dL
HGB URINE DIPSTICK: NEGATIVE
KETONES UR: NEGATIVE mg/dL
LEUKOCYTES UA: NEGATIVE
NITRITE: NEGATIVE
Protein, ur: 100 mg/dL — AB
SPECIFIC GRAVITY, URINE: 1.023 (ref 1.005–1.030)
Urobilinogen, UA: 1 mg/dL (ref 0.0–1.0)
pH: 8.5 — ABNORMAL HIGH (ref 5.0–8.0)

## 2014-11-26 LAB — LIPASE, BLOOD: Lipase: 13 U/L — ABNORMAL LOW (ref 22–51)

## 2014-11-26 LAB — URINE MICROSCOPIC-ADD ON

## 2014-11-26 LAB — AMMONIA: Ammonia: 41 umol/L — ABNORMAL HIGH (ref 9–35)

## 2014-11-26 MED ORDER — ONDANSETRON HCL 4 MG/2ML IJ SOLN
4.0000 mg | Freq: Once | INTRAMUSCULAR | Status: AC
  Administered 2014-11-26: 4 mg via INTRAVENOUS
  Filled 2014-11-26: qty 2

## 2014-11-26 MED ORDER — MORPHINE SULFATE 4 MG/ML IJ SOLN
4.0000 mg | Freq: Once | INTRAMUSCULAR | Status: AC
  Administered 2014-11-26: 4 mg via INTRAVENOUS
  Filled 2014-11-26: qty 1

## 2014-11-26 MED ORDER — LORAZEPAM 2 MG/ML IJ SOLN
1.0000 mg | Freq: Once | INTRAMUSCULAR | Status: AC
  Administered 2014-11-26: 1 mg via INTRAVENOUS
  Filled 2014-11-26: qty 1

## 2014-11-26 MED ORDER — DICYCLOMINE HCL 10 MG/ML IM SOLN
20.0000 mg | Freq: Once | INTRAMUSCULAR | Status: AC
  Administered 2014-11-26: 20 mg via INTRAMUSCULAR
  Filled 2014-11-26: qty 2

## 2014-11-26 MED ORDER — SODIUM CHLORIDE 0.9 % IV BOLUS (SEPSIS)
1000.0000 mL | Freq: Once | INTRAVENOUS | Status: AC
Start: 2014-11-26 — End: 2014-11-26
  Administered 2014-11-26: 1000 mL via INTRAVENOUS

## 2014-11-26 MED ORDER — GI COCKTAIL ~~LOC~~
30.0000 mL | Freq: Once | ORAL | Status: AC
  Administered 2014-11-26: 30 mL via ORAL
  Filled 2014-11-26: qty 30

## 2014-11-26 MED ORDER — LEVETIRACETAM 750 MG PO TABS
1500.0000 mg | ORAL_TABLET | Freq: Once | ORAL | Status: AC
  Administered 2014-11-26: 1500 mg via ORAL
  Filled 2014-11-26: qty 2

## 2014-11-26 NOTE — ED Notes (Addendum)
Per pt, started having RUQ abdominal pain today.  N/V.  Hx of pancreatitis but denies drinking.  Pt very anxious of assessment.  Tremor noted to hands.  Pt states he has had tremors since he was 11.  Does not give a date to last alcohol abuse but states he is in recovery.  No fever.  No change in urination.

## 2014-11-26 NOTE — ED Notes (Signed)
Per EMS pt from work started having n/v with left upper quad abdominal pain. Pt hx of pancreatitis. BP 130/90 HR 92 100% O2.

## 2014-11-26 NOTE — ED Provider Notes (Signed)
CSN: [id number]     Arrival date & time 11/26/14  1744 History   First MD Initiated Contact with Patient 11/26/14 2016     Chief Complaint  Patient presents with  . Emesis  . Abdominal Pain     (Consider location/radiation/quality/duration/timing/severity/associated sxs/prior Treatment) HPI  29 year old male with history of pancreatitis, hepatitis, seizures disorder, alcohol abuse presenting for evaluation of abdominal pain.patient report this morning he was having several bouts of coughing fit. Follows with having chest pressure and abdominal pressure. Subsequently patient felt hot, feeling nauseous and has had multiple bouts of nonbloody nonbilious emesis. Patient felt symptoms reminds him of alcohol withdrawal. He has not drank alcohol for the past several days and decided strength 1 bottle of alcohol to help ease his anxiety. His nausea and vomiting persist throughout the day. He has not been able to take his medication.he was concerning for possible seizure and decided to come to the ER for further evaluation. Patient states he has been trying to quit drinking and has been attending AA meeting. He has been taking his medication as prescribed. He has not had a recent seizure. He did not take his nightly dose of Keppra today and was concerned. His abdominal discomfort has since improved without any specific treatment. He feels tremulous but states this is chronic.last bowel movement was this morning and was normal.  Past Medical History  Diagnosis Date  . Seizure disorder   . Seizures   . Allergy   . Anxiety   . Hepatitis   . GERD (gastroesophageal reflux disease)   . Pancreatitis   . Thrombocytopenia   . Alcoholic liver disease   . Transaminitis    Past Surgical History  Procedure Laterality Date  . Wisdom tooth extraction     Family History  Problem Relation Age of Onset  . Heart disease      cardiovascular disease  . Cancer Paternal Grandmother     lung  . Hypertension  Father    History  Substance Use Topics  . Smoking status: Never Smoker   . Smokeless tobacco: Never Used  . Alcohol Use: No     Comment: no EtOH since 01/13/2013    Review of Systems  All other systems reviewed and are negative.     Allergies  Oxycodone  Home Medications   Prior to Admission medications   Medication Sig Start Date End Date Taking? Authorizing Provider  levETIRAcetam (KEPPRA) 1000 MG tablet Take 1-1/2 tablets twice a day Patient taking differently: Take 1,500 mg by mouth 2 (two) times daily.  10/21/14  Yes Van Clines, MD  ondansetron (ZOFRAN ODT) 4 MG disintegrating tablet Take 1 tablet (4 mg total) by mouth every 8 (eight) hours as needed for nausea or vomiting. 10/19/14  Yes Collene Gobble, MD  potassium chloride SA (K-DUR,KLOR-CON) 20 MEQ tablet Take 1 tablet (20 mEq total) by mouth daily. Patient taking differently: Take 20 mEq by mouth 2 (two) times daily.  10/20/14  Yes Collene Gobble, MD   BP 135/94 mmHg  Pulse 103  Temp(Src) 98.2 F (36.8 C) (Oral)  Resp 20  SpO2 100% Physical Exam  Constitutional: He is oriented to person, place, and time. He appears well-developed and well-nourished. No distress.  Caucasian male laying in bed, tremulous and mildly uncomfortable but nontoxic.  HENT:  Head: Atraumatic.  Mouth/Throat: Oropharynx is clear and moist.  Eyes: Conjunctivae are normal.  Neck: Neck supple.  Cardiovascular:  Mildly tachycardic without murmurs rubs or gallops  Pulmonary/Chest: Effort normal and breath sounds normal. No respiratory distress. He has no wheezes. He has no rales. He exhibits no tenderness.  Abdominal: Soft. Bowel sounds are normal. He exhibits no distension. There is no rebound and no guarding.  Neurological: He is alert and oriented to person, place, and time.  Mild intentional tremors  Skin: No rash noted.  Psychiatric: He has a normal mood and affect.  Nursing note and vitals reviewed.   ED Course  Procedures  (including critical care time)  9:21 PM Patient presents concerning of alcohol withdrawal and potential seizure because he had persistent nausea and vomiting with upper abdominal pain. On examination he has no significant abdominal discomfort concerning for acute hepatitis. Heart and lung sounds normal.he is mentating appropriately.  Labs demonstrate evidence of transaminitis, at baseline. Normal lipase and normal WBC. I do not think patient has active pancreatitis. He appears anxious. Plan to treat symptoms and monitor closely. Do not think advanced imaging is indicated at this time.  10:59 PM Patient appears to be more comfortable. Labs are reassuring. He does endorse some mild indigestion without any significant pain. We'll continue treatment.  12:27 AM Pt felt better after treatment, stable for discharge.  He will f/u with his PCP and GI specialist for further care.  Recommend alcohol cessation.  Suspect gastritis causing his sxs.    Labs Review Labs Reviewed  LIPASE, BLOOD - Abnormal; Notable for the following:    Lipase 13 (*)    All other components within normal limits  COMPREHENSIVE METABOLIC PANEL - Abnormal; Notable for the following:    Chloride 92 (*)    Glucose, Bld 123 (*)    Total Protein 8.5 (*)    AST 166 (*)    ALT 84 (*)    Total Bilirubin 2.9 (*)    Anion gap 17 (*)    All other components within normal limits  CBC - Abnormal; Notable for the following:    RBC 4.03 (*)    MCH 36.0 (*)    Platelets 136 (*)    All other components within normal limits  URINALYSIS, ROUTINE W REFLEX MICROSCOPIC (NOT AT [facility]) - Abnormal; Notable for the following:    Color, Urine AMBER (*)    pH 8.5 (*)    Protein, ur 100 (*)    All other components within normal limits  AMMONIA - Abnormal; Notable for the following:    Ammonia 41 (*)    All other components within normal limits  URINE MICROSCOPIC-ADD ON    Imaging Review No results found.   EKG Interpretation None       MDM   Final diagnoses:  Upper abdominal pain  Non-intractable vomiting with nausea, vomiting of unspecified type    BP 133/79 mmHg  Pulse 80  Temp(Src) 98.2 F (36.8 C) (Oral)  Resp 18  SpO2 95%      Fayrene Helper, PA-C 11/27/14 0028  Zadie Rhine, MD 11/29/14 1140

## 2014-11-27 MED ORDER — RANITIDINE HCL 150 MG PO CAPS: 150.0000 mg | ORAL_CAPSULE | Freq: Every day | ORAL | Status: DC

## 2014-11-27 MED ORDER — OMEPRAZOLE 20 MG PO CPDR: 20.0000 mg | DELAYED_RELEASE_CAPSULE | Freq: Every day | ORAL | Status: DC

## 2014-11-27 NOTE — Discharge Instructions (Signed)
Please follow up with your doctor or GI specialist for further management of your condition.  Avoid alcohol.  Take prilosec and zantac as prescribed.

## 2014-11-29 ENCOUNTER — Ambulatory Visit: Payer: Self-pay | Admitting: Neurology

## 2014-12-15 ENCOUNTER — Other Ambulatory Visit: Payer: Self-pay | Admitting: Emergency Medicine

## 2014-12-15 ENCOUNTER — Other Ambulatory Visit: Payer: Self-pay

## 2014-12-15 DIAGNOSIS — R112 Nausea with vomiting, unspecified: Secondary | ICD-10-CM

## 2014-12-15 MED ORDER — ONDANSETRON 4 MG PO TBDP: 4.0000 mg | ORAL_TABLET | Freq: Three times a day (TID) | ORAL | Status: DC | PRN

## 2014-12-15 NOTE — Telephone Encounter (Signed)
Pharm reqs Rf of zofran for pt. OK to give RFs or need to RTC?

## 2014-12-22 ENCOUNTER — Ambulatory Visit: Payer: Self-pay | Admitting: Neurology

## 2014-12-28 ENCOUNTER — Other Ambulatory Visit: Payer: Self-pay | Admitting: Emergency Medicine

## 2015-01-03 ENCOUNTER — Emergency Department (HOSPITAL_COMMUNITY)
Admission: EM | Admit: 2015-01-03 | Discharge: 2015-01-03 | Disposition: A | Discharge status: Home / Self Care | Payer: BLUE CROSS/BLUE SHIELD | Attending: Emergency Medicine | Admitting: Emergency Medicine

## 2015-01-03 ENCOUNTER — Telehealth: Payer: Self-pay | Admitting: Emergency Medicine

## 2015-01-03 ENCOUNTER — Encounter (HOSPITAL_COMMUNITY): Payer: Self-pay | Admitting: Emergency Medicine

## 2015-01-03 DIAGNOSIS — Z8659 Personal history of other mental and behavioral disorders: Secondary | ICD-10-CM | POA: Diagnosis not present

## 2015-01-03 DIAGNOSIS — S0081XA Abrasion of other part of head, initial encounter: Secondary | ICD-10-CM | POA: Insufficient documentation

## 2015-01-03 DIAGNOSIS — Y9289 Other specified places as the place of occurrence of the external cause: Secondary | ICD-10-CM | POA: Diagnosis not present

## 2015-01-03 DIAGNOSIS — G40909 Epilepsy, unspecified, not intractable, without status epilepticus: Secondary | ICD-10-CM | POA: Diagnosis not present

## 2015-01-03 DIAGNOSIS — Y9389 Activity, other specified: Secondary | ICD-10-CM | POA: Diagnosis not present

## 2015-01-03 DIAGNOSIS — W1839XA Other fall on same level, initial encounter: Secondary | ICD-10-CM | POA: Diagnosis not present

## 2015-01-03 DIAGNOSIS — Z862 Personal history of diseases of the blood and blood-forming organs and certain disorders involving the immune mechanism: Secondary | ICD-10-CM | POA: Insufficient documentation

## 2015-01-03 DIAGNOSIS — Z79899 Other long term (current) drug therapy: Secondary | ICD-10-CM | POA: Diagnosis not present

## 2015-01-03 DIAGNOSIS — Z87898 Personal history of other specified conditions: Secondary | ICD-10-CM

## 2015-01-03 DIAGNOSIS — K219 Gastro-esophageal reflux disease without esophagitis: Secondary | ICD-10-CM | POA: Diagnosis not present

## 2015-01-03 DIAGNOSIS — R569 Unspecified convulsions: Secondary | ICD-10-CM | POA: Diagnosis present

## 2015-01-03 DIAGNOSIS — Y998 Other external cause status: Secondary | ICD-10-CM | POA: Insufficient documentation

## 2015-01-03 LAB — COMPREHENSIVE METABOLIC PANEL
ALT: 48 U/L (ref 17–63)
AST: 101 U/L — AB (ref 15–41)
Albumin: 4.1 g/dL (ref 3.5–5.0)
Alkaline Phosphatase: 102 U/L (ref 38–126)
Anion gap: 11 (ref 5–15)
BUN: 7 mg/dL (ref 6–20)
CHLORIDE: 104 mmol/L (ref 101–111)
CO2: 25 mmol/L (ref 22–32)
CREATININE: 0.7 mg/dL (ref 0.61–1.24)
Calcium: 8.2 mg/dL — ABNORMAL LOW (ref 8.9–10.3)
GFR calc Af Amer: >60 mL/min (ref >60)
GLUCOSE: 160 mg/dL — AB (ref 65–99)
Potassium: 3.4 mmol/L — ABNORMAL LOW (ref 3.5–5.1)
Sodium: 140 mmol/L (ref 135–145)
Total Bilirubin: 1.7 mg/dL — ABNORMAL HIGH (ref 0.3–1.2)
Total Protein: 7 g/dL (ref 6.5–8.1)

## 2015-01-03 LAB — ETHANOL

## 2015-01-03 LAB — CBC WITH DIFFERENTIAL/PLATELET
Basophils Absolute: 0 10*3/uL (ref 0.0–0.1)
Basophils Relative: 0 % (ref 0–1)
EOS ABS: 0 10*3/uL (ref 0.0–0.7)
Eosinophils Relative: 0 % (ref 0–5)
HCT: 37.1 % — ABNORMAL LOW (ref 39.0–52.0)
Hemoglobin: 13.1 g/dL (ref 13.0–17.0)
LYMPHS ABS: 0.7 10*3/uL (ref 0.7–4.0)
LYMPHS PCT: 6 % — AB (ref 12–46)
MCH: 35 pg — AB (ref 26.0–34.0)
MCHC: 35.3 g/dL (ref 30.0–36.0)
MCV: 99.2 fL (ref 78.0–100.0)
Monocytes Absolute: 0.6 10*3/uL (ref 0.1–1.0)
Monocytes Relative: 6 % (ref 3–12)
NEUTROS PCT: 88 % — AB (ref 43–77)
Neutro Abs: 10.2 10*3/uL — ABNORMAL HIGH (ref 1.7–7.7)
Platelets: 170 10*3/uL (ref 150–400)
RBC: 3.74 MIL/uL — ABNORMAL LOW (ref 4.22–5.81)
RDW: 12.4 % (ref 11.5–15.5)
WBC: 11.6 10*3/uL — AB (ref 4.0–10.5)

## 2015-01-03 LAB — RAPID URINE DRUG SCREEN, HOSP PERFORMED
Amphetamines: NOT DETECTED
BARBITURATES: NOT DETECTED
Benzodiazepines: NOT DETECTED
Cocaine: NOT DETECTED
OPIATES: NOT DETECTED
TETRAHYDROCANNABINOL: NOT DETECTED

## 2015-01-03 LAB — CBG MONITORING, ED: Glucose-Capillary: 165 mg/dL — ABNORMAL HIGH (ref 65–99)

## 2015-01-03 MED ORDER — IBUPROFEN 600 MG PO TABS: 600.0000 mg | ORAL_TABLET | Freq: Four times a day (QID) | ORAL | Status: AC | PRN

## 2015-01-03 MED ORDER — LORAZEPAM 2 MG/ML IJ SOLN
1.0000 mg | Freq: Once | INTRAMUSCULAR | Status: AC
  Administered 2015-01-03: 1 mg via INTRAVENOUS
  Filled 2015-01-03: qty 1

## 2015-01-03 MED ORDER — SODIUM CHLORIDE 0.9 % IV BOLUS (SEPSIS)
1000.0000 mL | Freq: Once | INTRAVENOUS | Status: AC
  Administered 2015-01-03: 1000 mL via INTRAVENOUS

## 2015-01-03 NOTE — ED Provider Notes (Signed)
CSN: [id number]     Arrival date & time 01/03/15  0009 History   First MD Initiated Contact with Patient 01/03/15 0014     Chief Complaint  Patient presents with  . Seizures  . Detox      (Consider location/radiation/quality/duration/timing/severity/associated sxs/prior Treatment) HPI Comments: Patient with a history of seizures on Keppra, alcohol dependence, hepatitis, pancreatitis presents with unwitnessed seizure last night. Per EMS, he was at his mother's house and went outside where he apparently was found on the ground in a post-ictal state. Per EMS, there was no tongue trauma, bleeding, vomiting or incontinence. The patient is an unreliable historian due to confusion presumed to be post-ictal, but denies recent alcohol use.  Patient is a 29 y.o. male presenting with seizures. The history is provided by the patient and the EMS personnel. No language interpreter was used.  Seizures Seizure activity on arrival: no   Postictal symptoms: confusion and somnolence   Return to baseline: no   Recent head injury:  No recent head injuries   Past Medical History  Diagnosis Date  . Seizure disorder   . Seizures   . Allergy   . Anxiety   . Hepatitis   . GERD (gastroesophageal reflux disease)   . Pancreatitis   . Thrombocytopenia   . Alcoholic liver disease   . Transaminitis    Past Surgical History  Procedure Laterality Date  . Wisdom tooth extraction     Family History  Problem Relation Age of Onset  . Heart disease      cardiovascular disease  . Cancer Paternal Grandmother     lung  . Hypertension Father    Social History  Substance Use Topics  . Smoking status: Never Smoker   . Smokeless tobacco: Never Used  . Alcohol Use: No     Comment: no EtOH since 01/13/2013    Review of Systems  Unable to perform ROS: Mental status change  Neurological: Positive for seizures.      Allergies  Oxycodone  Home Medications   Prior to Admission medications   Medication  Sig Start Date End Date Taking? Authorizing Provider  levETIRAcetam (KEPPRA) 1000 MG tablet Take 1-1/2 tablets twice a day Patient taking differently: Take 1,500 mg by mouth 2 (two) times daily.  10/21/14  Yes Van Clines, MD  omeprazole (PRILOSEC) 20 MG capsule Take 1 capsule (20 mg total) by mouth daily. Patient not taking: Reported on 01/03/2015 11/27/14   Fayrene Helper, PA-C  ondansetron (ZOFRAN ODT) 4 MG disintegrating tablet Take 1 tablet (4 mg total) by mouth every 8 (eight) hours as needed for nausea or vomiting. Patient not taking: Reported on 01/03/2015 12/15/14   Collene Gobble, MD  potassium chloride SA (K-DUR,KLOR-CON) 20 MEQ tablet TAKE 1 TABLET (20 MEQ TOTAL) BY MOUTH DAILY. Patient not taking: Reported on 01/03/2015 12/29/14   Collene Gobble, MD  ranitidine (ZANTAC) 150 MG capsule Take 1 capsule (150 mg total) by mouth daily. Patient not taking: Reported on 01/03/2015 11/27/14   Fayrene Helper, PA-C   BP 97/60 mmHg  Pulse 72  Temp(Src) 98.3 F (36.8 C) (Oral)  Resp 16  SpO2 100% Physical Exam  Constitutional: He appears well-developed and well-nourished. No distress.  HENT:  Head: Normocephalic.  Right forehead abrasion without hematoma.   Eyes: Pupils are equal, round, and reactive to light.  Neck: Normal range of motion. Neck supple.  Cardiovascular: Normal rate.   No murmur heard. Pulmonary/Chest: Effort normal. He has no wheezes. He  has no rales. He exhibits no tenderness.  Abdominal: Soft. There is no tenderness.  Musculoskeletal: Normal range of motion.  Neurological:  The patient is confused, somnolent, tremulous.   Skin: Skin is warm and dry.    ED Course  Procedures (including critical care time) Labs Review Labs Reviewed  CBC WITH DIFFERENTIAL/PLATELET - Abnormal; Notable for the following:    WBC 11.6 (*)    RBC 3.74 (*)    HCT 37.1 (*)    MCH 35.0 (*)    Neutrophils Relative % 88 (*)    Neutro Abs 10.2 (*)    Lymphocytes Relative 6 (*)    All other components  within normal limits  COMPREHENSIVE METABOLIC PANEL - Abnormal; Notable for the following:    Potassium 3.4 (*)    Glucose, Bld 160 (*)    Calcium 8.2 (*)    AST 101 (*)    Total Bilirubin 1.7 (*)    All other components within normal limits  CBG MONITORING, ED - Abnormal; Notable for the following:    Glucose-Capillary 165 (*)    All other components within normal limits  ETHANOL  URINE RAPID DRUG SCREEN, HOSP PERFORMED    Imaging Review No results found. I have personally reviewed and evaluated these images and lab results as part of my medical decision-making.   EKG Interpretation None      MDM   Final diagnoses:  None    1. Seizure 2. Seizure disorder history 3. Confusion  Will monitor over observation period and reassess for persistent confusion, vomiting.  5:30:  Patient easily awaken. He is oriented. Is able to reports seizure last night. No alcohol use. States he last drank 2 weeks ago. Was sober after inpatient treatment for 1 1/2 year before resuming alcohol use. He declines detox and states he will pursue outpatient treatment options. Not suicidal or homicidal. VSS. He can be discharged home.    Elpidio Anis, PA-C 01/03/15 1610  Zadie Rhine, MD 01/03/15 [phone]

## 2015-01-03 NOTE — Telephone Encounter (Signed)
Spoke with pt, advised message from Dr. Daub. Pt understood. 

## 2015-01-03 NOTE — ED Provider Notes (Signed)
Patient seen/examined in the Emergency Department in conjunction with Midlevel Provider  Patient presents after seizure.  He has h/o seizures Exam : awake/alert but he is confused.  He has abrasion to right temple Plan: monitor in the ED.  Labs pending Defer CT head unless pt begins to vomit, has worsened HA or acute neuro changes    Zadie Rhine, MD 01/03/15 [phone]

## 2015-01-03 NOTE — ED Notes (Addendum)
Pt bib EMS. Pt hx of seizure.  Pt didn't take his Keppra today. Pt was at his mother's house today and went outside and had a seizure. Seizure was unwitnessed and a abrasion found on pt's left forehead along with bilateral abrasions on pt's hands and knees. EMS did not note any tongue injury or incontinence.  Pt's mother told EMS that she suspected that the pt had been drinking the evening before. Prior to last night, pt had not been drinking for the past 6 months. Pt also requesting detox. On arrival to ED, pt is a/o x 4. Pt ambulatory. Pt got  of zofran IV with EMS.

## 2015-01-03 NOTE — Telephone Encounter (Signed)
call patient and be sure he notifies his neurologist of his recent seizure.

## 2015-01-03 NOTE — ED Notes (Signed)
Bed: ZO10 Expected date:  Expected time:  Means of arrival:  Comments: EMS seizure wants alcohol detox

## 2015-01-03 NOTE — Discharge Instructions (Signed)
Seizure, Adult A seizure is abnormal electrical activity in the brain. Seizures usually last from 30 seconds to 2 minutes. There are various types of seizures. Before a seizure, you may have a warning sensation (aura) that a seizure is about to occur. An aura may include the following symptoms:   Fear or anxiety.  Nausea.  Feeling like the room is spinning (vertigo).  Vision changes, such as seeing flashing lights or spots. Common symptoms during a seizure include:  A change in attention or behavior (altered mental status).  Convulsions with rhythmic jerking movements.  Drooling.  Rapid eye movements.  Grunting.  Loss of bladder and bowel control.  Bitter taste in the mouth.  Tongue biting. After a seizure, you may feel confused and sleepy. You may also have an injury resulting from convulsions during the seizure. HOME CARE INSTRUCTIONS   If you are given medicines, take them exactly as prescribed by your health care provider.  Keep all follow-up appointments as directed by your health care provider.  Do not swim or drive or engage in risky activity during which a seizure could cause further injury to you or others until your health care provider says it is OK.  Get adequate rest.  Teach friends and family what to do if you have a seizure. They should:  Lay you on the ground to prevent a fall.  Put a cushion under your head.  Loosen any tight clothing around your neck.  Turn you on your side. If vomiting occurs, this helps keep your airway clear.  Stay with you until you recover.  Know whether or not you need emergency care. SEEK IMMEDIATE MEDICAL CARE IF:  The seizure lasts longer than 5 minutes.  The seizure is severe or you do not wake up immediately after the seizure.  You have an altered mental status after the seizure.  You are having more frequent or worsening seizures. Someone should drive you to the emergency department or call local emergency  services (911 in [place] MAKE SURE YOU:  Understand these instructions.  Will watch your condition.  Will get help right away if you are not doing well or get worse. Document Released: 04/13/2000 Document Revised: 02/04/2013 Document Reviewed: 11/26/2012 [facility] Patient Information 2015 [place], [place]. This information is not intended to replace advice given to you by your health care provider. Make sure you discuss any questions you have with your health care provider. Substance Abuse Treatment Programs  Intensive Outpatient Programs [facility]     [address]      [place], [place]                   [phone]       [facility] [address] [place], [place] [phone]  [facility]     (Inpatient and outpatient)     [address]           [phone]    [facility] [phone] (Suboxone and Methadone)  [address]      [place], [place] [address]      [phone]       [address] [place], [place] [phone]  [facility] (Outpatient/Inpatient, [facility])    (insurance only) [phone]             [facility] (Groups & Residential) [place], [place] [phone]     [facility]     [phone]  [address]     [place], [place]      [phone]       [facility] (for caregivers and family) [phone] [address] [place], [place] [phone]      Residential Treatment Programs [facility]      [address] [place], [place] [address]  [phone]       [facility] [address] [place], [place] [address] [phone]  [facility] [place]        [phone]       [facility] [phone]  [facility] ([facility]             [address]                                         [place], [place]                                                [phone] or [phone]                                [facility] [address] [place], [address] [phone]  [facility]    [address]      [place], [place]     [phone]       [facility] [address] [place], [place] [phone]  [facility]   [address] [place], [place] [address]     [phone]      Admissions: 8am-3pm M-F  [facility] ([facility]) [address] [place], [place] [phone]  [facility]: Residential Program [phone] Days)   [place], [place]      [phone] or [phone]     [facility]: [facility] [place], [place] (Walk in Hours over the weekend or by referral)  [facility] [address] [place], [place], [place] [address] [phone]  Crisis Mobile: [facility]:  [phone] (for crisis response 24 hours a day) [facility] Hotline:      2126008424 Outpatient Psychiatry and Counseling  [facility]: Mobile Crisis Management 24 hours:  [phone]  [facility] of the [employer] sliding scale fee and walk in schedule: M-F 8am-12pm/1pm-3pm [address]  [place], [place] [address] [phone]  [facility] [address] [place], [place] [address] [phone]  [facility] ([facility] [employer])- new patient walk-in appointments available Monday - Friday 8am -3pm.          [address] [place], [place] [address] [phone] or crisis line- [phone]  [facility] Outpatient Services/ Intensive Outpatient Therapy Program [address] [place], [place] [address] [phone]  [facility]                  Crisis Services      [phone] [address]     [place], [place] [address]                 [facility]   [facility] 802 659 5101. [address] [place], [place]  [address]   [employer]  [facility]          [address]  [place], [place] [address]       [phone]  [facility] [address] [place], [place] [address] [phone]  [facility]    [address]    [place], [place] [address]     [phone]       Andee Poles, MD     [address]     [place] [address]     [phone]       [facility] [address] [place] [address]  [facility]     [address] [place], [place]      [phone]       [facility] Eulogio Ditch, MD [address] [place], [place] [address] [phone]  [facility]     [address]     [place], [place] [address]     [phone]       [facility] [address] [place], [place] [address] [phone] Resources for Temporary Residential Assistance/Crisis Centers  DAY CENTERS [facility]) M-F 8am-3pm   [address] [place], [place] [address]   [phone] Services include: laundry, barbering, support groups, case management, phone  & computer access, showers, AA/NA mtgs, mental health/substance abuse nurse, job skills class, disability information, VA assistance, spiritual classes, etc.   HOMELESS SHELTERS  [facility]     [facility]   [address], [place]     [phone]              [employer] (women and children)       [address] [place], [place] [address] [phone] [email or web address] for application and process Application Required  [facility] [employer] [facility]   [address]    [place] [address]     [phone]                    [facility] [place] [address] [place], [place] [address] [phone] application appt.) Application Required  [facility]  (women only)    [address]     [place], [place] [address]     [phone]      Intake starts 6pm daily Need valid ID, SSC, & Police report [employer] [address] [place], [place] [phone] Application Required  [employer] (men only)     [address]      [place], [place]     [phone]       [facility] [place] (Pregnant women only) [address] [place], [place] [phone]  [facility]      [address]      [place], [place] [address]     [phone]             [facility] [address] [place], [place] [phone] 90 day commitment/SA/Application process  [facility](men only)     [address]     [place], [place]     [phone]       Check-in at [facility] [address] [place], [place] [address]  [phone] Men/Women/Women and Children must be there by 7 pm  [facility] [place], [place] [phone]

## 2015-01-07 ENCOUNTER — Other Ambulatory Visit: Payer: Self-pay | Admitting: Emergency Medicine

## 2015-02-01 ENCOUNTER — Encounter: Payer: Self-pay | Admitting: Emergency Medicine

## 2015-03-01 ENCOUNTER — Other Ambulatory Visit: Payer: Self-pay | Admitting: Neurology

## 2015-03-20 ENCOUNTER — Emergency Department (HOSPITAL_COMMUNITY)
Admission: EM | Admit: 2015-03-20 | Discharge: 2015-03-20 | Disposition: A | Discharge status: Home / Self Care | Payer: BLUE CROSS/BLUE SHIELD | Attending: Emergency Medicine | Admitting: Emergency Medicine

## 2015-03-20 ENCOUNTER — Encounter (HOSPITAL_COMMUNITY): Payer: Self-pay | Admitting: Emergency Medicine

## 2015-03-20 DIAGNOSIS — R000 Tachycardia, unspecified: Secondary | ICD-10-CM | POA: Diagnosis not present

## 2015-03-20 DIAGNOSIS — F419 Anxiety disorder, unspecified: Secondary | ICD-10-CM | POA: Diagnosis not present

## 2015-03-20 DIAGNOSIS — Z862 Personal history of diseases of the blood and blood-forming organs and certain disorders involving the immune mechanism: Secondary | ICD-10-CM | POA: Diagnosis not present

## 2015-03-20 DIAGNOSIS — G40909 Epilepsy, unspecified, not intractable, without status epilepticus: Secondary | ICD-10-CM | POA: Insufficient documentation

## 2015-03-20 DIAGNOSIS — Z79899 Other long term (current) drug therapy: Secondary | ICD-10-CM | POA: Diagnosis not present

## 2015-03-20 DIAGNOSIS — F10920 Alcohol use, unspecified with intoxication, uncomplicated: Secondary | ICD-10-CM

## 2015-03-20 DIAGNOSIS — Z8619 Personal history of other infectious and parasitic diseases: Secondary | ICD-10-CM | POA: Insufficient documentation

## 2015-03-20 DIAGNOSIS — F10120 Alcohol abuse with intoxication, uncomplicated: Secondary | ICD-10-CM | POA: Insufficient documentation

## 2015-03-20 DIAGNOSIS — K219 Gastro-esophageal reflux disease without esophagitis: Secondary | ICD-10-CM | POA: Diagnosis not present

## 2015-03-20 LAB — CBC WITH DIFFERENTIAL/PLATELET
Basophils Absolute: 0.1 10*3/uL (ref 0.0–0.1)
Basophils Relative: 1 %
Eosinophils Absolute: 0.1 10*3/uL (ref 0.0–0.7)
Eosinophils Relative: 2 %
HEMATOCRIT: 40.3 % (ref 39.0–52.0)
HEMOGLOBIN: 14.3 g/dL (ref 13.0–17.0)
LYMPHS ABS: 2.8 10*3/uL (ref 0.7–4.0)
Lymphocytes Relative: 46 %
MCH: 34.6 pg — AB (ref 26.0–34.0)
MCHC: 35.5 g/dL (ref 30.0–36.0)
MCV: 97.6 fL (ref 78.0–100.0)
MONOS PCT: 10 %
Monocytes Absolute: 0.6 10*3/uL (ref 0.1–1.0)
NEUTROS ABS: 2.6 10*3/uL (ref 1.7–7.7)
NEUTROS PCT: 41 %
Platelets: 205 10*3/uL (ref 150–400)
RBC: 4.13 MIL/uL — ABNORMAL LOW (ref 4.22–5.81)
RDW: 13.1 % (ref 11.5–15.5)
WBC: 6.2 10*3/uL (ref 4.0–10.5)

## 2015-03-20 LAB — COMPREHENSIVE METABOLIC PANEL
ALBUMIN: 4.6 g/dL (ref 3.5–5.0)
ALK PHOS: 96 U/L (ref 38–126)
ALT: 160 U/L — AB (ref 17–63)
AST: 174 U/L — ABNORMAL HIGH (ref 15–41)
Anion gap: 11 (ref 5–15)
BILIRUBIN TOTAL: 0.8 mg/dL (ref 0.3–1.2)
BUN: 12 mg/dL (ref 6–20)
CO2: 25 mmol/L (ref 22–32)
CREATININE: 0.68 mg/dL (ref 0.61–1.24)
Calcium: 8.8 mg/dL — ABNORMAL LOW (ref 8.9–10.3)
Chloride: 104 mmol/L (ref 101–111)
GFR calc Af Amer: >60 mL/min (ref >60)
Glucose, Bld: 269 mg/dL — ABNORMAL HIGH (ref 65–99)
Potassium: 3.8 mmol/L (ref 3.5–5.1)
Sodium: 140 mmol/L (ref 135–145)
TOTAL PROTEIN: 7.8 g/dL (ref 6.5–8.1)

## 2015-03-20 LAB — ETHANOL: ALCOHOL ETHYL (B): 506 mg/dL — AB (ref <5)

## 2015-03-20 LAB — RAPID URINE DRUG SCREEN, HOSP PERFORMED
AMPHETAMINES: NOT DETECTED
BARBITURATES: NOT DETECTED
Benzodiazepines: NOT DETECTED
Cocaine: NOT DETECTED
Opiates: NOT DETECTED
TETRAHYDROCANNABINOL: NOT DETECTED

## 2015-03-20 LAB — AMMONIA: Ammonia: 29 umol/L (ref 9–35)

## 2015-03-20 MED ORDER — LEVETIRACETAM 750 MG PO TABS
1500.0000 mg | ORAL_TABLET | Freq: Once | ORAL | Status: AC
  Administered 2015-03-20: 1500 mg via ORAL
  Filled 2015-03-20: qty 2

## 2015-03-20 MED ORDER — SODIUM CHLORIDE 0.9 % IV BOLUS (SEPSIS)
500.0000 mL | Freq: Once | INTRAVENOUS | Status: AC
  Administered 2015-03-20: 500 mL via INTRAVENOUS

## 2015-03-20 NOTE — ED Notes (Signed)
Pt reports drinking 3 times a month.  He denies drinking to excess or that his drinking is causing him trouble but he does state that he needs to quit drinking.

## 2015-03-20 NOTE — ED Notes (Signed)
Pts father called in to ED for information on Pt.  Pt gave RN permission to share all information with his father, also Suezanne JacquetRobert Burgher.  RN contacted Pts father and updated him on Pt condition.

## 2015-03-20 NOTE — ED Notes (Addendum)
Pt states that he had seizure earlier today. And denies any ETOH use.

## 2015-03-20 NOTE — ED Provider Notes (Signed)
CSN: [id number]     Arrival date & time 03/20/15  1626 History   First MD Initiated Contact with Patient 03/20/15 1639     Chief Complaint  Patient presents with  . Alcohol Intoxication  . seizure?      Level V caveat due to altered mental status Patient is a 29 y.o. male presenting with intoxication. The history is provided by the patient.  Alcohol Intoxication   patient was brought in for altered mental status. Patient states he thinks he had a seizure. States he's been taking his medicine and doesn't know why would've had a seizure again. States he has been drinking some alcohol, but not today. Previous history of seizures. Previous history of alcoholic liver disease. Appears mildly confused this time.  Past Medical History  Diagnosis Date  . Seizure disorder (HCC)   . Seizures (HCC)   . Allergy   . Anxiety   . Hepatitis   . GERD (gastroesophageal reflux disease)   . Pancreatitis   . Thrombocytopenia (HCC)   . Alcoholic liver disease (HCC)   . Transaminitis    Past Surgical History  Procedure Laterality Date  . Wisdom tooth extraction     Family History  Problem Relation Age of Onset  . Heart disease      cardiovascular disease  . Cancer Paternal Grandmother     lung  . Hypertension Father    Social History  Substance Use Topics  . Smoking status: Never Smoker   . Smokeless tobacco: Never Used  . Alcohol Use: No     Comment: no EtOH since 01/13/2013    Review of Systems  Unable to perform ROS: Mental status change      Allergies  Oxycodone  Home Medications   Prior to Admission medications   Medication Sig Start Date End Date Taking? Authorizing Provider  levETIRAcetam (KEPPRA) 1000 MG tablet Take 1-1/2 tablets twice a day Patient taking differently: Take 1,500 mg by mouth 2 (two) times daily.  10/21/14  Yes Van Clines, MD  ibuprofen (ADVIL,MOTRIN) 600 MG tablet Take 1 tablet (600 mg total) by mouth every 6 (six) hours as needed. Patient not  taking: Reported on 03/20/2015 01/03/15   Elpidio Anis, PA-C  omeprazole (PRILOSEC) 20 MG capsule Take 1 capsule (20 mg total) by mouth daily. Patient not taking: Reported on 01/03/2015 11/27/14   Fayrene Helper, PA-C  ondansetron (ZOFRAN ODT) 4 MG disintegrating tablet Take 1 tablet (4 mg total) by mouth every 8 (eight) hours as needed for nausea or vomiting. Patient not taking: Reported on 01/03/2015 12/15/14   Collene Gobble, MD  potassium chloride SA (K-DUR,KLOR-CON) 20 MEQ tablet TAKE 1 TABLET (20 MEQ TOTAL) BY MOUTH DAILY. Patient not taking: Reported on 01/03/2015 12/29/14   Collene Gobble, MD  ranitidine (ZANTAC) 150 MG capsule Take 1 capsule (150 mg total) by mouth daily. Patient not taking: Reported on 01/03/2015 11/27/14   Fayrene Helper, PA-C   BP 117/81 mmHg  Pulse 107  Temp(Src) 98.1 F (36.7 C) (Oral)  Resp 25  SpO2 93% Physical Exam  Constitutional: He appears well-developed.  HENT:  No trauma to tongue  Eyes: Pupils are equal, round, and reactive to light.  Neck: Neck supple.  Cardiovascular:  Mild tachycardia  Pulmonary/Chest: Effort normal.  Abdominal: Soft. There is no tenderness.  Musculoskeletal: Normal range of motion.  Neurological: He is alert.  Mild confusion and a little slow to answer.  Skin: Skin is warm.    ED Course  Procedures (including critical care time) Labs Review Labs Reviewed  COMPREHENSIVE METABOLIC PANEL - Abnormal; Notable for the following:    Glucose, Bld 269 (*)    Calcium 8.8 (*)    AST 174 (*)    ALT 160 (*)    All other components within normal limits  ETHANOL - Abnormal; Notable for the following:    Alcohol, Ethyl (B) 506 (*)    All other components within normal limits  CBC WITH DIFFERENTIAL/PLATELET - Abnormal; Notable for the following:    RBC 4.13 (*)    MCH 34.6 (*)    All other components within normal limits  URINE RAPID DRUG SCREEN, HOSP PERFORMED  [place]    Imaging Review No results found. I have personally reviewed and  evaluated these images and lab results as part of my medical decision-making.   EKG Interpretation None      MDM   Final diagnoses:  Alcohol intoxication, uncomplicated (HCC)    Patient with alcohol abuse and question of seizure. Patient initially told me he had not drank but later when he was more with it he said that he had been drinking heavily. He states he does not know if he had a seizure but he has been taking his medicine as a shared. At this point does not think we change his medicines. He is awake and appropriate. He is able answer questions and ambulate without difficulty. Will discharge home. Patient states he has a resources in order to attempt to quit drinking.    Benjiman CoreNathan Kasi Lasky, MD 03/20/15 [phone]

## 2015-03-20 NOTE — Discharge Instructions (Signed)
Alcohol Intoxication  Alcohol intoxication occurs when the amount of alcohol that a person has consumed impairs his or her ability to mentally and physically function. Alcohol directly impairs the normal chemical activity of the brain. Drinking large amounts of alcohol can lead to changes in mental function and behavior, and it can cause many physical effects that can be harmful.   Alcohol intoxication can range in severity from mild to very severe. Various factors can affect the level of intoxication that occurs, such as the person's age, gender, weight, frequency of alcohol consumption, and the presence of other medical conditions (such as diabetes, seizures, or heart conditions). Dangerous levels of alcohol intoxication may occur when people drink large amounts of alcohol in a short period (binge drinking). Alcohol can also be especially dangerous when combined with certain prescription medicines or "recreational" drugs.  SIGNS AND SYMPTOMS  Some common signs and symptoms of mild alcohol intoxication include:  · Loss of coordination.  · Changes in mood and behavior.  · Impaired judgment.  · Slurred speech.  As alcohol intoxication progresses to more severe levels, other signs and symptoms will appear. These may include:  · Vomiting.  · Confusion and impaired memory.  · Slowed breathing.  · Seizures.  · Loss of consciousness.  DIAGNOSIS   Your health care provider will take a medical history and perform a physical exam. You will be asked about the amount and type of alcohol you have consumed. Blood tests will be done to measure the concentration of alcohol in your blood. In many places, your blood alcohol level must be lower than 80 mg/dL (0.08%) to legally drive. However, many dangerous effects of alcohol can occur at much lower levels.   TREATMENT   People with alcohol intoxication often do not require treatment. Most of the effects of alcohol intoxication are temporary, and they go away as the alcohol naturally  leaves the body. Your health care provider will monitor your condition until you are stable enough to go home. Fluids are sometimes given through an IV access tube to help prevent dehydration.   HOME CARE INSTRUCTIONS  · Do not drive after drinking alcohol.  · Stay hydrated. Drink enough water and fluids to keep your urine clear or pale yellow. Avoid caffeine.    · Only take over-the-counter or prescription medicines as directed by your health care provider.    SEEK MEDICAL CARE IF:   · You have persistent vomiting.    · You do not feel better after a few days.  · You have frequent alcohol intoxication. Your health care provider can help determine if you should see a substance use treatment counselor.  SEEK IMMEDIATE MEDICAL CARE IF:   · You become shaky or tremble when you try to stop drinking.    · You shake uncontrollably (seizure).    · You throw up (vomit) blood. This may be bright red or may look like black coffee grounds.    · You have blood in your stool. This may be bright red or may appear as a black, tarry, bad smelling stool.    · You become lightheaded or faint.    MAKE SURE YOU:   · Understand these instructions.  · Will watch your condition.  · Will get help right away if you are not doing well or get worse.     This information is not intended to replace advice given to you by your health care provider. Make sure you discuss any questions you have with your health care provider.       Document Released: 01/24/2005 Document Revised: 12/17/2012 Document Reviewed: 09/19/2012  Elsevier Interactive Patient Education ©2016 [employer]    Alcohol Use Disorder  Alcohol use disorder is a mental disorder. It is not a one-time incident of heavy drinking. Alcohol use disorder is the excessive and uncontrollable use of alcohol over time that leads to problems with functioning in one or more areas of daily living. People with this disorder risk harming themselves and others when they drink to excess. Alcohol use  disorder also can cause other mental disorders, such as mood and anxiety disorders, and serious physical problems. People with alcohol use disorder often misuse other drugs.   Alcohol use disorder is common and widespread. Some people with this disorder drink alcohol to cope with or escape from negative life events. Others drink to relieve chronic pain or symptoms of mental illness. People with a family history of alcohol use disorder are at higher risk of losing control and using alcohol to excess.   Drinking too much alcohol can cause injury, accidents, and health problems. One drink can be too much when you are:  · Working.  · Pregnant or breastfeeding.  · Taking medicines. Ask your doctor.  · Driving or planning to drive.  SYMPTOMS   Signs and symptoms of alcohol use disorder may include the following:   · Consumption of alcohol in larger amounts or over a longer period of time than intended.  · Multiple unsuccessful attempts to cut down or control alcohol use.    · A great deal of time spent obtaining alcohol, using alcohol, or recovering from the effects of alcohol (hangover).  · A strong desire or urge to use alcohol (cravings).    · Continued use of alcohol despite problems at work, school, or home because of alcohol use.    · Continued use of alcohol despite problems in relationships because of alcohol use.  · Continued use of alcohol in situations when it is physically hazardous, such as driving a car.  · Continued use of alcohol despite awareness of a physical or psychological problem that is likely related to alcohol use. Physical problems related to alcohol use can involve the brain, heart, liver, stomach, and intestines. Psychological problems related to alcohol use include intoxication, depression, anxiety, psychosis, delirium, and dementia.    · The need for increased amounts of alcohol to achieve the same desired effect, or a decreased effect from the consumption of the same amount of alcohol  (tolerance).  · Withdrawal symptoms upon reducing or stopping alcohol use, or alcohol use to reduce or avoid withdrawal symptoms. Withdrawal symptoms include:  ¨ Racing heart.  ¨ Hand tremor.  ¨ Difficulty sleeping.  ¨ Nausea.  ¨ Vomiting.  ¨ Hallucinations.  ¨ Restlessness.  ¨ Seizures.  DIAGNOSIS  Alcohol use disorder is diagnosed through an assessment by your health care provider. Your health care provider may start by asking three or four questions to screen for excessive or problematic alcohol use. To confirm a diagnosis of alcohol use disorder, at least two symptoms must be present within a 12-month period. The severity of alcohol use disorder depends on the number of symptoms:  · Mild--two or three.  · Moderate--four or five.  · Severe--six or more.  Your health care provider may perform a physical exam or use results from lab tests to see if you have physical problems resulting from alcohol use. Your health care provider may refer you to a mental health professional for evaluation.  TREATMENT   Some people with alcohol use disorder are   able to reduce their alcohol use to low-risk levels. Some people with alcohol use disorder need to quit drinking alcohol. When necessary, mental health professionals with specialized training in substance use treatment can help. Your health care provider can help you decide how severe your alcohol use disorder is and what type of treatment you need. The following forms of treatment are available:   · Detoxification. Detoxification involves the use of prescription medicines to prevent alcohol withdrawal symptoms in the first week after quitting. This is important for people with a history of symptoms of withdrawal and for heavy drinkers who are likely to have withdrawal symptoms. Alcohol withdrawal can be dangerous and, in severe cases, cause death. Detoxification is usually provided in a hospital or in-patient substance use treatment facility.  · Counseling or talk therapy.  Talk therapy is provided by substance use treatment counselors. It addresses the reasons people use alcohol and ways to keep them from drinking again. The goals of talk therapy are to help people with alcohol use disorder find healthy activities and ways to cope with life stress, to identify and avoid triggers for alcohol use, and to handle cravings, which can cause relapse.  · Medicines. Different medicines can help treat alcohol use disorder through the following actions:    Decrease alcohol cravings.    Decrease the positive reward response felt from alcohol use.    Produce an uncomfortable physical reaction when alcohol is used (aversion therapy).  · Support groups. Support groups are run by people who have quit drinking. They provide emotional support, advice, and guidance.  These forms of treatment are often combined. Some people with alcohol use disorder benefit from intensive combination treatment provided by specialized substance use treatment centers. Both inpatient and outpatient treatment programs are available.     This information is not intended to replace advice given to you by your health care provider. Make sure you discuss any questions you have with your health care provider.     Document Released: 05/24/2004 Document Revised: 05/07/2014 Document Reviewed: 07/24/2012  Elsevier Interactive Patient Education ©2016 [employer]

## 2015-03-20 NOTE — ED Notes (Signed)
MD at bedside. 

## 2015-03-20 NOTE — ED Notes (Addendum)
RN called to Pts room and he requested RN to speak with a male (he said was his mother) on his cell phone.  Pt wanted RN to share all details of care and  his results.  RN updated Pts mother as requested by Pt.  Pts mother asked RN to keep Pt overnight and to get him placed in ETOH treatment.  RN explained that this will most likely be outpatient in nature.

## 2015-03-20 NOTE — ED Notes (Signed)
Pt is a poor historian.  He at first states the he took his Keppra this am then he states that he is unsure.

## 2015-03-20 NOTE — ED Notes (Signed)
Per EMS pt was picked in the parking lot leaning against flag pole with unsteady gait with smell of alcohol. Pt has no complaints. Vitals 130/80, 90HR, 18R, CBG 234.

## 2015-05-16 ENCOUNTER — Telehealth: Payer: Self-pay | Admitting: Neurology

## 2015-05-16 NOTE — Telephone Encounter (Signed)
Pt states that he would like to talk to someone about him having 2 seizures please call [phone]

## 2015-05-16 NOTE — Telephone Encounter (Signed)
Called patient. Lmovm advising of below.

## 2015-05-16 NOTE — Telephone Encounter (Signed)
I spoke with patient he states he had 2 seizures on Saturday. He states that he hasn't missed any doses of his Keppra. I asked about alcohol, he states that he didn't have any this past weekend. He states that he did start a new job and his hours have been different that what he 's used to. I did tell him that he is due for a f/u visit as his last 2 f/u office visits were cancelled. I asked if he had any availability this week he states that he would check his schedule and call me back . He states that he feels ok now, just some soreness. Any advisement?

## 2015-05-16 NOTE — Telephone Encounter (Signed)
No changes for now, I'll see him on Thurs

## 2015-05-19 ENCOUNTER — Encounter: Payer: Self-pay | Admitting: Neurology

## 2015-05-19 ENCOUNTER — Ambulatory Visit (INDEPENDENT_AMBULATORY_CARE_PROVIDER_SITE_OTHER): Payer: BLUE CROSS/BLUE SHIELD | Admitting: Neurology

## 2015-05-19 VITALS — BP 128/92 | HR 117 | Resp 16 | Wt 155.0 lb

## 2015-05-19 DIAGNOSIS — K7010 Alcoholic hepatitis without ascites: Secondary | ICD-10-CM | POA: Diagnosis not present

## 2015-05-19 DIAGNOSIS — G40309 Generalized idiopathic epilepsy and epileptic syndromes, not intractable, without status epilepticus: Secondary | ICD-10-CM | POA: Diagnosis not present

## 2015-05-19 DIAGNOSIS — G4700 Insomnia, unspecified: Secondary | ICD-10-CM | POA: Diagnosis not present

## 2015-05-19 NOTE — Patient Instructions (Signed)
1. Schedule MRI brain with and without contrast 2. Continue Keppra  twice a day 3. We will contact you on medication for sleep that would not significantly affect liver 4. As per [place] driving laws, no driving after a seizure until 6 months seizure-free 5. Follow-up in 2 months, call for any changes  Seizure Precautions: 1. If medication has been prescribed for you to prevent seizures, take it exactly as directed.  Do not stop taking the medicine without talking to your doctor first, even if you have not had a seizure in a long time.   2. Avoid activities in which a seizure would cause danger to yourself or to others.  Don't operate dangerous machinery, swim alone, or climb in high or dangerous places, such as on ladders, roofs, or girders.  Do not drive unless your doctor says you may.  3. If you have any warning that you may have a seizure, lay down in a safe place where you can't hurt yourself.    4.  No driving for 6 months from last seizure, as per [facility].   Please refer to the following link on the [employer]'s website for more information: http://www.epilepsyfoundation.org/answerplace/Social/driving/drivingu.cfm   5.  Maintain good sleep hygiene. Avoid alcohol.  6.  Contact your doctor if you have any problems that may be related to the medicine you are taking.  7.  Call 911 and bring the patient back to the ED if:        A.  The seizure lasts longer than 5 minutes.       B.  The patient doesn't awaken shortly after the seizure  C.  The patient has new problems such as difficulty seeing, speaking or moving  D.  The patient was injured during the seizure  E.  The patient has a temperature over 102 F (39C)  F.  The patient vomited and now is having trouble breathing

## 2015-05-19 NOTE — Progress Notes (Signed)
NEUROLOGY FOLLOW UP OFFICE NOTE  Eric Mueller [id number]  HISTORY OF PRESENT ILLNESS: I had the pleasure of seeing Eric Mueller in follow-up in the neurology clinic on 05/19/2015.  The patient was last seen 7 months ago for seizures. Records and images were personally reviewed where available.  His routine wake and sleep EEG was normal. Since his last visit, he reports 2 seizures in one day on 05/14/15, but does not recall any other seizures since last Spring. I reminded him of an ER visit on 01/03/15 where it was thought he had an unwitnessed seizure the night prior, he was found on the ground confused. He reported last alcohol intake was 2 weeks prior. Alcohol level <5. He had another visit on 03/20/15 for altered mental status, he thought he had a seizure and was initially confused in the ER. Later on, he reported he had been drinking heavily. Alcohol level 506.    He reports that the seizure last weekend occurred at work, he started feeling disoriented and felt like he was seeing something out of the corner of his left side. He told co-workers he was not feeling well, he recalls calling his mother, then waking up to see paramedics around him. He had a witnessed seizure at work and went home. Around 6-7 hours later while at home, he had another seizure. He denied missing medications. He states he drinks alcohol on and off, last intake was 2 weeks ago. He has been at a new job for 2 months where he would be sleep-deprived, coming in at 4am or 5am. He has had difficulty initiating sleep and has tried melatonin with no effect. He is taking Keppra  BID with no significant side effects except for some fatigue. He continues to have some muscle soreness and had bitten the inside of his cheek.  Laboratory Data: Lab Results  Component Value Date   WBC 6.2 03/20/2015   HGB 14.3 03/20/2015   HCT 40.3 03/20/2015   MCV 97.6 03/20/2015   PLT 205 03/20/2015     Chemistry      Component Value  Date/Time   NA 140 03/20/2015 1710   K 3.8 03/20/2015 1710   CL 104 03/20/2015 1710   CO2 25 03/20/2015 1710   BUN 12 03/20/2015 1710   CREATININE 0.68 03/20/2015 1710   CREATININE 0.74 10/06/2014 0934      Component Value Date/Time   CALCIUM 8.8* 03/20/2015 1710   ALKPHOS 96 03/20/2015 1710   AST 174* 03/20/2015 1710   ALT 160* 03/20/2015 1710   BILITOT 0.8 03/20/2015 1710       HPI 10/21/2014: This is a pleasant 30 yo RH man with a history of seizures since age 30. The first seizure occurred in the setting of alcohol use, he was drinking almost daily at that time. Following the first seizure, he reported reducing alcohol and going months without, during which seizures continued to occur. He was seen by a neurologist and was initially started on Keppra, with dose increased over the years, most recently at least a year ago to  BID. He occasionally has a warning of not feeling right or a quick flash on either the right or left visual field, followed by a loud yell then falling to the ground with tonic activity. Seizures usually last 2 minutes or so. He had bitten his tongue, denies bowel/bladder incontinence. He would feel confused after and has some memory issues for several hours, and feel extreme fatigue and soreness for up  to 2-3 days. He reports a total of 7-8 seizures, most recently last 10/19/14. Prior to this, his last seizure was in the winter of last year. He had been feeling generalized malaise and abdominal discomfort 2 days prior, with nausea and vomiting, unable to keep his medications down. He tried taking an additional Keppra dose, recalls having tingling in the last 3 digits of both hands before a witnessed seizure by his mother on 10/19/14 lasting a few minutes. He went to the ER, where he started having cramping and spasms in his hands and feet, he was aware of the cramps and felt very scared, diagnosed with severe carpopedal spasms in the ER. He was given Ativan and morphine,  with relief of symptoms. His CMP was abnormal with low potassium of 2.9 and magnesium 1.1. He was discharged home feeling overall better, and denies any further vomiting since then. He is now on potassium replacement, and has been able to keep Keppra 1500mg  BID down. He still feels a little out of sorts with difficulty swallowing and heartburn from the vomiting, overall no focal weakness/numbness/tingling, headaches, dizziness, diplopia, dysarthria, neck/back pain, bowel/bladder dysfunction. He has a chronic history of sleep problems, and had even less sleep when he was vomiting last week. He has had a history of bilateral hand tremors since age 30 or 64, diagnosed as ?essential tremor, and would notice worsening of shakiness after seizures.   He denies any staring/unresponsive episodes, gaps in time, olfactory/gustatory hallucinations, deja vu, rising epigastric sensation, focal numbness/tingling/weakness, myoclonic jerks. He reports his memory has always been a "B or C+," short-term memory is fine. He is followed by GI for alcoholic hepatitis and pancreatitis.  Epilepsy Risk Factors: A distant paternal cousin has seizures. Otherwise he had a normal birth and early development. There is no history of febrile convulsions, CNS infections such as meningitis/encephalitis, significant traumatic brain injury, neurosurgical procedures. He reports a concussion in high school playing lacrosse, no loss of consciousness.   EEGs: none available for review MRI: none available for review. I personally reviewed head CT done 10/2012 which did not show any acute changes.  PAST MEDICAL HISTORY: Past Medical History  Diagnosis Date  . Seizure disorder (HCC)   . Seizures (HCC)   . Allergy   . Anxiety   . Hepatitis   . GERD (gastroesophageal reflux disease)   . Pancreatitis   . Thrombocytopenia (HCC)   . Alcoholic liver disease (HCC)   . Transaminitis     MEDICATIONS: Current Outpatient Prescriptions on File  Prior to Visit  Medication Sig Dispense Refill  . ibuprofen (ADVIL,MOTRIN) 600 MG tablet Take 1 tablet (600 mg total) by mouth every 6 (six) hours as needed. (Patient not taking: Reported on 03/20/2015) 30 tablet 0  . levETIRAcetam (KEPPRA) 1000 MG tablet Take 1-1/2 tablets twice a day (Patient taking differently: Take 1,500 mg by mouth 2 (two) times daily. ) 270 tablet 3  . omeprazole (PRILOSEC) 20 MG capsule Take 1 capsule (20 mg total) by mouth daily. (Patient not taking: Reported on 01/03/2015) 30 capsule 0  . ondansetron (ZOFRAN ODT) 4 MG disintegrating tablet Take 1 tablet (4 mg total) by mouth every 8 (eight) hours as needed for nausea or vomiting. (Patient not taking: Reported on 01/03/2015) 12 tablet 4  . potassium chloride SA (K-DUR,KLOR-CON) 20 MEQ tablet TAKE 1 TABLET (20 MEQ TOTAL) BY MOUTH DAILY. (Patient not taking: Reported on 01/03/2015) 30 tablet 1  . ranitidine (ZANTAC) 150 MG capsule Take 1 capsule (150 mg  total) by mouth daily. (Patient not taking: Reported on 01/03/2015) 30 capsule 0   No current facility-administered medications on file prior to visit.    ALLERGIES: Allergies  Allergen Reactions  . Oxycodone Nausea And Vomiting    FAMILY HISTORY: Family History  Problem Relation Age of Onset  . Heart disease      cardiovascular disease  . Cancer Paternal Grandmother     lung  . Hypertension Father     SOCIAL HISTORY: Social History   Social History  . Marital Status: Single    Spouse Name: n/a  . Number of Children: 0  . Years of Education: N/A   Occupational History  . musician     session work; especially percussion  .     Social History Main Topics  . Smoking status: Never Smoker   . Smokeless tobacco: Never Used  . Alcohol Use: No     Comment: no EtOH since 01/13/2013  . Drug Use: No  . Sexual Activity:    Partners: Female   Other Topics Concern  . Not on file   Social History Narrative   Lives with his mother. Father lives nearby.    REVIEW  OF SYSTEMS: Constitutional: No fevers, chills, or sweats, no generalized fatigue, change in appetite Eyes: No visual changes, double vision, eye pain Ear, nose and throat: No hearing loss, ear pain, nasal congestion, sore throat Cardiovascular: No chest pain, palpitations Respiratory:  No shortness of breath at rest or with exertion, wheezes GastrointestinaI: No nausea, vomiting, diarrhea, abdominal pain, fecal incontinence Genitourinary:  No dysuria, urinary retention or frequency Musculoskeletal:  + neck pain, back pain Integumentary: No rash, pruritus, skin lesions Neurological: as above Psychiatric: No depression, insomnia, anxiety Endocrine: No palpitations, fatigue, diaphoresis, mood swings, change in appetite, change in weight, increased thirst Hematologic/Lymphatic:  No anemia, purpura, petechiae. Allergic/Immunologic: no itchy/runny eyes, nasal congestion, recent allergic reactions, rashes  PHYSICAL EXAM: Filed Vitals:   05/19/15 1149  BP: 128/92  Pulse: 117  Resp: 16   General: No acute distress Head:  Normocephalic/atraumatic Neck: supple, no paraspinal tenderness, full range of motion Heart:  Regular rate and rhythm Lungs:  Clear to auscultation bilaterally Back: No paraspinal tenderness Skin/Extremities: No rash, no edema Neurological Exam: alert and oriented to person, place, and time. No aphasia or dysarthria. Fund of knowledge is appropriate.  Recent and remote memory are intact.  Attention and concentration are normal.    Able to name objects and repeat phrases. Cranial nerves: Pupils equal, round, reactive to light.  Extraocular movements intact with no nystagmus. Visual fields full. Facial sensation intact. No facial asymmetry. Tongue, uvula, palate midline.  Motor: Bulk and tone normal, muscle strength 5/5 throughout with no pronator drift.  Sensation to light touch, temperature and vibration intact.  No extinction to double simultaneous stimulation.  Deep tendon  reflexes 2+ throughout, toes downgoing.  Finger to nose testing intact.  Gait narrow-based and steady, able to tandem walk adequately.  Romberg negative.  IMPRESSION: This is a pleasant 30 yo RH man with a history of alcoholic hepatitis, pancreatitis, and recurrent seizures since age 31. Initially seizures occurred in the setting of alcohol use, however he denies alcohol use with some seizures. He had 2 seizures on 05/13/14, last alcohol intake was 2 weeks prior. He reports sleep-deprivation. He reports seizure prior to this was in the Spring, however there are ER visits in September and November for confusion and possible seizure, alcohol level in November was 506. We again  discussed effects on alcohol on seizures. This does not appear to be the case with most recent seizure, which appears to have been provoked by sleep deprivation. His EEG is normal, he has not had an MRI brain in the past. MRI brain with and without contrast seizure protocol will be ordered to further classify his seizures. We discussed continuation of Keppra  BID for now. We discussed other seizure medication options, considerations for AEDs that are not extensively metabolized by the liver include Gabapentin, Topamax. His main concern is sleep difficulties, we will look into sleep aids that would not worsen liver dysfunction and have this cleared with his GI specialist first. He is aware of [place] driving laws to stop driving after a seizure, until 6 months seizure-free. He will follow-up in 2 months and knows to call for any changes.   Thank you for allowing me to participate in his care.  Please do not hesitate to call for any questions or concerns.  The duration of this appointment visit was 25 minutes of face-to-face time with the patient.  Greater than 50% of this time was spent in counseling, explanation of diagnosis, planning of further management, and coordination of care.   Patrcia Dolly, M.D.   CC: Dr. Cleta Alberts, Dr.  Elnoria Howard

## 2015-05-23 ENCOUNTER — Encounter: Payer: Self-pay | Admitting: Neurology

## 2015-05-23 DIAGNOSIS — G4700 Insomnia, unspecified: Secondary | ICD-10-CM | POA: Insufficient documentation

## 2015-05-25 ENCOUNTER — Telehealth: Payer: Self-pay | Admitting: Family Medicine

## 2015-05-25 MED ORDER — HYDROXYZINE HCL 25 MG PO TABS: 25.0000 mg | ORAL_TABLET | Freq: Every evening | ORAL | Status: AC | PRN

## 2015-05-25 NOTE — Telephone Encounter (Signed)
-----   Message from Van Clines, MD sent at 05/25/2015 11:28 AM EST ----- Pls let patient know I communicated with Dr. Elnoria Howard, he is okay with patient starting hydroxyzine  qhs prn to help with sleep. Let's try low dose first. Pls send Rx for #20 tabs with 3 refills, thanks

## 2015-05-25 NOTE — Telephone Encounter (Signed)
I spoke with patient her is agreeable to try medication. Will send Rx to his pharmacy. I did advise him to call us if he had any further questions or problems with medication.

## 2015-05-31 ENCOUNTER — Ambulatory Visit (HOSPITAL_COMMUNITY): Admission: RE | Admit: 2015-05-31 | Payer: BLUE CROSS/BLUE SHIELD | Source: Ambulatory Visit

## 2015-07-21 ENCOUNTER — Telehealth: Payer: Self-pay | Admitting: Neurology

## 2015-07-21 NOTE — Telephone Encounter (Signed)
Do you have any recommendations?

## 2015-07-21 NOTE — Telephone Encounter (Signed)
Pt has moved and would like a referral to someone in the [place] area please call [phone]

## 2015-07-22 NOTE — Telephone Encounter (Signed)
Spoke with patient notified him of advisement. Advised him once he found a new doctor to have that office send us a records release and we would send his records to them.

## 2015-07-22 NOTE — Telephone Encounter (Signed)
Pls let him know I don't know them personally, but he can contact [facility] or [facility] for Neurology groups in that area. Thanks

## 2015-07-25 ENCOUNTER — Ambulatory Visit: Payer: Self-pay | Admitting: Neurology

## 2015-10-31 ENCOUNTER — Telehealth: Payer: Self-pay | Admitting: Neurology

## 2015-10-31 NOTE — Telephone Encounter (Signed)
VM-PT left a message in regards to a medication and side effects/Dawn CB# [phone]

## 2015-10-31 NOTE — Telephone Encounter (Signed)
Patient had drug test for new job and had levels of dilaudid and morphine show up in system. He wanted to make sure this would not come from Keppra. Made him aware this would not.

## 2015-11-13 ENCOUNTER — Other Ambulatory Visit: Payer: Self-pay | Admitting: Neurology

## 2016-04-04 ENCOUNTER — Other Ambulatory Visit: Payer: Self-pay | Admitting: Neurology

## 2016-04-05 NOTE — Telephone Encounter (Signed)
RX sent to pharmacy, per last OV patient to continue.  

## 2016-11-13 ENCOUNTER — Other Ambulatory Visit: Payer: Self-pay | Admitting: Neurology

## 2016-11-16 ENCOUNTER — Other Ambulatory Visit: Payer: Self-pay | Admitting: Neurology

## 2016-11-22 ENCOUNTER — Other Ambulatory Visit: Payer: Self-pay

## 2017-08-31 ENCOUNTER — Other Ambulatory Visit: Payer: Self-pay | Admitting: Neurology

## 2017-11-28 ENCOUNTER — Other Ambulatory Visit: Payer: Self-pay | Admitting: Neurology

## 2018-01-14 ENCOUNTER — Other Ambulatory Visit: Payer: Self-pay | Admitting: Neurology

## 2018-01-23 ENCOUNTER — Other Ambulatory Visit: Payer: Self-pay | Admitting: Neurology

## 2018-01-29 ENCOUNTER — Telehealth: Payer: Self-pay | Admitting: Neurology

## 2018-01-29 MED ORDER — LEVETIRACETAM 1000 MG PO TABS: ORAL_TABLET | ORAL | 0 refills | Status: AC

## 2018-01-29 NOTE — Telephone Encounter (Signed)
Spoke with pt.  Advised that he has not seen Dr. Karel Jarvis in almost 3 years.  For medication management pt's need to be seen minimum once per year.    Verbal per Dr. Karel Jarvis - ask how he has been getting his medication.  We have not sent Rx sent December 2017.  OK to send 30 day supply.  He can get refills from a new neurologist or his PCP

## 2018-01-29 NOTE — Telephone Encounter (Signed)
Spoke with pt.  He states that he is unsure how he was able to get his refills, just that he requested them through his pharmacy and they were always filled.  (I'm assuing there was some overlapping with prescribing) advised that I will send 30 day supply but that pt will need to get medications via his PCP or better yet, get established with a new Neurologist.  Pt states that he has been procrastinating because he was unsure if his move to [facility] was going to be permanent or not.

## 2018-01-29 NOTE — Telephone Encounter (Signed)
Patient called in stating he needs a refill sent into the [facility] [address] in [place]. The medication name he couldn't remember but he said it was the seizure meds for the generic Keppra. If you need to call him back it's [phone]. Thanks!

## 2018-01-29 NOTE — Telephone Encounter (Signed)
Rx for Levetiracetam 1000mg  #180 with no refills  Sig - take 1.5 tabs BID  [facility] [address] in [place].

## 2018-01-29 NOTE — Addendum Note (Signed)
Addended by: Horatio Pel on: 01/29/2018 10:55 AM   Modules accepted: Orders

## 2018-03-23 ENCOUNTER — Other Ambulatory Visit: Payer: Self-pay | Admitting: Neurology

## 2019-10-29 DEATH — deceased
